# Patient Record
Sex: Female | Born: 1965 | Race: Black or African American | Hispanic: No | Smoking: Never smoker
Health system: Southern US, Community
[De-identification: ages and names within clinical notes are randomized; demographics above are authoritative.]

## PROBLEM LIST (undated history)

## (undated) DIAGNOSIS — K589 Irritable bowel syndrome without diarrhea: Secondary | ICD-10-CM

## (undated) DIAGNOSIS — IMO0002 Reserved for concepts with insufficient information to code with codable children: Secondary | ICD-10-CM

## (undated) DIAGNOSIS — M329 Systemic lupus erythematosus, unspecified: Secondary | ICD-10-CM

## (undated) DIAGNOSIS — K219 Gastro-esophageal reflux disease without esophagitis: Secondary | ICD-10-CM

## (undated) DIAGNOSIS — D649 Anemia, unspecified: Secondary | ICD-10-CM

## (undated) DIAGNOSIS — M3500 Sicca syndrome, unspecified: Secondary | ICD-10-CM

## (undated) DIAGNOSIS — I7300 Raynaud's syndrome without gangrene: Secondary | ICD-10-CM

## (undated) DIAGNOSIS — F419 Anxiety disorder, unspecified: Secondary | ICD-10-CM

## (undated) DIAGNOSIS — B019 Varicella without complication: Secondary | ICD-10-CM

## (undated) HISTORY — DX: Anxiety disorder, unspecified: F41.9

## (undated) HISTORY — DX: Gastro-esophageal reflux disease without esophagitis: K21.9

## (undated) HISTORY — DX: Sjogren syndrome, unspecified: M35.00

## (undated) HISTORY — PX: BUNIONECTOMY: SHX129

## (undated) HISTORY — DX: Anemia, unspecified: D64.9

## (undated) HISTORY — DX: Raynaud's syndrome without gangrene: I73.00

## (undated) HISTORY — DX: Varicella without complication: B01.9

## (undated) HISTORY — DX: Reserved for concepts with insufficient information to code with codable children: IMO0002

## (undated) HISTORY — DX: Systemic lupus erythematosus, unspecified: M32.9

## (undated) HISTORY — DX: Irritable bowel syndrome, unspecified: K58.9

---

## 1997-08-01 ENCOUNTER — Other Ambulatory Visit: Admission: RE | Admit: 1997-08-01 | Discharge: 1997-08-01 | Payer: Self-pay | Admitting: Obstetrics and Gynecology

## 1997-08-29 ENCOUNTER — Ambulatory Visit (HOSPITAL_COMMUNITY): Admission: RE | Admit: 1997-08-29 | Discharge: 1997-08-29 | Payer: Self-pay | Admitting: Obstetrics and Gynecology

## 1997-11-14 ENCOUNTER — Other Ambulatory Visit: Admission: RE | Admit: 1997-11-14 | Discharge: 1997-11-14 | Payer: Self-pay | Admitting: Obstetrics and Gynecology

## 1998-01-22 ENCOUNTER — Inpatient Hospital Stay (HOSPITAL_COMMUNITY): Admission: AD | Admit: 1998-01-22 | Discharge: 1998-01-24 | Payer: Self-pay | Admitting: Obstetrics & Gynecology

## 1998-01-22 ENCOUNTER — Inpatient Hospital Stay (HOSPITAL_COMMUNITY): Admission: AD | Admit: 1998-01-22 | Discharge: 1998-01-22 | Payer: Self-pay | Admitting: Obstetrics and Gynecology

## 2000-12-04 ENCOUNTER — Encounter: Admission: RE | Admit: 2000-12-04 | Discharge: 2000-12-04 | Payer: Self-pay | Admitting: Family Medicine

## 2001-05-23 ENCOUNTER — Other Ambulatory Visit: Admission: RE | Admit: 2001-05-23 | Discharge: 2001-05-23 | Payer: Self-pay | Admitting: Family Medicine

## 2003-04-16 ENCOUNTER — Encounter: Admission: RE | Admit: 2003-04-16 | Discharge: 2003-04-16 | Payer: Self-pay | Admitting: Family Medicine

## 2004-04-18 DIAGNOSIS — M3500 Sicca syndrome, unspecified: Secondary | ICD-10-CM | POA: Insufficient documentation

## 2004-04-18 DIAGNOSIS — M329 Systemic lupus erythematosus, unspecified: Secondary | ICD-10-CM | POA: Insufficient documentation

## 2004-04-18 DIAGNOSIS — I7300 Raynaud's syndrome without gangrene: Secondary | ICD-10-CM | POA: Insufficient documentation

## 2004-04-18 HISTORY — PX: CHOLECYSTECTOMY: SHX55

## 2004-08-04 ENCOUNTER — Other Ambulatory Visit: Admission: RE | Admit: 2004-08-04 | Discharge: 2004-08-04 | Payer: Self-pay | Admitting: *Deleted

## 2004-09-17 ENCOUNTER — Encounter: Admission: RE | Admit: 2004-09-17 | Discharge: 2004-09-17 | Payer: Self-pay | Admitting: Family Medicine

## 2005-02-11 ENCOUNTER — Encounter (INDEPENDENT_AMBULATORY_CARE_PROVIDER_SITE_OTHER): Payer: Self-pay | Admitting: *Deleted

## 2005-02-11 ENCOUNTER — Ambulatory Visit (HOSPITAL_COMMUNITY): Admission: RE | Admit: 2005-02-11 | Discharge: 2005-02-12 | Payer: Self-pay | Admitting: General Surgery

## 2006-08-31 ENCOUNTER — Other Ambulatory Visit: Admission: RE | Admit: 2006-08-31 | Discharge: 2006-08-31 | Payer: Self-pay | Admitting: Family Medicine

## 2007-04-19 DIAGNOSIS — G4700 Insomnia, unspecified: Secondary | ICD-10-CM | POA: Insufficient documentation

## 2007-11-29 ENCOUNTER — Emergency Department (HOSPITAL_COMMUNITY): Admission: EM | Admit: 2007-11-29 | Discharge: 2007-11-29 | Payer: Self-pay | Admitting: Internal Medicine

## 2007-12-06 ENCOUNTER — Emergency Department (HOSPITAL_COMMUNITY): Admission: EM | Admit: 2007-12-06 | Discharge: 2007-12-07 | Payer: Self-pay | Admitting: Family Medicine

## 2008-03-05 ENCOUNTER — Other Ambulatory Visit: Admission: RE | Admit: 2008-03-05 | Discharge: 2008-03-05 | Payer: Self-pay | Admitting: Family Medicine

## 2008-03-19 ENCOUNTER — Encounter: Admission: RE | Admit: 2008-03-19 | Discharge: 2008-03-19 | Payer: Self-pay | Admitting: Family Medicine

## 2008-10-10 ENCOUNTER — Emergency Department (HOSPITAL_COMMUNITY): Admission: EM | Admit: 2008-10-10 | Discharge: 2008-10-10 | Payer: Self-pay | Admitting: Emergency Medicine

## 2009-04-24 ENCOUNTER — Encounter: Admission: RE | Admit: 2009-04-24 | Discharge: 2009-04-24 | Payer: Self-pay | Admitting: Family Medicine

## 2009-06-09 ENCOUNTER — Other Ambulatory Visit: Admission: RE | Admit: 2009-06-09 | Discharge: 2009-06-09 | Payer: Self-pay | Admitting: Family Medicine

## 2010-05-09 ENCOUNTER — Encounter: Payer: Self-pay | Admitting: Family Medicine

## 2010-05-14 ENCOUNTER — Encounter
Admission: RE | Admit: 2010-05-14 | Discharge: 2010-05-14 | Payer: Self-pay | Source: Home / Self Care | Attending: Family Medicine | Admitting: Family Medicine

## 2010-06-02 ENCOUNTER — Inpatient Hospital Stay (INDEPENDENT_AMBULATORY_CARE_PROVIDER_SITE_OTHER)
Admission: RE | Admit: 2010-06-02 | Discharge: 2010-06-02 | Disposition: A | Discharge status: Home / Self Care | Payer: Self-pay | Source: Ambulatory Visit | Attending: Emergency Medicine | Admitting: Emergency Medicine

## 2010-06-02 DIAGNOSIS — J069 Acute upper respiratory infection, unspecified: Secondary | ICD-10-CM

## 2010-06-23 ENCOUNTER — Other Ambulatory Visit (HOSPITAL_COMMUNITY)
Admission: RE | Admit: 2010-06-23 | Discharge: 2010-06-23 | Disposition: A | Discharge status: Home / Self Care | Payer: 59 | Source: Ambulatory Visit | Attending: Family Medicine | Admitting: Family Medicine

## 2010-06-23 ENCOUNTER — Other Ambulatory Visit: Payer: Self-pay | Admitting: Family Medicine

## 2010-06-23 DIAGNOSIS — Z01419 Encounter for gynecological examination (general) (routine) without abnormal findings: Secondary | ICD-10-CM | POA: Insufficient documentation

## 2010-06-23 DIAGNOSIS — Z113 Encounter for screening for infections with a predominantly sexual mode of transmission: Secondary | ICD-10-CM | POA: Insufficient documentation

## 2010-06-24 ENCOUNTER — Other Ambulatory Visit: Payer: Self-pay | Admitting: Family Medicine

## 2010-06-24 DIAGNOSIS — R52 Pain, unspecified: Secondary | ICD-10-CM

## 2010-06-28 ENCOUNTER — Other Ambulatory Visit: Payer: Commercial Managed Care - PPO

## 2010-07-01 ENCOUNTER — Ambulatory Visit
Admission: RE | Admit: 2010-07-01 | Discharge: 2010-07-01 | Disposition: A | Discharge status: Home / Self Care | Payer: Commercial Managed Care - PPO | Source: Ambulatory Visit | Attending: Family Medicine | Admitting: Family Medicine

## 2010-07-01 DIAGNOSIS — R52 Pain, unspecified: Secondary | ICD-10-CM

## 2010-09-03 NOTE — Op Note (Signed)
NAME:  Cheryl Williams, Cheryl Williams         ACCOUNT NO.:  [id number]   MEDICAL RECORD NO.:  [id number]          PATIENT TYPE:  OIB   LOCATION:  2550                         FACILITY:  [facility]   PHYSICIAN:  Angelia Mould. Derrell Lolling, M.D.DATE OF BIRTH:  21-Mar-1966   DATE OF PROCEDURE:  02/11/2005  DATE OF DISCHARGE:                                 OPERATIVE REPORT   PREOPERATIVE DIAGNOSIS:  Chronic cholecystitis with cholelithiasis.   POSTOPERATIVE DIAGNOSIS:  Chronic cholecystitis with cholelithiasis.   OPERATION PERFORMED:  Laparoscopic cholecystectomy with intraoperative  cholangiogram.   SURGEON:  Angelia Mould. Derrell Lolling, M.D.   FIRST ASSISTANT:  Anselm Pancoast. Zachery Dakins, M.D.   OPERATIVE INDICATIONS:  This is a 45 year old black female who enjoys good  health.  She has a long history of indigestion and a several-month history  of intermittent episodes of epigastric and right upper quadrant pain  radiating to the back with nausea.  Ultrasound shows numerous gallstones.  She was counseled as an outpatient and offered elective cholecystectomy.  She is brought to the operating room electively.   OPERATIVE FINDINGS:  The gallbladder was packed with numerous small round  yellow stones.  The cholangiogram was normal.  Specifically, there was no  intrahepatic or extrahepatic dilatation, no filling defects, the anatomy was  normal and there was no obstruction of flow of contrast into the duodenum.  The liver, stomach, duodenum, small intestine, large intestine and  peritoneal surfaces were normal to inspection.   OPERATIVE TECHNIQUE:  Following the induction of general endotracheal  anesthesia, the patient's abdomen was prepped and draped in a sterile  fashion.  A 10-mm Hassan trocar was inserted through a vertical incision  just below the umbilicus, sutured in place with a pursestring suture of 0  Vicryl and pneumoperitoneum created.  Video camera was inserted with  visualization and findings as described  above.  A 10-mm trocar was placed in  the subxiphoid region and two 5-mm trocars placed in the right mid-abdomen.  The gallbladder was elevated.  A few adhesions were taken down.  A large  lymph node at the lower infundibulum was debrided away.  We dissected out  the cystic duct and the cystic artery.  We isolated the cystic artery as it  went under the gallbladder wall, secured it with multiple metal clips and  divided it.  We then created a large window behind the cystic duct.  We  placed a clip on the cystic duct close to the gallbladder.  A cholangiogram  catheter was inserted into the cystic duct.  A cholangiogram was obtained  using the C-arm.  The intrahepatic and extrahepatic biliary anatomy was  normal.  There is no filling defect and no obstruction with good flow of  contrast into the duodenum.  The cholangiogram catheter was removed and the  cystic duct secured with multiple metal clips and divided.  The gallbladder  was dissected from its bed with electrocautery and placed in the specimen  bag and removed.  We then make 1 small hole in the gallbladder; we spilled a  few tiny stones, but very quickly retrieved these.   After removing the gallbladder, we  irrigated out the subphrenic space and  subphrenic space with about 2000 mL of saline using the 10-mm sucker tip to  gather any residual stones.  At the completion of the case, the irrigation  fluid was completely clear, there were no stones visible anywhere and there  was no evidence of any bleeding or bile leak whatsoever.  The  pneumoperitoneum was released.  The trocars were removed under direct vision  and there was no bleeding from the trocar sites.  The fascia at the  umbilicus was closed with 0 Vicryl suture.  The skin incision was closed  with subcuticular sutures of 4-0 Monocryl and Steri-Strips.  The 2 lateral 5-  mm port sites had a little bit of bleeding so I closed these with 4-0 nylon  sutures.  Clean bandages were  placed and the patient taken to the recovery  room in stable condition.  Estimated blood loss was about 10 mL.  Complications -- none.  Sponge and instrument counts were correct.      Angelia Mould. Derrell Lolling, M.D.  Electronically Signed     HMI/MEDQ  D:  02/11/2005  T:  02/11/2005  Job:  [id number]   cc:   Fayrene Fearing L. Malon Kindle., M.D.  Fax: [phone]   [facility], Elnita Maxwell FNP

## 2011-01-14 LAB — POCT RAPID STREP A: Streptococcus, Group A Screen (Direct): NEGATIVE

## 2011-04-14 ENCOUNTER — Other Ambulatory Visit: Payer: Self-pay | Admitting: Family Medicine

## 2011-04-14 DIAGNOSIS — Z1231 Encounter for screening mammogram for malignant neoplasm of breast: Secondary | ICD-10-CM

## 2011-05-16 ENCOUNTER — Ambulatory Visit
Admission: RE | Admit: 2011-05-16 | Discharge: 2011-05-16 | Disposition: A | Discharge status: Home / Self Care | Payer: 59 | Source: Ambulatory Visit | Attending: Family Medicine | Admitting: Family Medicine

## 2011-05-16 DIAGNOSIS — Z1231 Encounter for screening mammogram for malignant neoplasm of breast: Secondary | ICD-10-CM

## 2012-03-08 ENCOUNTER — Ambulatory Visit (INDEPENDENT_AMBULATORY_CARE_PROVIDER_SITE_OTHER): Payer: Self-pay | Admitting: Pharmacist

## 2012-03-08 ENCOUNTER — Encounter: Payer: Self-pay | Admitting: Pharmacist

## 2012-03-08 VITALS — BP 111/72 | HR 96 | Ht 68.0 in | Wt 144.2 lb

## 2012-03-08 DIAGNOSIS — M3500 Sicca syndrome, unspecified: Secondary | ICD-10-CM

## 2012-03-08 DIAGNOSIS — M329 Systemic lupus erythematosus, unspecified: Secondary | ICD-10-CM

## 2012-03-08 DIAGNOSIS — I7300 Raynaud's syndrome without gangrene: Secondary | ICD-10-CM

## 2012-03-08 DIAGNOSIS — G4700 Insomnia, unspecified: Secondary | ICD-10-CM

## 2012-03-08 NOTE — Assessment & Plan Note (Signed)
Following medication review, no suggestions for change.  Complete medication list provided to patient.  Total time in face to face medication review: 15 minutes.  Patient seen with:Bola Star Age PharmD, Pharmacy Resident and Marthann Schiller, Medical student

## 2012-03-08 NOTE — Progress Notes (Signed)
  Subjective:    Patient ID: Cheryl Williams, female    DOB: 11/25/1965, 46 y.o.   MRN: [id number]  HPI  Patient arrives in good spirits for medication review.   Reports seeing Adaku Nnodi at [facility] as primary care provider,[facility] as rheumatologist. Reports being diagnosed with  SLE, Raynaud's disease and Sjrogens disease in 04/18/2004, and insomnia in 04/19/2007. Patient to be started on Benlysta for SLE.  Review of Systems     Objective:   Physical Exam        Assessment & Plan:  Following medication review, no suggestions for change.  Complete medication list provided to patient.  Total time in face to face medication review: 15 minutes.  Patient seen with:Bola Star Age PharmD, Pharmacy Resident and Marthann Schiller, Medical student

## 2012-03-08 NOTE — Patient Instructions (Addendum)
Thank you for coming in

## 2012-03-08 NOTE — Progress Notes (Signed)
Patient ID: Cheryl Williams, female   DOB: 03/23/1966, 45 y.o.   MRN: [id number] Reviewed and agree with Dr. Macky Lower documentation and management.

## 2012-04-13 ENCOUNTER — Other Ambulatory Visit: Payer: Self-pay | Admitting: Family Medicine

## 2012-04-13 DIAGNOSIS — Z1231 Encounter for screening mammogram for malignant neoplasm of breast: Secondary | ICD-10-CM

## 2012-05-16 ENCOUNTER — Ambulatory Visit: Payer: 59

## 2012-06-12 ENCOUNTER — Ambulatory Visit: Payer: 59

## 2012-07-04 ENCOUNTER — Ambulatory Visit
Admission: RE | Admit: 2012-07-04 | Discharge: 2012-07-04 | Disposition: A | Discharge status: Home / Self Care | Payer: 59 | Source: Ambulatory Visit | Attending: Family Medicine | Admitting: Family Medicine

## 2012-07-04 DIAGNOSIS — Z1231 Encounter for screening mammogram for malignant neoplasm of breast: Secondary | ICD-10-CM

## 2012-09-17 ENCOUNTER — Ambulatory Visit (INDEPENDENT_AMBULATORY_CARE_PROVIDER_SITE_OTHER): Payer: 59 | Admitting: Sports Medicine

## 2012-09-17 VITALS — BP 94/60 | Ht 68.0 in | Wt 145.0 lb

## 2012-09-17 DIAGNOSIS — M25539 Pain in unspecified wrist: Secondary | ICD-10-CM

## 2012-09-17 DIAGNOSIS — M25532 Pain in left wrist: Secondary | ICD-10-CM | POA: Insufficient documentation

## 2012-09-17 MED ORDER — IBUPROFEN 600 MG PO TABS: ORAL_TABLET | ORAL | Status: DC

## 2012-09-17 NOTE — Progress Notes (Signed)
  Subjective:    Patient ID: Cheryl Williams, female    DOB: Aug 27, 1965, 47 y.o.   MRN: [id number]  Chief Complaint: left wrist pain HPI 47 yo female (right handed) with h/o lupus and Raynaud's who presents with 1 day history of left wrist pain. Patient reports dull ache around ulnar aspect of wrist yesterday morning, which gradually became worst as the day progressed. Pain was associated with swelling over wrist area. Patient denies any trauma or injury to the wrist over the weekend. Pain is a constant ache, worst on the ulnar aspect of the wrist, with pain radiating up to the medial elbow. She reports difficulty moving her fingers and wrist due to the pain. She reports tingling and numbness in all digits including the thumb. Pain did wake her up from sleep last night. She has taken ibuprofen 600mg  every 6 hours which has helped with the pain and swelling.   She has had some off and on achiness to the left wrist for a while, with numbness and tingling after periods of sleeping on her wrist, but this episode is acutely worst. She also had an episode of right wrist and thumb pain 2-3 weeks ago, which resolved with ibuprofen and a SPICA splint.   Review of Systems Negative except per HPI.  Social: denies tobacco use. Occasional alcohol use. No drug use. Works as an Charity fundraiser at [employer].     Objective:   Physical Exam  General: alert and oriented x4, no acute distress Left wrist: diffuse soft tissue swelling over vollar aspect of wrist joint. Limited range of motion. Mild tenderness to palpation over vollar aspect of wrist.  Negative Tinnel's and Phallen's. Neurovascularly intact.   Ultrasound evaluation of left wrist:  Transverse view: hypoechoic changes in carpal tunnel. No obvious joint effusion.      Assessment & Plan:  Left wrist pain likely from carpal tunnel swelling, possibly from lupus etiology.   - treat inflammation with ibuprofen 600mg  tid with meals. If not improved in 2-3 days,  consider short steroid burst.  - wrist splint - follow up in 4 days.

## 2012-09-21 ENCOUNTER — Ambulatory Visit: Payer: 59 | Admitting: Sports Medicine

## 2013-02-01 ENCOUNTER — Other Ambulatory Visit: Payer: Self-pay | Admitting: Sports Medicine

## 2013-06-05 ENCOUNTER — Other Ambulatory Visit: Payer: Self-pay

## 2013-06-05 DIAGNOSIS — Z1231 Encounter for screening mammogram for malignant neoplasm of breast: Secondary | ICD-10-CM

## 2013-07-01 ENCOUNTER — Other Ambulatory Visit (HOSPITAL_COMMUNITY)
Admission: RE | Admit: 2013-07-01 | Discharge: 2013-07-01 | Disposition: A | Discharge status: Home / Self Care | Payer: 59 | Source: Ambulatory Visit | Attending: Family Medicine | Admitting: Family Medicine

## 2013-07-01 ENCOUNTER — Other Ambulatory Visit: Payer: Self-pay | Admitting: Family Medicine

## 2013-07-01 DIAGNOSIS — Z1151 Encounter for screening for human papillomavirus (HPV): Secondary | ICD-10-CM | POA: Insufficient documentation

## 2013-07-01 DIAGNOSIS — Z124 Encounter for screening for malignant neoplasm of cervix: Secondary | ICD-10-CM | POA: Insufficient documentation

## 2013-07-05 ENCOUNTER — Inpatient Hospital Stay: Admission: RE | Admit: 2013-07-05 | Payer: 59 | Source: Ambulatory Visit

## 2013-07-17 ENCOUNTER — Ambulatory Visit: Payer: 59

## 2013-09-19 ENCOUNTER — Ambulatory Visit
Admission: RE | Admit: 2013-09-19 | Discharge: 2013-09-19 | Disposition: A | Discharge status: Home / Self Care | Payer: 59 | Source: Ambulatory Visit

## 2013-09-19 DIAGNOSIS — Z1231 Encounter for screening mammogram for malignant neoplasm of breast: Secondary | ICD-10-CM

## 2013-11-20 ENCOUNTER — Ambulatory Visit (INDEPENDENT_AMBULATORY_CARE_PROVIDER_SITE_OTHER): Payer: 59

## 2013-11-20 ENCOUNTER — Ambulatory Visit: Payer: 59

## 2013-11-20 ENCOUNTER — Encounter: Payer: Self-pay | Admitting: Podiatrist

## 2013-11-20 ENCOUNTER — Ambulatory Visit (INDEPENDENT_AMBULATORY_CARE_PROVIDER_SITE_OTHER): Payer: 59 | Admitting: Podiatrist

## 2013-11-20 VITALS — BP 104/71 | HR 92 | Resp 18

## 2013-11-20 DIAGNOSIS — M2011 Hallux valgus (acquired), right foot: Secondary | ICD-10-CM

## 2013-11-20 DIAGNOSIS — R52 Pain, unspecified: Secondary | ICD-10-CM

## 2013-11-20 DIAGNOSIS — M722 Plantar fascial fibromatosis: Secondary | ICD-10-CM

## 2013-11-20 DIAGNOSIS — M2010 Hallux valgus (acquired), unspecified foot: Secondary | ICD-10-CM

## 2013-11-20 NOTE — Patient Instructions (Signed)
Plantar Fasciitis (Heel Spur Syndrome) with Rehab The plantar fascia is a fibrous, ligament-like, soft-tissue structure that spans the bottom of the foot. Plantar fasciitis is a condition that causes pain in the foot due to inflammation of the tissue. SYMPTOMS   Pain and tenderness on the underneath side of the foot.  Pain that worsens with standing or walking. CAUSES  Plantar fasciitis is caused by irritation and injury to the plantar fascia on the underneath side of the foot. Common mechanisms of injury include:  Direct trauma to bottom of the foot.  Damage to a small nerve that runs under the foot where the main fascia attaches to the heel bone. Stress placed on the plantar fascia due to any mild increased activity or injury RISK INCREASES WITH:   Obesity.  Poor strength and flexibility.  Improperly fitted shoes.  Tight calf muscles.  Flat feet.  Failure to warm-up properly before activity.  PREVENTION  Warm up and stretch properly before activity.  Strength, flexibility  Maintain a health body weight.  Avoid stress on the plantar fascia.  Wear properly fitted shoes, including arch supports for individuals who have flat feet. PROGNOSIS  If treated properly, then the symptoms of plantar fasciitis usually resolve without surgery. However, occasionally surgery is necessary. RELATED COMPLICATIONS   Recurrent symptoms that may result in a chronic condition.  Problems of the lower back that are caused by compensating for the injury, such as limping.  Pain or weakness of the foot during push-off following surgery.  Chronic inflammation, scarring, and partial or complete fascia tear, occurring more often from repeated injections. TREATMENT  Treatment initially involves the use of ice and medication to help reduce pain and inflammation. The use of strengthening and stretching exercises may help reduce pain with activity, especially stretches of the Achilles tendon.  Your  caregiver may recommend that you use arch supports to help reduce stress on the plantar fascia. Often, corticosteroid injections are given to reduce inflammation. If symptoms persist for greater than 6 months despite non-surgical (conservative), then surgery may be recommended.  MEDICATION   If pain medication is necessary, then nonsteroidal anti-inflammatory medications, such as aspirin and ibuprofen, or other minor pain relievers, such as acetaminophen, are often recommended. Corticosteroid injections may be given by your caregiver.  HEAT AND COLD  Cold treatment (icing) relieves pain and reduces inflammation. Cold treatment should be applied for 10 to 15 minutes every 2 to 3 hours for inflammation and pain and immediately after any activity that aggravates your symptoms. Use ice packs or massage the area with a piece of ice (ice massage).  Heat treatment may be used prior to performing the stretching and strengthening activities prescribed by your caregiver, physical therapist, or athletic trainer. Use a heat pack or soak the injury in warm water. SEEK IMMEDIATE MEDICAL CARE IF:  Treatment seems to offer no benefit, or the condition worsens.  Any medications produce adverse side effects.    EXERCISES-- perform each exercise a total of 10-15 repetitions.  Hold for 30 seconds and perform 3 times per day   RANGE OF MOTION (ROM) AND STRETCHING EXERCISES - Plantar Fasciitis (Heel Spur Syndrome) These exercises may help you when beginning to rehabilitate your injury.   While completing these exercises, remember:   Restoring tissue flexibility helps normal motion to return to the joints. This allows healthier, less painful movement and activity.  An effective stretch should be held for at least 30 seconds.  A stretch should never be painful. You   should only feel a gentle lengthening or release in the stretched tissue. RANGE OF MOTION - Toe Extension, Flexion  Sit with your right / left  leg crossed over your opposite knee.  Grasp your toes and gently pull them back toward the top of your foot. You should feel a stretch on the bottom of your toes and/or foot.  Hold this stretch for __________ seconds.  Now, gently pull your toes toward the bottom of your foot. You should feel a stretch on the top of your toes and or foot.  Hold this stretch for __________ seconds. Repeat __________ times. Complete this stretch __________ times per day.  RANGE OF MOTION - Ankle Dorsiflexion, Active Assisted  Remove shoes and sit on a chair that is preferably not on a carpeted surface.  Place right / left foot under knee. Extend your opposite leg for support.  Keeping your heel down, slide your right / left foot back toward the chair until you feel a stretch at your ankle or calf. If you do not feel a stretch, slide your bottom forward to the edge of the chair, while still keeping your heel down.  Hold this stretch for __________ seconds. Repeat __________ times. Complete this stretch __________ times per day.  STRETCH  Gastroc, Standing  Place hands on wall.  Extend right / left leg, keeping the front knee somewhat bent.  Slightly point your toes inward on your back foot.  Keeping your right / left heel on the floor and your knee straight, shift your weight toward the wall, not allowing your back to arch.  You should feel a gentle stretch in the right / left calf. Hold this position for __________ seconds. Repeat __________ times. Complete this stretch __________ times per day. STRETCH  Soleus, Standing  Place hands on wall.  Extend right / left leg, keeping the other knee somewhat bent.  Slightly point your toes inward on your back foot.  Keep your right / left heel on the floor, bend your back knee, and slightly shift your weight over the back leg so that you feel a gentle stretch deep in your back calf.  Hold this position for __________ seconds. Repeat __________ times.  Complete this stretch __________ times per day. STRETCH  Gastrocsoleus, Standing  Note: This exercise can place a lot of stress on your foot and ankle. Please complete this exercise only if specifically instructed by your caregiver.   Place the ball of your right / left foot on a step, keeping your other foot firmly on the same step.  Hold on to the wall or a rail for balance.  Slowly lift your other foot, allowing your body weight to press your heel down over the edge of the step.  You should feel a stretch in your right / left calf.  Hold this position for __________ seconds.  Repeat this exercise with a slight bend in your right / left knee. Repeat __________ times. Complete this stretch __________ times per day.  STRENGTHENING EXERCISES - Plantar Fasciitis (Heel Spur Syndrome)  These exercises may help you when beginning to rehabilitate your injury. They may resolve your symptoms with or without further involvement from your physician, physical therapist or athletic trainer. While completing these exercises, remember:   Muscles can gain both the endurance and the strength needed for everyday activities through controlled exercises.  Complete these exercises as instructed by your physician, physical therapist or athletic trainer. Progress the resistance and repetitions only as guided.  Bunionectomy  A bunionectomy is surgery to remove a bunion. A bunion is an enlargement of the joint at the base of the big toe. It is made up of bone and soft tissue on the inside part of the joint. Over time, a painful lump appears on the inside of the joint. The big toe begins to point inward toward the second toe. New bone growth can occur and a bone spur may form. The pain eventually causes difficulty walking. A bunion usually results from inflammation caused by the irritation of poorly fitting shoes. It often begins later in life. A bunionectomy is performed when nonsurgical treatment no longer works.  When surgery is needed, the extent of the procedure will depend on the degree of deformity of the foot. Your surgeon will discuss with you the different procedures and what will work best for you depending on your age and health. LET YOUR CAREGIVER KNOW ABOUT:   Previous problems with anesthetics or medicines used to numb the skin.  Allergies to dyes, iodine, foods, and/or latex.  Medicines taken including herbs, eye drops, prescription medicines (especially medicines used to "thin the blood"), aspirin and other over-the-counter medicines, and steroids (by mouth or as a cream).  History of bleeding or blood problems.  Possibility of pregnancy, if this applies.  History of blood clots in your legs and/or lungs .  Previous surgery.  Other important health problems. RISKS AND COMPLICATIONS   Infection.  Pain.  Nerve damage.  Possibility that the bunion will recur. BEFORE THE PROCEDURE  You should be present 60 minutes prior to your procedure or as directed.  PROCEDURE  Surgery is often done so that you can go home the same day (outpatient). It may be done in a hospital or in an outpatient surgical center. An anesthetic will be used to help you sleep during the procedure. Sometimes, a spinal anesthetic is used to make you numb below the waist. A cut (incision) is made over the swollen area at the first joint of the big toe. The enlarged lump will be removed. If there is a need to reposition the bones of the big toe, this may require more than 1 incision. The bone itself may need to be cut. Screws and wires may be used in the repair. These can be removed at a later date. In severe cases, the entire joint may need to be removed and a joint replacement inserted. When done, the incision is closed with stitches (sutures). Skin adhesive strips may be added for reinforcement. They help hold the incision closed.  AFTER THE PROCEDURE  Compression bandages (dressings) are then wrapped around the  wound. This helps to keep the foot in alignment and reduce swelling. Your foot will be monitored for bleeding and swelling. You will need to stay for a few hours in the recovery area before being discharged. This allows time for the anesthesia to wear off. You will be discharged home when you are awake, stable, and doing well. HOME CARE INSTRUCTIONS   You can expect to return to normal activities within 6 to 8 weeks after surgery. The foot is at increased risk for swelling for several months. When you can expect to bear weight on the operated foot will depend on the extent of your surgery. The milder the deformity, the less tissue is removed and the sooner the return to normal activity level. During the recovery period, a special shoe, boot, or cast may be worn to accommodate the surgical bandage and to help provide stability  to the foot.  Once you are home, an ice pack applied to the operative site may help with discomfort and keep swelling down. Stop using the ice if it causes discomfort.  Keep your feet raised (elevated) when possible to lessen swelling.  If you have an elastic bandage on your foot and you have numbness, tingling, or your foot becomes cold and blue, adjust the bandage to make it comfortable.  Change dressings as directed.  Keep the wound dry and clean. The wound may be washed gently with soap and water. Gently blot dry without rubbing. Do not take baths or use swimming pools or hot tubs for 10 days, or as instructed by your caregiver.  Only take over-the-counter or prescription medicines for pain, discomfort, or fever as directed by your caregiver.  You may continue a normal diet as directed.  For activity, use crutches with no weight bearing or your orthopedic shoe as directed. Continue to use crutches or a cane as directed until you can stand without causing pain. SEEK MEDICAL CARE IF:   You have redness, swelling, bruising, or increasing pain in the wound.  There is pus  coming from the wound.  You have drainage from a wound lasting longer than 1 day.  You have an oral temperature above 102 F (38.9 C).  You notice a bad smell coming from the wound or dressing.  The wound breaks open after sutures have been removed.  You develop dizzy episodes or fainting while standing.  You have persistent nausea or vomiting.  Your toes become cold.  Pain is not relieved with medicines. SEEK IMMEDIATE MEDICAL CARE IF:   You develop a rash.  You have difficulty breathing.  You develop any reaction or side effects to medicines given.  Your toes are numb or blue, or you have severe pain. MAKE SURE YOU:   Understand these instructions.  Will watch your condition.  Will get help right away if you are not doing well or get worse. Document Released: 03/18/2005 Document Revised: 06/27/2011 Document Reviewed: 04/23/2007 [facility] Patient Information 2015 [place], [place]. This information is not intended to replace advice given to you by your health care provider. Make sure you discuss any questions you have with your health care provider.

## 2013-11-20 NOTE — Progress Notes (Signed)
   Subjective:    Patient ID: Cheryl PersiaJeannette A Williams, female    DOB: 1965/11/20, 48 y.o.   MRN: [id number]  HPI right foot has a bunion on it and has been throbbing and sore and burning and hurts with walking    Review of Systems  Eyes: Positive for redness.  Gastrointestinal: Positive for diarrhea.       IBS  Endocrine: Positive for cold intolerance.  Musculoskeletal:       Joint and muscle pain  All other systems reviewed and are negative.      Objective:   Physical Exam  Patient is awake, alert, and oriented x 3.  In no acute distress.  Vascular status is intact with palpable pedal pulses at 2/4 DP and PT bilateral and capillary refill time within normal limits. Neurological sensation is also intact bilaterally via Semmes Weinstein monofilament at 5/5 sites. Light touch, vibratory sensation, Achilles tendon reflex is intact. Dermatological exam reveals skin color, turger and texture as normal. No open lesions present.  Musculature intact with dorsiflexion, plantarflexion, inversion, eversion.  Low arch foot type with long arch is noted. Pain on palpation plantar medial aspect of the right heel is palpated. Mild to moderate bunion deformity is also present right with good range of motion noted. X-rays confirm mild bunion deformity right.     Assessment & Plan:  Plantar  fasciitis, bunion right foot  Plan: Discussed anti-inflammatory medication for the plantar fasciitis which she is are taken and states helped significantly. Dispensed stretching instructions. Also discussed orthotics and she was scanned at today's visit. We discussed briefly bunion surgery however the pain has not bothered her for long enough to consider the surgery at this point if she reconsider she will call.

## 2014-01-01 ENCOUNTER — Other Ambulatory Visit (INDEPENDENT_AMBULATORY_CARE_PROVIDER_SITE_OTHER): Payer: 59 | Admitting: Family Medicine

## 2014-01-01 ENCOUNTER — Other Ambulatory Visit: Payer: Self-pay | Admitting: Family Medicine

## 2014-01-01 DIAGNOSIS — R350 Frequency of micturition: Secondary | ICD-10-CM

## 2014-01-01 LAB — POCT URINALYSIS DIPSTICK
Bilirubin, UA: NEGATIVE
GLUCOSE UA: NEGATIVE
KETONES UA: NEGATIVE
Nitrite, UA: NEGATIVE
Protein, UA: 30
SPEC GRAV UA: 1.025
Urobilinogen, UA: 0.2
pH, UA: 7

## 2014-01-01 LAB — POCT UA - MICROSCOPIC ONLY

## 2014-01-01 MED ORDER — SULFAMETHOXAZOLE-TMP DS 800-160 MG PO TABS: 1.0000 | ORAL_TABLET | Freq: Two times a day (BID) | ORAL | Status: DC

## 2014-01-01 NOTE — Addendum Note (Signed)
Addended by: [place], Tarin Johndrow on: 01/01/2014 05:19 PM   Modules accepted: Orders

## 2014-01-04 LAB — URINE CULTURE: Colony Count: >=100000

## 2014-06-02 ENCOUNTER — Telehealth: Payer: 59 | Admitting: Nurse Practitioner

## 2014-06-02 DIAGNOSIS — N3000 Acute cystitis without hematuria: Secondary | ICD-10-CM

## 2014-06-02 MED ORDER — SULFAMETHOXAZOLE-TRIMETHOPRIM 800-160 MG PO TABS: 1.0000 | ORAL_TABLET | Freq: Two times a day (BID) | ORAL | Status: DC

## 2014-06-02 NOTE — Progress Notes (Signed)

## 2014-07-17 ENCOUNTER — Other Ambulatory Visit: Payer: Self-pay | Admitting: *Deleted

## 2014-07-17 ENCOUNTER — Other Ambulatory Visit: Payer: Self-pay | Admitting: Sports Medicine

## 2014-09-30 ENCOUNTER — Telehealth: Payer: 59 | Admitting: Family

## 2014-09-30 DIAGNOSIS — A609 Anogenital herpesviral infection, unspecified: Secondary | ICD-10-CM

## 2014-09-30 MED ORDER — VALACYCLOVIR HCL 1 G PO TABS: 500.0000 mg | ORAL_TABLET | Freq: Two times a day (BID) | ORAL | Status: DC

## 2014-09-30 NOTE — Progress Notes (Signed)
We are sorry that you are not feeling well. Here is how we plan to help! Based on what you shared with me it looks like you are:  . Having a sexually transmitted infection  Based on your reported history of treatment for HSV and responding well to Valtex, Your treatment plan is Valtrex 500mg  twice daily for 3 days for recurrent outbreaks.   Be sure to take all of the medication as directed. Stop taking any medication if you develop a rash, tongue swelling or shortness of breath. Mothers who are breast feeding should consider pumping and discarding their breast milk while on these antibiotics. However, there is no consensus that infant exposure at these doses would be harmful.  Remember that medication creams can weaken latex condoms. Marland Kitchen   HOME CARE:  Good hygiene may prevent some types of vaginosis from recurring and may relieve some symptoms:  . Avoid baths, hot tubs and whirlpool spas. Rinse soap from your outer genital area after a shower, and dry the area well to prevent irritation. Don't use scented or harsh soaps, such as those with deodorant or antibacterial action. Marland Kitchen Avoid irritants. These include scented tampons and pads. . Wipe from front to back after using the toilet. Doing so avoids spreading fecal bacteria to your vagina.  Other things that may help prevent vaginosis include:  Marland Kitchen Don't douche. Your vagina doesn't require cleansing other than normal bathing. Repetitive douching disrupts the normal organisms that reside in the vagina and can actually increase your risk of vaginal infection. Douching won't clear up a vaginal infection. . Use a latex condom. Both female and female latex condoms may help you avoid infections spread by sexual contact. . Wear cotton underwear. Also wear pantyhose with a cotton crotch. If you feel comfortable without it, skip wearing underwear to bed. Yeast thrives in [employer] Your symptoms should improve in the next day or two.  GET HELP RIGHT  AWAY IF:  . You have pain in your lower abdomen ( pelvic area or over your ovaries) . You develop nausea or vomiting . You develop a fever . Your discharge changes or worsens . You have persistent pain with intercourse . You develop shortness of breath, a rapid pulse, or you faint.  These symptoms could be signs of problems or infections that need to be evaluated by a medical provider now.  MAKE SURE YOU    Understand these instructions.  Will watch your condition.  Will get help right away if you are not doing well or get worse.   Your e-visit answers were reviewed by a board certified advanced clinical practitioner to complete your personal care plan. Depending upon the condition, your plan could have included both over the counter or prescription medications. Please review your pharmacy choice to make sure that you have choses a pharmacy that is open for you to pick up any needed prescription, Your safety is important to [place]. If you have drug allergies check your prescription carefully.  You can use MyChart to ask questions about today's visit, request a non-urgent call back, or ask for a work or school excuse. You will get a MyChart message within the next two days asking about your experience. I hope that your e-visit has been valuable and will speed your recovery.

## 2014-10-03 ENCOUNTER — Encounter: Payer: Self-pay | Admitting: Internal Medicine

## 2014-10-03 ENCOUNTER — Ambulatory Visit (INDEPENDENT_AMBULATORY_CARE_PROVIDER_SITE_OTHER): Payer: 59 | Admitting: Internal Medicine

## 2014-10-03 VITALS — BP 104/76 | HR 97 | Temp 98.3°F | Ht 67.0 in | Wt 149.0 lb

## 2014-10-03 DIAGNOSIS — B009 Herpesviral infection, unspecified: Secondary | ICD-10-CM | POA: Insufficient documentation

## 2014-10-03 DIAGNOSIS — K219 Gastro-esophageal reflux disease without esophagitis: Secondary | ICD-10-CM | POA: Insufficient documentation

## 2014-10-03 DIAGNOSIS — F411 Generalized anxiety disorder: Secondary | ICD-10-CM | POA: Insufficient documentation

## 2014-10-03 DIAGNOSIS — G4700 Insomnia, unspecified: Secondary | ICD-10-CM

## 2014-10-03 DIAGNOSIS — M329 Systemic lupus erythematosus, unspecified: Secondary | ICD-10-CM

## 2014-10-03 DIAGNOSIS — I7300 Raynaud's syndrome without gangrene: Secondary | ICD-10-CM

## 2014-10-03 DIAGNOSIS — M3500 Sicca syndrome, unspecified: Secondary | ICD-10-CM

## 2014-10-03 MED ORDER — SUVOREXANT 10 MG PO TABS: 0.5000 | ORAL_TABLET | Freq: Every evening | ORAL | Status: DC | PRN

## 2014-10-03 NOTE — Assessment & Plan Note (Signed)
Continue Xanax prn 

## 2014-10-03 NOTE — Assessment & Plan Note (Signed)
Continue to follow with rheumatology Continue Plaquenil

## 2014-10-03 NOTE — Progress Notes (Signed)
HPI  Pt presents to the clinic today to establish care and for management of the conditions listed below. She is transferring care from Dr. Ihor Dow.  Lupus/Sjpogrens: She follows with Dr. Dierdre Forth, rheumatology. She is on Plaquenil once daily. She was not able to take it twice daily because her white blood cell count got too low.  Anxiety: Worse when she travels. She does not like enclosed spaces. She has trouble flying. She only takes the Xanax as needed. She also takes Propanolol as needed prior to a public speech.  GERD: Triggered by acidic foods. She does have Protonix but rarely uses it. She will take Tums as needed for reflux.  Her reflux improved after she had her  gallbladder out.  Insomnia: She has trouble falling asleep. She is unable to turn her mind off. This is most likely related to anxiety and stress. She takes Belsomra only as needed with good relief..  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 She thinks it is secondary to stress from graduate school. Her last outbreak prior to that was 2 years ago. She has Valtrex to use as needed.  Raynauds: Worse in summer, when the temperature is cold inside. She is not medicated for this.  Past Medical History  Diagnosis Date  . Anemia   . Lupus   . GERD (gastroesophageal reflux disease)   . Neuromuscular disorder   . Chicken pox     Current Outpatient Prescriptions  Medication Sig Dispense Refill  . ALPRAZolam (XANAX) 0.5 MG tablet Take 0.5 mg by mouth daily as needed for anxiety. As needed for travel    . etonogestrel-ethinyl estradiol (NUVARING) 0.12-0.015 MG/24HR vaginal ring Place 1 each vaginally every 28 (twenty-eight) days. Insert vaginally and leave in place for 3 consecutive weeks, then remove for 1 week.    . hydroxychloroquine (PLAQUENIL) 200 MG tablet Take by mouth daily.    Marland Kitchen ibuprofen (ADVIL,MOTRIN) 600 MG tablet TAKE 1 TABLET BY MOUTH 3 TIMES DAILY FOR 5 DAYS, THEN AS NEEDED 60 tablet 5  . propranolol  (INDERAL) 10 MG tablet Take 10 mg by mouth daily as needed.    . Suvorexant (BELSOMRA) 10 MG TABS Take 0.5 tablets by mouth at bedtime as needed.     . valACYclovir (VALTREX) 1000 MG tablet Take 0.5 tablets (500 mg total) by mouth 2 (two) times daily. Times 3 days for recurrent outbreaks and 1 tab daily for suppressive therapy. 30 tablet 0   No current facility-administered medications for this visit.    Allergies  Allergen Reactions  . Penicillins      Rash and hives at age 49  . Imuran [Azathioprine] Other (See Comments)    Mental hallucinations    Family History  Problem Relation Age of Onset  . Hyperlipidemia Mother   . Hypertension Mother   . Deafness Mother   . Alcohol abuse Father   . Deafness Father   . Drug abuse Sister   . Hypertension Sister   . Drug abuse Brother   . Rheum arthritis Maternal Aunt   . Lupus Maternal Aunt   . Rheum arthritis Maternal Grandmother   . Hypertension Sister     History   Social History  . Marital Status: Divorced    Spouse Name: N/A  . Number of Children: N/A  . Years of Education: N/A   Occupational History  . Not on file.   Social History Main Topics  . Smoking status: Never Smoker   .  Smokeless tobacco: Never Used  . Alcohol Use: 0.0 oz/week    0 Standard drinks or equivalent per week     Comment: occasional--moderate  . Drug Use: Not on file  . Sexual Activity: Not on file   Other Topics Concern  . Not on file   Social History Narrative    ROS:  Constitutional: Denies fever, malaise, fatigue, headache or abrupt weight changes.  HEENT: Denies eye pain, eye redness, ear pain, ringing in the ears, wax buildup, runny nose, nasal congestion, bloody nose, or sore throat. Respiratory: Denies difficulty breathing, shortness of breath, cough or sputum production.   Cardiovascular: Denies chest pain, chest tightness, palpitations or swelling in the hands or feet.  Gastrointestinal: Denies abdominal pain, bloating,  constipation, diarrhea or blood in the stool.  GU: Denies frequency, urgency, pain with urination, blood in urine, odor or discharge. Musculoskeletal: Denies decrease in range of motion, difficulty with gait, muscle pain or joint pain and swelling.  Skin: Denies redness, rashes, lesions or ulcercations.  Neurological: Denies dizziness, difficulty with memory, difficulty with speech or problems with balance and coordination.  Psych: Denies anxiety, depression, SI/HI.  No other specific complaints in a complete review of systems (except as listed in HPI above).  PE:  BP 104/76 mmHg  Pulse 97  Temp(Src) 98.3 F (36.8 C) (Oral)  Ht  (1.702 m)  Wt 149 lb (67.586 kg)  BMI 23.33 kg/m2  SpO2 98%  LMP 08/25/2014 Wt Readings from Last 3 Encounters:  10/03/14 149 lb (67.586 kg)  09/17/12 145 lb (65.772 kg)  03/08/12 144 lb 3.2 oz (65.409 kg)    General: Appears her stated age, well developed, well nourished in NAD. Skin: Warm, dry and intact. No rashes, lesion or ulcerations noted. Cardiovascular: Normal rate and rhythm. S1,S2 noted.  No murmur, rubs or gallops noted. Cap refill < 3 sec bilaterally. Radial pulses 2+. Pulmonary/Chest: Normal effort and positive vesicular breath sounds. No respiratory distress. No wheezes, rales or ronchi noted.  Neurological: Alert and oriented.  Psychiatric: Mood and affect normal. Behavior is normal. Judgment and thought content normal.    Assessment and Plan:

## 2014-10-03 NOTE — Assessment & Plan Note (Signed)
Will continue to monitor Will start beta blocker if worse

## 2014-10-03 NOTE — Assessment & Plan Note (Signed)
Continue to follow rheumatology Continue Plaquenil

## 2014-10-03 NOTE — Assessment & Plan Note (Signed)
Occasional Continue Tums as needed

## 2014-10-03 NOTE — Progress Notes (Signed)
Pre visit review using our clinic review tool, if applicable. No additional management support is needed unless otherwise documented below in the visit note. 

## 2014-10-03 NOTE — Patient Instructions (Signed)
Generalized Anxiety Disorder Generalized anxiety disorder (GAD) is a mental disorder. It interferes with life functions, including relationships, work, and school. GAD is different from normal anxiety, which everyone experiences at some point in their lives in response to specific life events and activities. Normal anxiety actually helps us prepare for and get through these life events and activities. Normal anxiety goes away after the event or activity is over.  GAD causes anxiety that is not necessarily related to specific events or activities. It also causes excess anxiety in proportion to specific events or activities. The anxiety associated with GAD is also difficult to control. GAD can vary from mild to severe. People with severe GAD can have intense waves of anxiety with physical symptoms (panic attacks).  SYMPTOMS The anxiety and worry associated with GAD are difficult to control. This anxiety and worry are related to many life events and activities and also occur more days than not for 6 months or longer. People with GAD also have three or more of the following symptoms (one or more in children):  Restlessness.   Fatigue.  Difficulty concentrating.   Irritability.  Muscle tension.  Difficulty sleeping or unsatisfying sleep. DIAGNOSIS GAD is diagnosed through an assessment by your health care provider. Your health care provider will ask you questions aboutyour mood,physical symptoms, and events in your life. Your health care provider may ask you about your medical history and use of alcohol or drugs, including prescription medicines. Your health care provider may also do a physical exam and blood tests. Certain medical conditions and the use of certain substances can cause symptoms similar to those associated with GAD. Your health care provider may refer you to a mental health specialist for further evaluation. TREATMENT The following therapies are usually used to treat GAD:    Medication. Antidepressant medication usually is prescribed for long-term daily control. Antianxiety medicines may be added in severe cases, especially when panic attacks occur.   Talk therapy (psychotherapy). Certain types of talk therapy can be helpful in treating GAD by providing support, education, and guidance. A form of talk therapy called cognitive behavioral therapy can teach you healthy ways to think about and react to daily life events and activities.  Stress managementtechniques. These include yoga, meditation, and exercise and can be very helpful when they are practiced regularly. A mental health specialist can help determine which treatment is best for you. Some people see improvement with one therapy. However, other people require a combination of therapies. Document Released: 07/30/2012 Document Revised: 08/19/2013 Document Reviewed: 07/30/2012 Exit[employer] Patient Information 2015 [employer]. This information is not intended to replace advice given to you by your health care provider. Make sure you discuss any questions you have with your health care provider.  

## 2014-10-03 NOTE — Assessment & Plan Note (Signed)
Stable on Belsomra prn RX refilled today

## 2014-10-03 NOTE — Assessment & Plan Note (Signed)
Continue Valtrex prn 

## 2014-10-30 ENCOUNTER — Other Ambulatory Visit: Payer: Self-pay

## 2014-10-30 DIAGNOSIS — Z1231 Encounter for screening mammogram for malignant neoplasm of breast: Secondary | ICD-10-CM

## 2014-11-03 ENCOUNTER — Encounter: Payer: 59 | Admitting: Internal Medicine

## 2014-11-27 ENCOUNTER — Encounter: Payer: 59 | Admitting: Internal Medicine

## 2014-11-28 ENCOUNTER — Other Ambulatory Visit: Payer: Self-pay

## 2014-11-28 MED ORDER — ETONOGESTREL-ETHINYL ESTRADIOL 0.12-0.015 MG/24HR VA RING: 1.0000 | VAGINAL_RING | VAGINAL | Status: DC

## 2014-11-28 NOTE — Telephone Encounter (Signed)
Pt left v/m requesting status of nuvaring refill request that was done earlier in the week bu [facility] outpt pharmacy; pt request refill done today 11/28/14 due to start next nuvaring on 11/30/14. Pt established care on 10/03/14 and has CPX scheduled on 12/04/14 with Nicki Reaper NP.Please advise.

## 2014-12-03 ENCOUNTER — Ambulatory Visit
Admission: RE | Admit: 2014-12-03 | Discharge: 2014-12-03 | Disposition: A | Discharge status: Home / Self Care | Payer: 59 | Source: Ambulatory Visit

## 2014-12-03 DIAGNOSIS — Z1231 Encounter for screening mammogram for malignant neoplasm of breast: Secondary | ICD-10-CM

## 2014-12-04 ENCOUNTER — Ambulatory Visit (INDEPENDENT_AMBULATORY_CARE_PROVIDER_SITE_OTHER): Payer: 59 | Admitting: Internal Medicine

## 2014-12-04 ENCOUNTER — Encounter: Payer: Self-pay | Admitting: Internal Medicine

## 2014-12-04 VITALS — BP 104/72 | HR 109 | Temp 98.4°F | Ht 67.0 in | Wt 151.0 lb

## 2014-12-04 DIAGNOSIS — R7989 Other specified abnormal findings of blood chemistry: Secondary | ICD-10-CM | POA: Diagnosis not present

## 2014-12-04 DIAGNOSIS — Z0000 Encounter for general adult medical examination without abnormal findings: Secondary | ICD-10-CM | POA: Diagnosis not present

## 2014-12-04 LAB — CBC
HEMATOCRIT: 31.8 % — AB (ref 36.0–46.0)
Hemoglobin: 10.6 g/dL — ABNORMAL LOW (ref 12.0–15.0)
MCHC: 33.5 g/dL (ref 30.0–36.0)
MCV: 85.5 fl (ref 78.0–100.0)
Platelets: 253 10*3/uL (ref 150.0–400.0)
RBC: 3.72 Mil/uL — ABNORMAL LOW (ref 3.87–5.11)
RDW: 13.4 % (ref 11.5–15.5)
WBC: 3.5 10*3/uL — AB (ref 4.0–10.5)

## 2014-12-04 LAB — COMPREHENSIVE METABOLIC PANEL
ALT: 10 U/L (ref 0–35)
AST: 16 U/L (ref 0–37)
Albumin: 4 g/dL (ref 3.5–5.2)
Alkaline Phosphatase: 57 U/L (ref 39–117)
BILIRUBIN TOTAL: 0.3 mg/dL (ref 0.2–1.2)
BUN: 10 mg/dL (ref 6–23)
CO2: 30 mEq/L (ref 19–32)
CREATININE: 0.83 mg/dL (ref 0.40–1.20)
Calcium: 9.2 mg/dL (ref 8.4–10.5)
Chloride: 102 mEq/L (ref 96–112)
GFR: 94.73 mL/min (ref >60.00)
GLUCOSE: 79 mg/dL (ref 70–99)
Potassium: 3.4 mEq/L — ABNORMAL LOW (ref 3.5–5.1)
SODIUM: 137 meq/L (ref 135–145)
Total Protein: 7.9 g/dL (ref 6.0–8.3)

## 2014-12-04 LAB — LIPID PANEL
CHOLESTEROL: 173 mg/dL (ref 0–200)
HDL: 56.5 mg/dL (ref >39.00)
NonHDL: 116.01
Total CHOL/HDL Ratio: 3
Triglycerides: 231 mg/dL — ABNORMAL HIGH (ref 0.0–149.0)
VLDL: 46.2 mg/dL — AB (ref 0.0–40.0)

## 2014-12-04 LAB — LDL CHOLESTEROL, DIRECT: LDL DIRECT: 80 mg/dL

## 2014-12-04 NOTE — Progress Notes (Signed)
Pre visit review using our clinic review tool, if applicable. No additional management support is needed unless otherwise documented below in the visit note. 

## 2014-12-04 NOTE — Progress Notes (Addendum)
Subjective:    Patient ID: Cheryl Williams, female    DOB: 04-14-1966, 49 y.o.   MRN: [id number]  HPI  Pt presents to the clinic today for her annual exam.  Flu: 01/2014 Tetanus: < 10 years ago LMP: 11/21/2014 Pap Smear: 06/2013- normal Mammogram: 11/2014 Vision Screening: biannually 08/2014 Dentist: biannually  Diet: She eats fruits and veggies but not as often as she should. She likes all kinds of meat. She does consume some water and some tea. Exercise: She is not currently exercising.  Review of Systems  Past Medical History  Diagnosis Date  . Anemia   . Lupus   . GERD (gastroesophageal reflux disease)   . Chicken pox     Current Outpatient Prescriptions  Medication Sig Dispense Refill  . ALPRAZolam (XANAX) 0.5 MG tablet Take 0.5 mg by mouth daily as needed for anxiety. As needed for travel    . etonogestrel-ethinyl estradiol (NUVARING) 0.12-0.015 MG/24HR vaginal ring Place 1 each vaginally every 28 (twenty-eight) days. Insert vaginally and leave in place for 3 consecutive weeks, then remove for 1 week. 3 each 1  . hydroxychloroquine (PLAQUENIL) 200 MG tablet Take by mouth daily.    Marland Kitchen ibuprofen (ADVIL,MOTRIN) 600 MG tablet TAKE 1 TABLET BY MOUTH 3 TIMES DAILY FOR 5 DAYS, THEN AS NEEDED 60 tablet 5  . propranolol (INDERAL) 10 MG tablet Take 10 mg by mouth daily as needed.    . Suvorexant (BELSOMRA) 10 MG TABS Take 0.5 tablets by mouth at bedtime as needed. 30 tablet 0  . valACYclovir (VALTREX) 1000 MG tablet Take 0.5 tablets (500 mg total) by mouth 2 (two) times daily. Times 3 days for recurrent outbreaks and 1 tab daily for suppressive therapy. 30 tablet 0   No current facility-administered medications for this visit.    Allergies  Allergen Reactions  . Penicillins      Rash and hives at age 68  . Imuran [Azathioprine] Other (See Comments)    Mental hallucinations    Family History  Problem Relation Age of Onset  . Hyperlipidemia Mother   . Hypertension  Mother   . Deafness Mother   . Alcohol abuse Father   . Deafness Father   . Drug abuse Sister   . Hypertension Sister   . Drug abuse Brother   . Rheum arthritis Maternal Aunt   . Lupus Maternal Aunt   . Rheum arthritis Maternal Grandmother   . Hypertension Sister     Social History   Social History  . Marital Status: Divorced    Spouse Name: N/A  . Number of Children: N/A  . Years of Education: N/A   Occupational History  . Not on file.   Social History Main Topics  . Smoking status: Never Smoker   . Smokeless tobacco: Never Used  . Alcohol Use: 0.0 oz/week    0 Standard drinks or equivalent per week     Comment: occasional--moderate  . Drug Use: Not on file  . Sexual Activity: Yes   Other Topics Concern  . Not on file   Social History Narrative     Constitutional: Denies fever, malaise, fatigue, headache or abrupt weight changes.  HEENT: Denies eye pain, eye redness, ear pain, ringing in the ears, wax buildup, runny nose, nasal congestion, bloody nose, or sore throat. Respiratory: Denies difficulty breathing, shortness of breath, cough or sputum production.   Cardiovascular: Denies chest pain, chest tightness, palpitations or swelling in the hands or feet.  Gastrointestinal: Denies abdominal pain,  bloating, constipation, diarrhea or blood in the stool.  GU: Denies urgency, frequency, pain with urination, burning sensation, blood in urine, odor or discharge. Musculoskeletal: Denies decrease in range of motion, difficulty with gait, muscle pain or joint pain and swelling.  Skin: Denies redness, rashes, lesions or ulcercations.  Neurological: Denies dizziness, difficulty with memory, difficulty with speech or problems with balance and coordination.  Psych: Denies anxiety, depression, SI/HI.  No other specific complaints in a complete review of systems (except as listed in HPI above).     Objective:   Physical Exam  BP 104/72 mmHg  Pulse 109  Temp(Src) 98.4 F  (36.9 C) (Oral)  Ht 5\' 7"  (1.702 m)  Wt 151 lb (68.493 kg)  BMI 23.64 kg/m2  SpO2 98%  LMP 11/21/2014 Wt Readings from Last 3 Encounters:  12/04/14 151 lb (68.493 kg)  10/03/14 149 lb (67.586 kg)  09/17/12 145 lb (65.772 kg)    General: Appears her stated age, well developed, well nourished in NAD. Skin: Warm, dry and intact. No rashes, lesions or ulcerations noted. HEENT: Head: normal shape and size; Eyes: sclera white, no icterus, conjunctiva pink, PERRLA and EOMs intact; Ears: Tm's gray and intact, normal light reflex; Throat/Mouth: Teeth present, mucosa pink and moist, no exudate, lesions or ulcerations noted.  Neck: Neck supple, trachea midline. No masses, lumps or thyromegaly present.  Cardiovascular: Normal rate and rhythm. S1,S2 noted.  No murmur, rubs or gallops noted. No JVD or BLE edema. No carotid bruits noted. Pulmonary/Chest: Normal effort and positive vesicular breath sounds. No respiratory distress. No wheezes, rales or ronchi noted.  Abdomen: Soft and nontender. Normal bowel sounds, no bruits noted. No distention or masses noted. Liver, spleen and kidneys non palpable. Musculoskeletal: Normal range of motion. Strength 5/5 BUE/BLE. No difficulty with gait.  Neurological: Alert and oriented. Cranial nerves II-XII grossly intact. Coordination normal.  Psychiatric: Mood and affect normal. Behavior is normal. Judgment and thought content normal.         Assessment & Plan:   Preventative Health Maintenance:  Encouraged her to consume a balanced diet, with more fruits and veggies All HM UTD Will check CBC, CMET, Lipid an Vit D today  RTC in 1 year or sooner if needed

## 2014-12-04 NOTE — Patient Instructions (Signed)

## 2014-12-05 ENCOUNTER — Other Ambulatory Visit: Payer: Self-pay | Admitting: Internal Medicine

## 2014-12-05 DIAGNOSIS — R928 Other abnormal and inconclusive findings on diagnostic imaging of breast: Secondary | ICD-10-CM

## 2014-12-05 LAB — VITAMIN D 25 HYDROXY (VIT D DEFICIENCY, FRACTURES): VITD: 19.35 ng/mL — AB (ref 30.00–100.00)

## 2014-12-05 NOTE — Addendum Note (Signed)
Addended by: Lorre Munroe on: 12/05/2014 04:51 PM   Modules accepted: Kipp Brood

## 2014-12-08 ENCOUNTER — Encounter: Payer: Self-pay | Admitting: Internal Medicine

## 2014-12-09 MED ORDER — VITAMIN D (ERGOCALCIFEROL) 1.25 MG (50000 UNIT) PO CAPS: 50000.0000 [IU] | ORAL_CAPSULE | ORAL | Status: DC

## 2014-12-10 ENCOUNTER — Other Ambulatory Visit: Payer: 59

## 2014-12-11 ENCOUNTER — Ambulatory Visit
Admission: RE | Admit: 2014-12-11 | Discharge: 2014-12-11 | Disposition: A | Discharge status: Home / Self Care | Payer: 59 | Source: Ambulatory Visit | Attending: Internal Medicine | Admitting: Internal Medicine

## 2014-12-11 DIAGNOSIS — R928 Other abnormal and inconclusive findings on diagnostic imaging of breast: Secondary | ICD-10-CM

## 2014-12-31 ENCOUNTER — Ambulatory Visit (INDEPENDENT_AMBULATORY_CARE_PROVIDER_SITE_OTHER): Payer: 59 | Admitting: Internal Medicine

## 2014-12-31 ENCOUNTER — Encounter: Payer: Self-pay | Admitting: Internal Medicine

## 2014-12-31 VITALS — BP 116/80 | HR 107 | Temp 98.2°F | Wt 150.5 lb

## 2014-12-31 DIAGNOSIS — R300 Dysuria: Secondary | ICD-10-CM | POA: Diagnosis not present

## 2014-12-31 DIAGNOSIS — N390 Urinary tract infection, site not specified: Secondary | ICD-10-CM

## 2014-12-31 LAB — POCT URINALYSIS DIPSTICK
Bilirubin, UA: NEGATIVE
Glucose, UA: NEGATIVE
Ketones, UA: NEGATIVE
Nitrite, UA: NEGATIVE
PH UA: 6
Spec Grav, UA: 1.02
UROBILINOGEN UA: NEGATIVE

## 2014-12-31 MED ORDER — NITROFURANTOIN MONOHYD MACRO 100 MG PO CAPS: 100.0000 mg | ORAL_CAPSULE | Freq: Two times a day (BID) | ORAL | Status: DC

## 2014-12-31 NOTE — Progress Notes (Signed)
HPI  Pt presents to the clinic today with c/o urinary frequency and dysuria. This started yesterday. She denies fever, chills, nausea or low back pain. She has had UTI's in the past year and reports this feels the same. She denies vaginal discharge or abnormal bleeding. She has not taking anything OTC.   Review of Systems  Past Medical History  Diagnosis Date  . Anemia   . Lupus   . GERD (gastroesophageal reflux disease)   . Chicken pox     Family History  Problem Relation Age of Onset  . Hyperlipidemia Mother   . Hypertension Mother   . Deafness Mother   . Alcohol abuse Father   . Deafness Father   . Drug abuse Sister   . Hypertension Sister   . Drug abuse Brother   . Rheum arthritis Maternal Aunt   . Lupus Maternal Aunt   . Rheum arthritis Maternal Grandmother   . Hypertension Sister     Social History   Social History  . Marital Status: Divorced    Spouse Name: N/A  . Number of Children: N/A  . Years of Education: N/A   Occupational History  . Not on file.   Social History Main Topics  . Smoking status: Never Smoker   . Smokeless tobacco: Never Used  . Alcohol Use: 0.0 oz/week    0 Standard drinks or equivalent per week     Comment: occasional--moderate  . Drug Use: Not on file  . Sexual Activity: Yes   Other Topics Concern  . Not on file   Social History Narrative    Allergies  Allergen Reactions  . Penicillins      Rash and hives at age 89  . Imuran [Azathioprine] Other (See Comments)    Mental hallucinations    Constitutional: Denies fever, malaise, fatigue, headache or abrupt weight changes.   GU: Pt reports frequency and pain with urination. Denies burning sensation, blood in urine, odor or discharge. Skin: Denies redness, rashes, lesions or ulcercations.   No other specific complaints in a complete review of systems (except as listed in HPI above).    Objective:   Physical Exam  BP 116/80 mmHg  Pulse 107  Temp(Src) 98.2 F (36.8 C)  (Oral)  Wt 150 lb 8 oz (68.266 kg)  SpO2 98%  LMP 11/21/2014 Wt Readings from Last 3 Encounters:  12/31/14 150 lb 8 oz (68.266 kg)  12/04/14 151 lb (68.493 kg)  10/03/14 149 lb (67.586 kg)    General: Appears her stated age, well developed, well nourished in NAD. Cardiovascular: Normal rate and rhythm. S1,S2 noted.   Pulmonary/Chest: Normal effort and positive vesicular breath sounds. No respiratory distress. No wheezes, rales or ronchi noted.  Abdomen: Soft. Normal bowel sounds, no bruits noted. No distention or masses noted.No CVA tenderness.      Assessment & Plan:  Frequency, Dysuria secondary to UTI:  Urinalysis: 3+ leuks, 3+ blood Will send urine culture eRx sent if for Macrobid 100 mg BID x 5 days OK to take AZO OTC Drink plenty of fluids  RTC as needed or if symptoms persist.

## 2014-12-31 NOTE — Progress Notes (Signed)
Pre visit review using our clinic review tool, if applicable. No additional management support is needed unless otherwise documented below in the visit note. 

## 2014-12-31 NOTE — Addendum Note (Signed)
Addended by: Roena Malady on: 12/31/2014 03:29 PM   Modules accepted: Orders

## 2014-12-31 NOTE — Patient Instructions (Signed)

## 2015-01-03 LAB — URINE CULTURE

## 2015-02-16 ENCOUNTER — Encounter: Payer: Self-pay | Admitting: Internal Medicine

## 2015-02-16 ENCOUNTER — Telehealth: Payer: 59 | Admitting: Family

## 2015-02-16 DIAGNOSIS — N3000 Acute cystitis without hematuria: Secondary | ICD-10-CM

## 2015-02-16 MED ORDER — SULFAMETHOXAZOLE-TRIMETHOPRIM 800-160 MG PO TABS: 1.0000 | ORAL_TABLET | Freq: Two times a day (BID) | ORAL | Status: DC

## 2015-02-16 NOTE — Progress Notes (Signed)

## 2015-03-04 ENCOUNTER — Other Ambulatory Visit: Payer: Self-pay

## 2015-03-04 MED ORDER — PROPRANOLOL HCL 10 MG PO TABS: 10.0000 mg | ORAL_TABLET | Freq: Every day | ORAL | Status: DC | PRN

## 2015-03-04 NOTE — Telephone Encounter (Signed)
i do not see where you have filled this medication--please advise if okay to fill 

## 2015-03-04 NOTE — Telephone Encounter (Signed)
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 Will refill, sent electronically.

## 2015-04-22 MED FILL — IBUPROFEN 600 MG TABLET: 600 | 20 days supply | Qty: 60 | Fill #2

## 2015-05-07 ENCOUNTER — Other Ambulatory Visit: Payer: Self-pay

## 2015-05-07 MED ORDER — SUVOREXANT 10 MG PO TABS: 0.5000 | ORAL_TABLET | Freq: Every evening | ORAL | Status: DC | PRN

## 2015-05-07 MED ORDER — ETONOGESTREL-ETHINYL ESTRADIOL 0.12-0.015 MG/24HR VA RING: 1.0000 | VAGINAL_RING | VAGINAL | Status: DC

## 2015-05-07 MED FILL — BELSOMRA 10 MG TABLET: 10 | 60 days supply | Qty: 30 | Fill #0

## 2015-05-07 NOTE — Telephone Encounter (Signed)
Can Belsomra be phoned in. Nuva Ring sent electronically.

## 2015-05-07 NOTE — Telephone Encounter (Signed)
Rx called in to pharmacy. 

## 2015-05-07 NOTE — Telephone Encounter (Signed)
Pt left v/m; pt lost her nuva ring and request refill (last refilled 3 each x 1 on 11/28/14) and Belsomra (last printed # 30 on 10/03/14).[facility] outpt pharmacy. Last annual 12/04/14.

## 2015-05-12 ENCOUNTER — Other Ambulatory Visit: Payer: Self-pay | Admitting: Internal Medicine

## 2015-05-12 DIAGNOSIS — N6310 Unspecified lump in the right breast, unspecified quadrant: Secondary | ICD-10-CM

## 2015-05-19 MED FILL — NUVARING VAGINAL RING: 0.12-0.015 | 84 days supply | Qty: 3 | Fill #1

## 2015-05-21 DIAGNOSIS — L281 Prurigo nodularis: Secondary | ICD-10-CM | POA: Insufficient documentation

## 2015-06-12 ENCOUNTER — Ambulatory Visit
Admission: RE | Admit: 2015-06-12 | Discharge: 2015-06-12 | Disposition: A | Discharge status: Home / Self Care | Payer: 59 | Source: Ambulatory Visit | Attending: Internal Medicine | Admitting: Internal Medicine

## 2015-06-12 DIAGNOSIS — N6310 Unspecified lump in the right breast, unspecified quadrant: Secondary | ICD-10-CM

## 2015-06-12 DIAGNOSIS — N63 Unspecified lump in breast: Secondary | ICD-10-CM | POA: Diagnosis not present

## 2015-07-14 MED FILL — HYDROXYCHLOROQUINE 200 MG T: 200 | 90 days supply | Qty: 90 | Fill #1

## 2015-07-14 MED FILL — IBUPROFEN 600 MG TABLET: 600 | 20 days supply | Qty: 60 | Fill #3

## 2015-07-23 DIAGNOSIS — L439 Lichen planus, unspecified: Secondary | ICD-10-CM | POA: Insufficient documentation

## 2015-07-23 DIAGNOSIS — L281 Prurigo nodularis: Secondary | ICD-10-CM | POA: Diagnosis not present

## 2015-07-25 DIAGNOSIS — L281 Prurigo nodularis: Secondary | ICD-10-CM | POA: Diagnosis not present

## 2015-07-28 ENCOUNTER — Ambulatory Visit (INDEPENDENT_AMBULATORY_CARE_PROVIDER_SITE_OTHER): Payer: 59 | Admitting: Family Medicine

## 2015-07-28 DIAGNOSIS — L239 Allergic contact dermatitis, unspecified cause: Secondary | ICD-10-CM

## 2015-07-28 DIAGNOSIS — L200 Besnier's prurigo: Secondary | ICD-10-CM

## 2015-07-28 MED ORDER — BETAMETHASONE VALERATE 0.1 % EX CREA: TOPICAL_CREAM | Freq: Two times a day (BID) | CUTANEOUS | Status: DC

## 2015-07-28 MED ORDER — PREDNISONE 20 MG PO TABS: 20.0000 mg | ORAL_TABLET | Freq: Every day | ORAL | Status: AC

## 2015-07-28 MED FILL — BETAMETHASONE VA 0.1% CREAM: 0.1 | 15 days supply | Qty: 45 | Fill #0

## 2015-07-28 MED FILL — predniSONE 20 MG TABS: 20 | 5 days supply | Qty: 5 | Fill #0

## 2015-07-28 NOTE — Patient Instructions (Signed)
Likely dermatitis. Instruction given on use of topical and oral steroid. F/U soon if no improvement.

## 2015-07-28 NOTE — Progress Notes (Signed)
Patient ID: Cheryl Williams, female   DOB: 10-16-1965, 50 y.o.   MRN: [id number] Patient seen briefly for skin rash on her arms, hand and chest which started about 2-3 days ago associated with itching and redness. She is uncertain if this is due to seasonal change. Denies any other concern. No change in meds.  Exam: Gen: Not in distress. Skin: Scattered, wide spread, erythematous maculopapular rash on her forearms, arm and the dorsum of her hands B/L. Very mild excoriation marks. Similar rash on her chest and lower limbs. None on her back.  Diagnosis: Likely seasonal eczema or dermatitis. Contact dermatitis unlikely.                    I will prescribe topical steroid and oral steroid given the extent of spread.                   F/U as needed.

## 2015-07-29 ENCOUNTER — Ambulatory Visit: Payer: 59 | Admitting: Primary Care

## 2015-08-07 DIAGNOSIS — M329 Systemic lupus erythematosus, unspecified: Secondary | ICD-10-CM | POA: Diagnosis not present

## 2015-08-07 DIAGNOSIS — Z79899 Other long term (current) drug therapy: Secondary | ICD-10-CM | POA: Diagnosis not present

## 2015-08-07 DIAGNOSIS — G4700 Insomnia, unspecified: Secondary | ICD-10-CM | POA: Diagnosis not present

## 2015-08-07 DIAGNOSIS — R5383 Other fatigue: Secondary | ICD-10-CM | POA: Diagnosis not present

## 2015-09-02 ENCOUNTER — Other Ambulatory Visit: Payer: Self-pay | Admitting: Internal Medicine

## 2015-09-02 MED FILL — predniSONE 10 MG TABS: 10 | 6 days supply | Qty: 21 | Fill #0

## 2015-09-02 MED FILL — NUVARING VAGINAL RING: 0.12-0.015 | 84 days supply | Qty: 3 | Fill #2

## 2015-09-03 NOTE — Telephone Encounter (Signed)
Ok to phone in Belsomra 

## 2015-09-03 NOTE — Telephone Encounter (Signed)
Last filled 05/08/15--please advise 

## 2015-09-04 DIAGNOSIS — M3210 Systemic lupus erythematosus, organ or system involvement unspecified: Secondary | ICD-10-CM | POA: Diagnosis not present

## 2015-09-04 MED FILL — BELSOMRA 10 MG TABLET: 10 | 30 days supply | Qty: 30 | Fill #0

## 2015-09-04 NOTE — Telephone Encounter (Signed)
Rx called in to pharmacy. 

## 2015-09-28 ENCOUNTER — Encounter: Payer: Self-pay | Admitting: Internal Medicine

## 2015-09-28 ENCOUNTER — Ambulatory Visit (INDEPENDENT_AMBULATORY_CARE_PROVIDER_SITE_OTHER): Payer: 59 | Admitting: Internal Medicine

## 2015-09-28 VITALS — BP 106/68 | HR 89 | Temp 98.2°F | Wt 145.0 lb

## 2015-09-28 DIAGNOSIS — I878 Other specified disorders of veins: Secondary | ICD-10-CM

## 2015-09-28 NOTE — Patient Instructions (Signed)
Varicose Veins Varicose veins are veins that have become enlarged and twisted. They are usually seen in the legs but can occur in other parts of the body as well. CAUSES This condition is the result of valves in the veins not working properly. Valves in the veins help to return blood from the leg to the heart. If these valves are damaged, blood flows backward and backs up into the veins in the leg near the skin. This causes the veins to become larger. RISK FACTORS People who are on their feet a lot, who are pregnant, or who are overweight are more likely to develop varicose veins. SIGNS AND SYMPTOMS  Bulging, twisted-appearing, bluish veins, most commonly found on the legs.  Leg pain or a feeling of heaviness. These symptoms may be worse at the end of the day.  Leg swelling.  Changes in skin color. DIAGNOSIS A health care provider can usually diagnose varicose veins by examining your legs. Your health care provider may also recommend an ultrasound of your leg veins. TREATMENT Most varicose veins can be treated at home.However, other treatments are available for people who have persistent symptoms or want to improve the cosmetic appearance of the varicose veins. These treatment options include:  Sclerotherapy. A solution is injected into the vein to close it off.  Laser treatment. A laser is used to heat the vein to close it off.  Radiofrequency vein ablation. An electrical current produced by radio waves is used to close off the vein.  Phlebectomy. The vein is surgically removed through small incisions made over the varicose vein.  Vein ligation and stripping. The vein is surgically removed through incisions made over the varicose vein after the vein has been tied (ligated). HOME CARE INSTRUCTIONS  Do not stand or sit in one position for long periods of time. Do not sit with your legs crossed. Rest with your legs raised during the day.  Wear compression stockings as directed by your  health care provider. These stockings help to prevent blood clots and reduce swelling in your legs.  Do not wear other tight, encircling garments around your legs, pelvis, or waist.  Walk as much as possible to increase blood flow.  Raise the foot of your bed at night with 2-inch blocks.  If you get a cut in the skin over the vein and the vein bleeds, lie down with your leg raised and press on it with a clean cloth until the bleeding stops. Then place a bandage (dressing) on the cut. See your health care provider if it continues to bleed. SEEK MEDICAL CARE IF:  The skin around your ankle starts to break down.  You have pain, redness, tenderness, or hard swelling in your leg over a vein.  You are uncomfortable because of leg pain.   This information is not intended to replace advice given to you by your health care provider. Make sure you discuss any questions you have with your health care provider.   Document Released: 01/12/2005 Document Revised: 04/25/2014 Document Reviewed: 08/20/2013 Elsevier Interactive Patient Education 2016 [employer]  

## 2015-09-28 NOTE — Progress Notes (Signed)
Subjective:    Patient ID: Cheryl Williams, female    DOB: 1965/09/20, 50 y.o.   MRN: [id number]  HPI  Pt presents to the clinic today with c/o a prominent vein under her left armpit. She noticed this a few weeks ago. The vein is enlarged but not warm, red or tender. She has had a similar issue with a vein in her right arm in 2012. She had a ultrasound which did not show any evidence of DVT. She thinks this may be a flare of her lupus but just wanted to make sure. She has not tried anything OTC for this.  Review of Systems      Past Medical History  Diagnosis Date  . Anemia   . Lupus (HCC)   . GERD (gastroesophageal reflux disease)   . Chicken pox     Current Outpatient Prescriptions  Medication Sig Dispense Refill  . ALPRAZolam (XANAX) 0.5 MG tablet Take 0.5 mg by mouth daily as needed for anxiety. As needed for travel    . BELSOMRA 10 MG TABS TAKE 1/2 TABLET BY MOUTH AT BEDTIME AS NEEDED 30 tablet 0  . betamethasone valerate (VALISONE) 0.1 % cream Apply topically 2 (two) times daily. 30 g 0  . etonogestrel-ethinyl estradiol (NUVARING) 0.12-0.015 MG/24HR vaginal ring Place vaginally.    . hydroxychloroquine (PLAQUENIL) 200 MG tablet Take by mouth daily.    Marland Kitchen. ibuprofen (ADVIL,MOTRIN) 600 MG tablet TAKE 1 TABLET BY MOUTH 3 TIMES DAILY FOR 5 DAYS, THEN AS NEEDED 60 tablet 5  . propranolol (INDERAL) 10 MG tablet Take 1 tablet (10 mg total) by mouth daily as needed. 30 tablet 1  . valACYclovir (VALTREX) 1000 MG tablet Take 0.5 tablets (500 mg total) by mouth 2 (two) times daily. Times 3 days for recurrent outbreaks and 1 tab daily for suppressive therapy. 30 tablet 0   No current facility-administered medications for this visit.    Allergies  Allergen Reactions  . Penicillins      Rash and hives at age 89  . Imuran [Azathioprine] Other (See Comments)    Mental hallucinations    Family History  Problem Relation Age of Onset  . Hyperlipidemia Mother   . Hypertension  Mother   . Deafness Mother   . Alcohol abuse Father   . Deafness Father   . Drug abuse Sister   . Hypertension Sister   . Drug abuse Brother   . Rheum arthritis Maternal Aunt   . Lupus Maternal Aunt   . Rheum arthritis Maternal Grandmother   . Hypertension Sister     Social History   Social History  . Marital Status: Divorced    Spouse Name: N/A  . Number of Children: N/A  . Years of Education: N/A   Occupational History  . Not on file.   Social History Main Topics  . Smoking status: Never Smoker   . Smokeless tobacco: Never Used  . Alcohol Use: 0.0 oz/week    0 Standard drinks or equivalent per week     Comment: occasional--moderate  . Drug Use: Not on file  . Sexual Activity: Yes   Other Topics Concern  . Not on file   Social History Narrative     Constitutional: Denies fever, malaise, fatigue, headache or abrupt weight changes.  Respiratory: Denies difficulty breathing, shortness of breath, cough or sputum production.   Cardiovascular: Denies chest pain, chest tightness, palpitations or swelling in the hands or feet.  Skin: Pt reports a prominent vein of  her LUE. Denies redness, rashes, lesions or ulcercations.    No other specific complaints in a complete review of systems (except as listed in HPI above).  Objective:   Physical Exam  BP 106/68 mmHg  Pulse 89  Temp(Src) 98.2 F (36.8 C) (Oral)  Wt 145 lb (65.772 kg)  SpO2 99% Wt Readings from Last 3 Encounters:  09/28/15 145 lb (65.772 kg)  12/31/14 150 lb 8 oz (68.266 kg)  12/04/14 151 lb (68.493 kg)    General: Appears her stated age, well developed, well nourished in NAD. Skin: Warm, dry and intact. Prominent axillary vein, left side. No redness, pain warmth noted. Cardiovascular: Normal rate and rhythm. S1,S2 noted.  No murmur, rubs or gallops noted.  Pulmonary/Chest: Normal effort and positive vesicular breath sounds. No respiratory distress. No wheezes, rales or ronchi noted.    BMET      Component Value Date/Time   NA 137 12/04/2014 1450   K 3.4* 12/04/2014 1450   CL 102 12/04/2014 1450   CO2 30 12/04/2014 1450   GLUCOSE 79 12/04/2014 1450   BUN 10 12/04/2014 1450   CREATININE 0.83 12/04/2014 1450   CALCIUM 9.2 12/04/2014 1450    Lipid Panel     Component Value Date/Time   CHOL 173 12/04/2014 1450   TRIG 231.0* 12/04/2014 1450   HDL 56.50 12/04/2014 1450   CHOLHDL 3 12/04/2014 1450   VLDL 46.2* 12/04/2014 1450    CBC    Component Value Date/Time   WBC 3.5* 12/04/2014 1450   RBC 3.72* 12/04/2014 1450   HGB 10.6* 12/04/2014 1450   HCT 31.8* 12/04/2014 1450   PLT 253.0 12/04/2014 1450   MCV 85.5 12/04/2014 1450   MCHC 33.5 12/04/2014 1450   RDW 13.4 12/04/2014 1450    Hgb A1C No results found for: HGBA1C       Assessment & Plan:   Prominent Axillary Vein:  Benign No intervention needed, will monitor  RTC in 2 months for your annual exam

## 2015-09-28 NOTE — Progress Notes (Deleted)
Subjective:     Patient ID: Cheryl Williams, female   DOB: [birth year], 50 y.o.   MRN: [id number]  HPI   Review of Systems     Objective:   Physical Exam     Assessment:     ***    Plan:     ***

## 2015-09-28 NOTE — Progress Notes (Signed)
Pre visit review using our clinic review tool, if applicable. No additional management support is needed unless otherwise documented below in the visit note. 

## 2015-11-02 ENCOUNTER — Other Ambulatory Visit: Payer: Self-pay | Admitting: Internal Medicine

## 2015-11-02 DIAGNOSIS — Z1231 Encounter for screening mammogram for malignant neoplasm of breast: Secondary | ICD-10-CM

## 2015-11-20 MED FILL — NUVARING VAGINAL RING: 0.12-0.015 | 84 days supply | Qty: 3 | Fill #3

## 2015-12-04 ENCOUNTER — Ambulatory Visit
Admission: RE | Admit: 2015-12-04 | Discharge: 2015-12-04 | Disposition: A | Discharge status: Home / Self Care | Payer: 59 | Source: Ambulatory Visit | Attending: Internal Medicine | Admitting: Internal Medicine

## 2015-12-04 DIAGNOSIS — Z1231 Encounter for screening mammogram for malignant neoplasm of breast: Secondary | ICD-10-CM | POA: Diagnosis not present

## 2015-12-08 ENCOUNTER — Other Ambulatory Visit: Payer: Self-pay | Admitting: Internal Medicine

## 2015-12-08 DIAGNOSIS — R928 Other abnormal and inconclusive findings on diagnostic imaging of breast: Secondary | ICD-10-CM

## 2015-12-11 ENCOUNTER — Ambulatory Visit (INDEPENDENT_AMBULATORY_CARE_PROVIDER_SITE_OTHER): Payer: 59 | Admitting: Internal Medicine

## 2015-12-11 ENCOUNTER — Encounter: Payer: Self-pay | Admitting: Internal Medicine

## 2015-12-11 VITALS — BP 110/70 | HR 102 | Temp 98.6°F | Ht 67.0 in | Wt 145.5 lb

## 2015-12-11 DIAGNOSIS — M329 Systemic lupus erythematosus, unspecified: Secondary | ICD-10-CM

## 2015-12-11 DIAGNOSIS — B009 Herpesviral infection, unspecified: Secondary | ICD-10-CM

## 2015-12-11 DIAGNOSIS — K219 Gastro-esophageal reflux disease without esophagitis: Secondary | ICD-10-CM | POA: Diagnosis not present

## 2015-12-11 DIAGNOSIS — Z0000 Encounter for general adult medical examination without abnormal findings: Secondary | ICD-10-CM | POA: Diagnosis not present

## 2015-12-11 DIAGNOSIS — I7300 Raynaud's syndrome without gangrene: Secondary | ICD-10-CM | POA: Diagnosis not present

## 2015-12-11 DIAGNOSIS — M3500 Sicca syndrome, unspecified: Secondary | ICD-10-CM

## 2015-12-11 DIAGNOSIS — G4700 Insomnia, unspecified: Secondary | ICD-10-CM

## 2015-12-11 DIAGNOSIS — F411 Generalized anxiety disorder: Secondary | ICD-10-CM

## 2015-12-11 LAB — COMPREHENSIVE METABOLIC PANEL
ALK PHOS: 58 U/L (ref 33–115)
ALT: 9 U/L (ref 6–29)
AST: 16 U/L (ref 10–35)
Albumin: 3.8 g/dL (ref 3.6–5.1)
BILIRUBIN TOTAL: 0.3 mg/dL (ref 0.2–1.2)
BUN: 10 mg/dL (ref 7–25)
CO2: 26 mmol/L (ref 20–31)
CREATININE: 0.86 mg/dL (ref 0.50–1.10)
Calcium: 9.1 mg/dL (ref 8.6–10.2)
Chloride: 103 mmol/L (ref 98–110)
Glucose, Bld: 84 mg/dL (ref 65–99)
Potassium: 3.6 mmol/L (ref 3.5–5.3)
SODIUM: 140 mmol/L (ref 135–146)
TOTAL PROTEIN: 6.9 g/dL (ref 6.1–8.1)

## 2015-12-11 LAB — CBC
HEMATOCRIT: 30.9 % — AB (ref 35.0–45.0)
Hemoglobin: 10.2 g/dL — ABNORMAL LOW (ref 11.7–15.5)
MCH: 28.4 pg (ref 27.0–33.0)
MCHC: 33 g/dL (ref 32.0–36.0)
MCV: 86.1 fL (ref 80.0–100.0)
MPV: 10.9 fL (ref 7.5–12.5)
Platelets: 258 10*3/uL (ref 140–400)
RBC: 3.59 MIL/uL — ABNORMAL LOW (ref 3.80–5.10)
RDW: 13.9 % (ref 11.0–15.0)
WBC: 3.5 10*3/uL — AB (ref 3.8–10.8)

## 2015-12-11 LAB — LIPID PANEL
Cholesterol: 175 mg/dL (ref 125–200)
HDL: 73 mg/dL (ref >=46)
LDL CALC: 69 mg/dL (ref <130)
TRIGLYCERIDES: 167 mg/dL — AB (ref <150)
Total CHOL/HDL Ratio: 2.4 Ratio (ref <=5.0)
VLDL: 33 mg/dL — ABNORMAL HIGH (ref <30)

## 2015-12-11 MED ORDER — ALPRAZOLAM 0.5 MG PO TABS
0.5000 mg | ORAL_TABLET | Freq: Every day | ORAL | 0 refills | Status: DC | PRN
Start: 2015-12-11 — End: 2016-10-14

## 2015-12-11 MED ORDER — SUVOREXANT 10 MG PO TABS: 0.5000 | ORAL_TABLET | Freq: Every evening | ORAL | 0 refills | Status: DC | PRN

## 2015-12-11 MED ORDER — ETONOGESTREL-ETHINYL ESTRADIOL 0.12-0.015 MG/24HR VA RING: 1.0000 | VAGINAL_RING | VAGINAL | 3 refills | Status: DC

## 2015-12-11 NOTE — Assessment & Plan Note (Signed)
-  Continue to follow with rheumatology

## 2015-12-11 NOTE — Assessment & Plan Note (Signed)
No recent flares on Plaquenil She will continue to follow with rheumatology

## 2015-12-11 NOTE — Assessment & Plan Note (Signed)
Controlled on Protonix prn Encouraged her to avoid triggers

## 2015-12-11 NOTE — Assessment & Plan Note (Signed)
Currently not an issue Will monitor 

## 2015-12-11 NOTE — Assessment & Plan Note (Signed)
Continue Belsomra Refilled today

## 2015-12-11 NOTE — Assessment & Plan Note (Signed)
Sporadic depending on the situation Xanax refilled today

## 2015-12-11 NOTE — Patient Instructions (Signed)
Health Maintenance, Female Adopting a healthy lifestyle and getting preventive care can go a long way to promote health and wellness. Talk with your health care provider about what schedule of regular examinations is right for you. This is a good chance for you to check in with your provider about disease prevention and staying healthy. In between checkups, there are plenty of things you can do on your own. Experts have done a lot of research about which lifestyle changes and preventive measures are most likely to keep you healthy. Ask your health care provider for more information. WEIGHT AND DIET  Eat a healthy diet  Be sure to include plenty of vegetables, fruits, low-fat dairy products, and lean protein.  Do not eat a lot of foods high in solid fats, added sugars, or salt.  Get regular exercise. This is one of the most important things you can do for your health.  Most adults should exercise for at least 150 minutes each week. The exercise should increase your heart rate and make you sweat (moderate-intensity exercise).  Most adults should also do strengthening exercises at least twice a week. This is in addition to the moderate-intensity exercise.  Maintain a healthy weight  Body mass index (BMI) is a measurement that can be used to identify possible weight problems. It estimates body fat based on height and weight. Your health care provider can help determine your BMI and help you achieve or maintain a healthy weight.  For females 20 years of age and older:   A BMI below 18.5 is considered underweight.  A BMI of 18.5 to 24.9 is normal.  A BMI of 25 to 29.9 is considered overweight.  A BMI of 30 and above is considered obese.  Watch levels of cholesterol and blood lipids  You should start having your blood tested for lipids and cholesterol at 48 years of age, then have this test every 5 years.  You may need to have your cholesterol levels checked more often if:  Your lipid  or cholesterol levels are high.  You are older than 48 years of age.  You are at high risk for heart disease.  CANCER SCREENING   Lung Cancer  Lung cancer screening is recommended for adults 55-80 years old who are at high risk for lung cancer because of a history of smoking.  A yearly low-dose CT scan of the lungs is recommended for people who:  Currently smoke.  Have quit within the past 15 years.  Have at least a 30-pack-year history of smoking. A pack year is smoking an average of one pack of cigarettes a day for 1 year.  Yearly screening should continue until it has been 15 years since you quit.  Yearly screening should stop if you develop a health problem that would prevent you from having lung cancer treatment.  Breast Cancer  Practice breast self-awareness. This means understanding how your breasts normally appear and feel.  It also means doing regular breast self-exams. Let your health care provider know about any changes, no matter how small.  If you are in your 20s or 30s, you should have a clinical breast exam (CBE) by a health care provider every 1-3 years as part of a regular health exam.  If you are 40 or older, have a CBE every year. Also consider having a breast X-ray (mammogram) every year.  If you have a family history of breast cancer, talk to your health care provider about genetic screening.  If you   are at high risk for breast cancer, talk to your health care provider about having an MRI and a mammogram every year.  Breast cancer gene (BRCA) assessment is recommended for women who have family members with BRCA-related cancers. BRCA-related cancers include:  Breast.  Ovarian.  Tubal.  Peritoneal cancers.  Results of the assessment will determine the need for genetic counseling and BRCA1 and BRCA2 testing. Cervical Cancer Your health care provider may recommend that you be screened regularly for cancer of the pelvic organs (ovaries, uterus, and  vagina). This screening involves a pelvic examination, including checking for microscopic changes to the surface of your cervix (Pap test). You may be encouraged to have this screening done every 3 years, beginning at age 21.  For women ages 30-65, health care providers may recommend pelvic exams and Pap testing every 3 years, or they may recommend the Pap and pelvic exam, combined with testing for human papilloma virus (HPV), every 5 years. Some types of HPV increase your risk of cervical cancer. Testing for HPV may also be done on women of any age with unclear Pap test results.  Other health care providers may not recommend any screening for nonpregnant women who are considered low risk for pelvic cancer and who do not have symptoms. Ask your health care provider if a screening pelvic exam is right for you.  If you have had past treatment for cervical cancer or a condition that could lead to cancer, you need Pap tests and screening for cancer for at least 20 years after your treatment. If Pap tests have been discontinued, your risk factors (such as having a new sexual partner) need to be reassessed to determine if screening should resume. Some women have medical problems that increase the chance of getting cervical cancer. In these cases, your health care provider may recommend more frequent screening and Pap tests. Colorectal Cancer  This type of cancer can be detected and often prevented.  Routine colorectal cancer screening usually begins at 48 years of age and continues through 48 years of age.  Your health care provider may recommend screening at an earlier age if you have risk factors for colon cancer.  Your health care provider may also recommend using home test kits to check for hidden blood in the stool.  A small camera at the end of a tube can be used to examine your colon directly (sigmoidoscopy or colonoscopy). This is done to check for the earliest forms of colorectal  cancer.  Routine screening usually begins at age 50.  Direct examination of the colon should be repeated every 5-10 years through 48 years of age. However, you may need to be screened more often if early forms of precancerous polyps or small growths are found. Skin Cancer  Check your skin from head to toe regularly.  Tell your health care provider about any new moles or changes in moles, especially if there is a change in a mole's shape or color.  Also tell your health care provider if you have a mole that is larger than the size of a pencil eraser.  Always use sunscreen. Apply sunscreen liberally and repeatedly throughout the day.  Protect yourself by wearing long sleeves, pants, a wide-brimmed hat, and sunglasses whenever you are outside. HEART DISEASE, DIABETES, AND HIGH BLOOD PRESSURE   High blood pressure causes heart disease and increases the risk of stroke. High blood pressure is more likely to develop in:  People who have blood pressure in the high end   of the normal range (130-139/85-89 mm Hg).  People who are overweight or obese.  People who are African American.  If you are 38-23 years of age, have your blood pressure checked every 3-5 years. If you are 61 years of age or older, have your blood pressure checked every year. You should have your blood pressure measured twice--once when you are at a hospital or clinic, and once when you are not at a hospital or clinic. Record the average of the two measurements. To check your blood pressure when you are not at a hospital or clinic, you can use:  An automated blood pressure machine at a pharmacy.  A home blood pressure monitor.  If you are between 45 years and 39 years old, ask your health care provider if you should take aspirin to prevent strokes.  Have regular diabetes screenings. This involves taking a blood sample to check your fasting blood sugar level.  If you are at a normal weight and have a low risk for diabetes,  have this test once every three years after 48 years of age.  If you are overweight and have a high risk for diabetes, consider being tested at a younger age or more often. PREVENTING INFECTION  Hepatitis B  If you have a higher risk for hepatitis B, you should be screened for this virus. You are considered at high risk for hepatitis B if:  You were born in a country where hepatitis B is common. Ask your health care provider which countries are considered high risk.  Your parents were born in a high-risk country, and you have not been immunized against hepatitis B (hepatitis B vaccine).  You have HIV or AIDS.  You use needles to inject street drugs.  You live with someone who has hepatitis B.  You have had sex with someone who has hepatitis B.  You get hemodialysis treatment.  You take certain medicines for conditions, including cancer, organ transplantation, and autoimmune conditions. Hepatitis C  Blood testing is recommended for:  Everyone born from [birth year] through 1963.  Anyone with known risk factors for hepatitis C. Sexually transmitted infections (STIs)  You should be screened for sexually transmitted infections (STIs) including gonorrhea and chlamydia if:  You are sexually active and are younger than 48 years of age.  You are older than 48 years of age and your health care provider tells you that you are at risk for this type of infection.  Your sexual activity has changed since you were last screened and you are at an increased risk for chlamydia or gonorrhea. Ask your health care provider if you are at risk.  If you do not have HIV, but are at risk, it may be recommended that you take a prescription medicine daily to prevent HIV infection. This is called pre-exposure prophylaxis (PrEP). You are considered at risk if:  You are sexually active and do not regularly use condoms or know the HIV status of your partner(s).  You take drugs by injection.  You are sexually  active with a partner who has HIV. Talk with your health care provider about whether you are at high risk of being infected with HIV. If you choose to begin PrEP, you should first be tested for HIV. You should then be tested every 3 months for as long as you are taking PrEP.  PREGNANCY   If you are premenopausal and you may become pregnant, ask your health care provider about preconception counseling.  If you may  become pregnant, take 400 to 800 micrograms (mcg) of folic acid every day.  If you want to prevent pregnancy, talk to your health care provider about birth control (contraception). OSTEOPOROSIS AND MENOPAUSE   Osteoporosis is a disease in which the bones lose minerals and strength with aging. This can result in serious bone fractures. Your risk for osteoporosis can be identified using a bone density scan.  If you are 61 years of age or older, or if you are at risk for osteoporosis and fractures, ask your health care provider if you should be screened.  Ask your health care provider whether you should take a calcium or vitamin D supplement to lower your risk for osteoporosis.  Menopause may have certain physical symptoms and risks.  Hormone replacement therapy may reduce some of these symptoms and risks. Talk to your health care provider about whether hormone replacement therapy is right for you.  HOME CARE INSTRUCTIONS   Schedule regular health, dental, and eye exams.  Stay current with your immunizations.   Do not use any tobacco products including cigarettes, chewing tobacco, or electronic cigarettes.  If you are pregnant, do not drink alcohol.  If you are breastfeeding, limit how much and how often you drink alcohol.  Limit alcohol intake to no more than 1 drink per day for nonpregnant women. One drink equals 12 ounces of beer, 5 ounces of wine, or 1 ounces of hard liquor.  Do not use street drugs.  Do not share needles.  Ask your health care provider for help if  you need support or information about quitting drugs.  Tell your health care provider if you often feel depressed.  Tell your health care provider if you have ever been abused or do not feel safe at home.   This information is not intended to replace advice given to you by your health care provider. Make sure you discuss any questions you have with your health care provider.   Document Released: 10/18/2010 Document Revised: 04/25/2014 Document Reviewed: 03/06/2013 Elsevier Interactive Patient Education [employer].

## 2015-12-11 NOTE — Progress Notes (Signed)
Subjective:    Patient ID: Cheryl Williams, female    DOB: 12-22-1965, 50 y.o.   MRN: [id number]  HPI  Pt presents to the clinic today for her annual exam. She is also due for follow up of chronic conditions.  Lupus/Sjpogrens: She follows with Dr. Dierdre Forth, rheumatology. She is on Plaquenil once daily. She was not able to take it twice daily because her white blood cell count got too low.  Anxiety: Worse when she travels. She does not like enclosed spaces. She has trouble flying. She only takes the Xanax as needed. She also takes Propanolol as needed prior to a public speech.  GERD: Triggered by acidic foods. She does have Protonix but rarely uses it. She will take Tums as needed for reflux.  Her reflux improved after she had her  gallbladder out.  Insomnia: She has trouble falling asleep. She is unable to turn her mind off. This is most likely related to anxiety and stress. She takes Belsomra only as needed with good relief..  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 She has Valtrex to use as needed.  Raynauds: Worse in summer, when the temperature is cold inside. She is not medicated for this.  Flu: 01/2015 Tetanus: < 10 years ago Pap Smear: 06/2013- normal Mammogram: 11/2015 Vision Screening: biannually Dentist: biannually  Diet: She eats fruits and veggies but not as often as she should. She likes all kinds of meat. She does consume some water and some tea. Exercise: She is not currently exercising.  Review of Systems  Past Medical History:  Diagnosis Date  . Anemia   . Chicken pox   . GERD (gastroesophageal reflux disease)   . Lupus [facility])     Current Outpatient Prescriptions  Medication Sig Dispense Refill  . ALPRAZolam (XANAX) 0.5 MG tablet Take 0.5 mg by mouth daily as needed for anxiety. As needed for travel    . BELSOMRA 10 MG TABS TAKE 1/2 TABLET BY MOUTH AT BEDTIME AS NEEDED 30 tablet 0  . betamethasone valerate (VALISONE) 0.1 % cream  Apply topically 2 (two) times daily. 30 g 0  . etonogestrel-ethinyl estradiol (NUVARING) 0.12-0.015 MG/24HR vaginal ring Place vaginally.    . hydroxychloroquine (PLAQUENIL) 200 MG tablet Take by mouth daily.    Marland Kitchen ibuprofen (ADVIL,MOTRIN) 600 MG tablet TAKE 1 TABLET BY MOUTH 3 TIMES DAILY FOR 5 DAYS, THEN AS NEEDED 60 tablet 5  . propranolol (INDERAL) 10 MG tablet Take 1 tablet (10 mg total) by mouth daily as needed. 30 tablet 1  . valACYclovir (VALTREX) 1000 MG tablet Take 0.5 tablets (500 mg total) by mouth 2 (two) times daily. Times 3 days for recurrent outbreaks and 1 tab daily for suppressive therapy. 30 tablet 0   No current facility-administered medications for this visit.     Allergies  Allergen Reactions  . Penicillins      Rash and hives at age 74  . Imuran [Azathioprine] Other (See Comments)    Mental hallucinations    Family History  Problem Relation Age of Onset  . Hyperlipidemia Mother   . Hypertension Mother   . Deafness Mother   . Alcohol abuse Father   . Deafness Father   . Drug abuse Sister   . Hypertension Sister   . Drug abuse Brother   . Rheum arthritis Maternal Aunt   . Lupus Maternal Aunt   . Rheum arthritis Maternal Grandmother   . Hypertension Sister  Social History   Social History  . Marital status: Divorced    Spouse name: N/A  . Number of children: N/A  . Years of education: N/A   Occupational History  . Not on file.   Social History Main Topics  . Smoking status: Never Smoker  . Smokeless tobacco: Never Used  . Alcohol use 0.0 oz/week     Comment: occasional--moderate  . Drug use: Unknown  . Sexual activity: Yes   Other Topics Concern  . Not on file   Social History Narrative  . No narrative on file     Constitutional: Denies fever, malaise, fatigue, headache or abrupt weight changes.  HEENT: Denies eye pain, eye redness, ear pain, ringing in the ears, wax buildup, runny nose, nasal congestion, bloody nose, or sore  throat. Respiratory: Denies difficulty breathing, shortness of breath, cough or sputum production.   Cardiovascular: Denies chest pain, chest tightness, palpitations or swelling in the hands or feet.  Gastrointestinal: Denies abdominal pain, bloating, constipation, diarrhea or blood in the stool.  GU: Denies urgency, frequency, pain with urination, burning sensation, blood in urine, odor or discharge. Musculoskeletal: Denies decrease in range of motion, difficulty with gait, muscle pain or joint pain and swelling.  Skin: Denies redness, rashes, lesions or ulcercations.  Neurological: Denies dizziness, difficulty with memory, difficulty with speech or problems with balance and coordination.  Psych: Denies anxiety, depression, SI/HI.  No other specific complaints in a complete review of systems (except as listed in HPI above).     Objective:   Physical Exam  BP 110/70   Pulse (!) 102   Temp 98.6 F (37 C) (Oral)   Ht 5\' 7"  (1.702 m)   Wt 145 lb 8 oz (66 kg)   LMP 11/25/2015   SpO2 97%   BMI 22.79 kg/m   Wt Readings from Last 3 Encounters:  09/28/15 145 lb (65.8 kg)  12/31/14 150 lb 8 oz (68.3 kg)  12/04/14 151 lb (68.5 kg)    General: Appears her stated age, well developed, well nourished in NAD. Skin: Warm, dry and intact. No rashes, lesions or ulcerations noted. HEENT: Head: normal shape and size; Eyes: sclera white, no icterus, conjunctiva pink, PERRLA and EOMs intact; Ears: Tm's gray and intact, normal light reflex; Throat/Mouth: Teeth present, mucosa pink and moist, no exudate, lesions or ulcerations noted.  Neck: Neck supple, trachea midline. No masses, lumps or thyromegaly present.  Cardiovascular: Normal rate and rhythm. S1,S2 noted.  No murmur, rubs or gallops noted. No JVD or BLE edema. No carotid bruits noted. Pulmonary/Chest: Normal effort and positive vesicular breath sounds. No respiratory distress. No wheezes, rales or ronchi noted.  Abdomen: Soft and nontender.  Normal bowel sounds. No distention or masses noted. Liver, spleen and kidneys non palpable. Musculoskeletal: Normal range of motion. Strength 5/5 BUE/BLE. No difficulty with gait.  Neurological: Alert and oriented. Cranial nerves II-XII grossly intact. Coordination normal.  Psychiatric: Mood and affect normal. Behavior is normal. Judgment and thought content normal.         Assessment & Plan:   Preventative Health Maintenance:  Advised her to get a flu shot in the fall Tetanus UTD Encouraged her to consume a balanced diet, with more fruits and veggies Pap smear and mammogram UTD Encouraged her to see and eye doctor and dentist annually Will check CBC, CMET, Lipid an Vit D today  RTC in 1 year or sooner if needed Nicki ReaperBAITY, REGINA, NP

## 2015-12-11 NOTE — Assessment & Plan Note (Signed)
Continue Valtrex prn for flares

## 2015-12-22 ENCOUNTER — Other Ambulatory Visit: Payer: Self-pay | Admitting: Internal Medicine

## 2015-12-22 ENCOUNTER — Ambulatory Visit
Admission: RE | Admit: 2015-12-22 | Discharge: 2015-12-22 | Disposition: A | Discharge status: Home / Self Care | Payer: 59 | Source: Ambulatory Visit | Attending: Internal Medicine | Admitting: Internal Medicine

## 2015-12-22 DIAGNOSIS — R928 Other abnormal and inconclusive findings on diagnostic imaging of breast: Secondary | ICD-10-CM

## 2015-12-22 DIAGNOSIS — R590 Localized enlarged lymph nodes: Secondary | ICD-10-CM | POA: Diagnosis not present

## 2016-01-08 ENCOUNTER — Other Ambulatory Visit: Payer: Self-pay | Admitting: *Deleted

## 2016-01-08 MED ORDER — IBUPROFEN 600 MG PO TABS: 600.0000 mg | ORAL_TABLET | Freq: Three times a day (TID) | ORAL | 5 refills | Status: DC

## 2016-01-08 MED FILL — HYDROXYCHLOROQUINE 200 MG T: 200 | 90 days supply | Qty: 90 | Fill #2

## 2016-01-08 MED FILL — IBUPROFEN 600 MG TABLET: 600 | 20 days supply | Qty: 60 | Fill #0

## 2016-01-08 MED FILL — BELSOMRA 10 MG TABLET: 10 | 60 days supply | Qty: 30 | Fill #0

## 2016-01-08 MED FILL — ALPRAZolam 0.5 MG TABS: 0.5 | 20 days supply | Qty: 20 | Fill #0

## 2016-01-11 ENCOUNTER — Ambulatory Visit (INDEPENDENT_AMBULATORY_CARE_PROVIDER_SITE_OTHER): Payer: 59 | Admitting: Family Medicine

## 2016-01-11 ENCOUNTER — Encounter: Payer: Self-pay | Admitting: Family Medicine

## 2016-01-11 VITALS — BP 110/80 | HR 92 | Temp 98.5°F | Ht 67.0 in | Wt 146.8 lb

## 2016-01-11 DIAGNOSIS — R109 Unspecified abdominal pain: Secondary | ICD-10-CM

## 2016-01-11 DIAGNOSIS — R1011 Right upper quadrant pain: Secondary | ICD-10-CM | POA: Diagnosis not present

## 2016-01-11 DIAGNOSIS — W19XXXA Unspecified fall, initial encounter: Secondary | ICD-10-CM | POA: Diagnosis not present

## 2016-01-11 LAB — POC URINALSYSI DIPSTICK (AUTOMATED)
Bilirubin, UA: NEGATIVE
Glucose, UA: NEGATIVE
Ketones, UA: NEGATIVE
Leukocytes, UA: NEGATIVE
Nitrite, UA: NEGATIVE
PH UA: 6.5
PROTEIN UA: NEGATIVE
UROBILINOGEN UA: 1

## 2016-01-11 LAB — BASIC METABOLIC PANEL
BUN: 13 mg/dL (ref 6–23)
CALCIUM: 9 mg/dL (ref 8.4–10.5)
CHLORIDE: 102 meq/L (ref 96–112)
CO2: 30 meq/L (ref 19–32)
CREATININE: 0.79 mg/dL (ref 0.40–1.20)
GFR: 99.82 mL/min (ref >60.00)
Glucose, Bld: 66 mg/dL — ABNORMAL LOW (ref 70–99)
Potassium: 4.3 mEq/L (ref 3.5–5.1)
Sodium: 138 mEq/L (ref 135–145)

## 2016-01-11 NOTE — Progress Notes (Signed)
Pre visit review using our clinic review tool, if applicable. No additional management support is needed unless otherwise documented below in the visit note. 

## 2016-01-11 NOTE — Progress Notes (Signed)
Dr. Karleen HampshireSpencer T. Tahjay Binion, MD, CAQ Sports [facility] [address] [place], [id number] Phone: [phone] Fax: [phone]  01/11/2016  Patient: Cheryl Williams, MRN: [id number], DOB: [birth year], 50 y.o.  Primary Physician:  Nicki ReaperBAITY, REGINA, NP   Chief Complaint  Patient presents with  . Flank Pain    Right-Knocked down playing kick ball  . Fatigue   Subjective:   Cheryl Williams is a 50 y.o. very pleasant female patient who presents with the following:  Playing kickball at a work retreat, and landed more on her right. About 10 days, then about had a different kind of pain. By Thursday or Friday, gave it some time and now it is hurting and a pulling type of pain. Sudden, then will turn over and a sharp pain. No gross hematuria, and the patient is currently menstruating.   She is an [employer] fund[employer].   Pain is primarily just above the right side just above her pelvis in the erector spinae  Musculature.  Sees Dr. Kathi LudwigSyed, Rheum.  Past Medical History, Surgical History, Social History, Family History, Problem List, Medications, and Allergies have been reviewed and updated if relevant.  Patient Active Problem List   Diagnosis Date Noted  . Generalized anxiety disorder 10/03/2014  . GERD (gastroesophageal reflux disease) 10/03/2014  . 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 Insomnia 04/19/2007  . Lupus (systemic lupus erythematosus) (HCC) 04/18/2004  . Raynaud's disease 04/18/2004  . Sjogren's disease (HCC) 04/18/2004    Past Medical History:  Diagnosis Date  . Anemia   . Chicken pox   . GERD (gastroesophageal reflux disease)   . Lupus [facility](HCC)     Past Surgical History:  Procedure Laterality Date  . CHOLECYSTECTOMY  2006    Social History   Social History  . Marital status: Divorced    Spouse name: N/A  . Number of children: N/A  . Years of education: N/A   Occupational History  . Not on file.   Social History Main Topics  . Smoking status:  Never Smoker  . Smokeless tobacco: Never Used  . Alcohol use 0.0 oz/week     Comment: occasional--moderate  . Drug use: No  . Sexual activity: Yes   Other Topics Concern  . Not on file   Social History Narrative  . No narrative on file    Family History  Problem Relation Age of Onset  . Hyperlipidemia Mother   . Hypertension Mother   . Deafness Mother   . Alcohol abuse Father   . Deafness Father   . Drug abuse Sister   . Hypertension Sister   . Drug abuse Brother   . Rheum arthritis Maternal Aunt   . Lupus Maternal Aunt   . Rheum arthritis Maternal Grandmother   . Hypertension Sister     Allergies  Allergen Reactions  . Penicillins      Rash and hives at age 89  . Imuran [Azathioprine] Other (See Comments)    Mental hallucinations    Medication list reviewed and updated in full in [place] [facility].  GEN: No fevers, chills. Nontoxic. Primarily MSK c/o today. MSK: Detailed in the HPI GI: tolerating PO intake without difficulty Neuro: No numbness, parasthesias, or tingling associated. Otherwise the pertinent positives of the ROS are noted above.   Objective:   BP 110/80   Pulse 92   Temp 98.5 F (36.9 C) (Oral)   Ht 5\' 7"  (1.702 m)   Wt 146 lb 12 oz (  66.6 kg)   LMP 01/06/2016   BMI 22.98 kg/m    GEN: WDWN, NAD, Non-toxic, Alert & Oriented x 3 HEENT: Atraumatic, Normocephalic.  Ears and Nose: No external deformity. EXTR: No clubbing/cyanosis/edema ABD: S, NT, ND, + BS, No rebound, No HSM  NEURO: Normal gait.  PSYCH: Normally interactive. Conversant. Not depressed or anxious appearing.  Calm demeanor.    Resisted lifting of the rectus abdominis, internal and external obliques and transversus abdominis are all pain-free.  Patient does have pain with deep palpation of the erector spinae complex on the right side just above the posterior pelvis.  Nontender along all the spinous processes.  Neurovascularly intact.  Radiology: Results for orders placed  or performed in visit on 01/11/16  Basic metabolic panel  Result Value Ref Range   Sodium 138 135 - 145 mEq/L   Potassium 4.3 3.5 - 5.1 mEq/L   Chloride 102 96 - 112 mEq/L   CO2 30 19 - 32 mEq/L   Glucose, Bld 66 (L) 70 - 99 mg/dL   BUN 13 6 - 23 mg/dL   Creatinine, Ser 1.61 0.40 - 1.20 mg/dL   Calcium 9.0 8.4 - 09.6 mg/dL   GFR 04.54 >09.81 mL/min  POCT Urinalysis Dipstick (Automated)  Result Value Ref Range   Color, UA yellow    Clarity, UA clear    Glucose, UA negative    Bilirubin, UA negative    Ketones, UA negative    Spec Grav, UA >=1.030    Blood, UA moderate    pH, UA 6.5    Protein, UA negative    Urobilinogen, UA 1.0    Nitrite, UA negative    Leukocytes, UA Negative Negative     Assessment and Plan:   Acute right flank pain - Plan: Basic metabolic panel, POCT Urinalysis Dipstick (Automated)  Fall, initial encounter  BMP is normal.  Small amount of blood on UA, but the patient is menstruating.  Anticipate muscular impact injury from fall.  Recommended heat, continued time, and anticipate  At least 10 more days of additional time before resolution.  With worsened or heightened pain, please let us know.  Follow-up: No Follow-up on file.  Orders Placed This Encounter  Procedures  . Basic metabolic panel  . POCT Urinalysis Dipstick (Automated)    Signed,  Jaedin Regina T. Sameul Tagle, MD   Patient's Medications  New Prescriptions   No medications on file  Previous Medications   ALPRAZOLAM (XANAX) 0.5 MG TABLET    Take 1 tablet (0.5 mg total) by mouth daily as needed for anxiety. As needed for travel   BETAMETHASONE VALERATE (VALISONE) 0.1 % CREAM    Apply topically 2 (two) times daily.   ETONOGESTREL-ETHINYL ESTRADIOL (NUVARING) 0.12-0.015 MG/24HR VAGINAL RING    Place 1 each vaginally every 28 (twenty-eight) days.   HYDROXYCHLOROQUINE (PLAQUENIL) 200 MG TABLET    Take by mouth daily.   IBUPROFEN (ADVIL,MOTRIN) 600 MG TABLET    Take 1 tablet (600 mg total) by  mouth 3 (three) times daily.   PROPRANOLOL (INDERAL) 10 MG TABLET    Take 1 tablet (10 mg total) by mouth daily as needed.   SUVOREXANT (BELSOMRA) 10 MG TABS    Take 0.5 tablets by mouth at bedtime as needed.   VALACYCLOVIR (VALTREX) 1000 MG TABLET    Take 0.5 tablets (500 mg total) by mouth 2 (two) times daily. Times 3 days for recurrent outbreaks and 1 tab daily for suppressive therapy.  Modified Medications   No  medications on file  Discontinued Medications   No medications on file

## 2016-01-12 ENCOUNTER — Encounter: Payer: Self-pay | Admitting: Family Medicine

## 2016-01-12 ENCOUNTER — Other Ambulatory Visit: Payer: Self-pay | Admitting: Family Medicine

## 2016-01-12 MED ORDER — TRAMADOL HCL 50 MG PO TABS: 50.0000 mg | ORAL_TABLET | Freq: Four times a day (QID) | ORAL | 0 refills | Status: AC | PRN

## 2016-01-12 NOTE — Progress Notes (Signed)
Med call in

## 2016-01-13 MED FILL — traMADol HCL 50 MG TABS: 50 | 10 days supply | Qty: 40 | Fill #0

## 2016-02-03 ENCOUNTER — Encounter: Payer: Self-pay | Admitting: *Deleted

## 2016-02-19 DIAGNOSIS — Z79899 Other long term (current) drug therapy: Secondary | ICD-10-CM | POA: Diagnosis not present

## 2016-02-19 DIAGNOSIS — G4700 Insomnia, unspecified: Secondary | ICD-10-CM | POA: Diagnosis not present

## 2016-02-19 DIAGNOSIS — M329 Systemic lupus erythematosus, unspecified: Secondary | ICD-10-CM | POA: Diagnosis not present

## 2016-02-19 DIAGNOSIS — R5383 Other fatigue: Secondary | ICD-10-CM | POA: Diagnosis not present

## 2016-03-11 ENCOUNTER — Other Ambulatory Visit: Payer: Self-pay | Admitting: Internal Medicine

## 2016-03-11 MED FILL — NUVARING VAGINAL RING: 0.12-0.015 | 90 days supply | Qty: 3 | Fill #0

## 2016-03-11 MED FILL — IBUPROFEN 600 MG TABLET: 600 | 20 days supply | Qty: 60 | Fill #1

## 2016-03-16 MED FILL — BELSOMRA 10 MG TABLET: 10 | 30 days supply | Qty: 30 | Fill #0

## 2016-03-16 MED FILL — PROPRANOLOL 10 MG TABLET: 10 | 30 days supply | Qty: 30 | Fill #0

## 2016-03-16 NOTE — Telephone Encounter (Signed)
Belsomra last filled 12/11/15--please advise

## 2016-03-16 NOTE — Telephone Encounter (Signed)
[place]   * please change class to "phone in"

## 2016-03-24 DIAGNOSIS — L219 Seborrheic dermatitis, unspecified: Secondary | ICD-10-CM | POA: Insufficient documentation

## 2016-03-24 DIAGNOSIS — L281 Prurigo nodularis: Secondary | ICD-10-CM | POA: Diagnosis not present

## 2016-03-24 MED FILL — HYDROCORTISONE 2.5% CREAM: 2.5 | 15 days supply | Qty: 30 | Fill #0

## 2016-03-24 MED FILL — KETOCONAZOLE 2% SHAMPOO: 2 | 30 days supply | Qty: 120 | Fill #0

## 2016-03-24 MED FILL — BETAMETHASONE DP AUG 0.05%: 0.05 | 30 days supply | Qty: 60 | Fill #0

## 2016-04-08 DIAGNOSIS — Z0100 Encounter for examination of eyes and vision without abnormal findings: Secondary | ICD-10-CM | POA: Diagnosis not present

## 2016-05-19 ENCOUNTER — Other Ambulatory Visit: Payer: Self-pay | Admitting: Internal Medicine

## 2016-05-20 NOTE — Telephone Encounter (Signed)
Ok to phone in Belsomra 

## 2016-05-20 NOTE — Telephone Encounter (Signed)
Rx called in to pharmacy. 

## 2016-05-20 NOTE — Telephone Encounter (Signed)
Last filled 03/16/16--please advise if okay to call in

## 2016-06-14 MED FILL — NUVARING VAGINAL RING: 0.12-0.015 | 90 days supply | Qty: 3 | Fill #1

## 2016-06-14 MED FILL — BELSOMRA 10 MG TABLET: 10 | 30 days supply | Qty: 30 | Fill #0

## 2016-06-17 MED FILL — HYDROXYCHLOROQUINE 200 MG T: 200 | 30 days supply | Qty: 30 | Fill #0

## 2016-07-28 MED FILL — HYDROXYCHLOROQUINE 200 MG T: 200 | 30 days supply | Qty: 30 | Fill #1

## 2016-07-29 ENCOUNTER — Other Ambulatory Visit: Payer: Self-pay

## 2016-07-29 MED ORDER — SUVOREXANT 10 MG PO TABS: 0.5000 | ORAL_TABLET | Freq: Every evening | ORAL | 0 refills | Status: DC | PRN

## 2016-07-29 MED FILL — BELSOMRA 10 MG TABLET: 10 | 30 days supply | Qty: 30 | Fill #0

## 2016-07-29 NOTE — Telephone Encounter (Signed)
Rx called in to pharmacy. 

## 2016-07-29 NOTE — Telephone Encounter (Signed)
Approved: 30 x 0 

## 2016-07-29 NOTE — Telephone Encounter (Signed)
Baity pt, last OV CPE 11/2015---Rx last filled 05/2016--please advise

## 2016-08-02 MED FILL — IBUPROFEN 600 MG TABLET: 600 | 20 days supply | Qty: 60 | Fill #2

## 2016-08-08 ENCOUNTER — Other Ambulatory Visit: Payer: Self-pay | Admitting: Family

## 2016-08-08 DIAGNOSIS — A609 Anogenital herpesviral infection, unspecified: Secondary | ICD-10-CM

## 2016-08-18 DIAGNOSIS — L281 Prurigo nodularis: Secondary | ICD-10-CM | POA: Diagnosis not present

## 2016-08-18 DIAGNOSIS — L219 Seborrheic dermatitis, unspecified: Secondary | ICD-10-CM | POA: Diagnosis not present

## 2016-09-01 DIAGNOSIS — G4700 Insomnia, unspecified: Secondary | ICD-10-CM | POA: Diagnosis not present

## 2016-09-01 DIAGNOSIS — N390 Urinary tract infection, site not specified: Secondary | ICD-10-CM | POA: Diagnosis not present

## 2016-09-01 DIAGNOSIS — R5383 Other fatigue: Secondary | ICD-10-CM | POA: Diagnosis not present

## 2016-09-01 DIAGNOSIS — Z79899 Other long term (current) drug therapy: Secondary | ICD-10-CM | POA: Diagnosis not present

## 2016-09-01 DIAGNOSIS — M329 Systemic lupus erythematosus, unspecified: Secondary | ICD-10-CM | POA: Diagnosis not present

## 2016-09-06 ENCOUNTER — Ambulatory Visit: Payer: 59 | Admitting: Internal Medicine

## 2016-10-07 ENCOUNTER — Other Ambulatory Visit: Payer: Self-pay | Admitting: Internal Medicine

## 2016-10-07 DIAGNOSIS — Z1231 Encounter for screening mammogram for malignant neoplasm of breast: Secondary | ICD-10-CM

## 2016-10-14 ENCOUNTER — Other Ambulatory Visit: Payer: Self-pay | Admitting: Internal Medicine

## 2016-10-14 MED FILL — NUVARING VAGINAL RING: 0.12-0.015 | 90 days supply | Qty: 3 | Fill #2

## 2016-10-14 NOTE — Telephone Encounter (Signed)
Last filled 07/29/16... Please advise

## 2016-10-14 NOTE — Telephone Encounter (Signed)
Ok to phone in Belsomra 

## 2016-10-14 NOTE — Telephone Encounter (Signed)
Last filled 12/11/15... Please advise

## 2016-10-14 NOTE — Telephone Encounter (Signed)
Ok to phone in Xanax 

## 2016-10-17 MED FILL — BELSOMRA 10 MG TABLET: 10 | 30 days supply | Qty: 30 | Fill #0

## 2016-10-17 NOTE — Telephone Encounter (Signed)
Rx called in to pharmacy. 

## 2016-10-18 ENCOUNTER — Other Ambulatory Visit: Payer: Self-pay | Admitting: Internal Medicine

## 2016-10-18 MED FILL — ALPRAZolam 0.5 MG TABS: 0.5 | 20 days supply | Qty: 20 | Fill #0

## 2016-10-18 NOTE — Telephone Encounter (Signed)
Rx called in to pharmacy. 

## 2016-10-26 ENCOUNTER — Encounter: Payer: Self-pay | Admitting: Internal Medicine

## 2016-10-26 DIAGNOSIS — Z79899 Other long term (current) drug therapy: Secondary | ICD-10-CM | POA: Diagnosis not present

## 2016-10-26 DIAGNOSIS — A609 Anogenital herpesviral infection, unspecified: Secondary | ICD-10-CM

## 2016-10-26 DIAGNOSIS — M3210 Systemic lupus erythematosus, organ or system involvement unspecified: Secondary | ICD-10-CM | POA: Diagnosis not present

## 2016-10-26 MED ORDER — VALACYCLOVIR HCL 1 G PO TABS: 500.0000 mg | ORAL_TABLET | Freq: Two times a day (BID) | ORAL | 0 refills | Status: DC

## 2016-10-26 MED FILL — valACYclovir HCL 1 GM TABS: 1 | 30 days supply | Qty: 30 | Fill #0

## 2016-12-05 ENCOUNTER — Other Ambulatory Visit: Payer: Self-pay | Admitting: Internal Medicine

## 2016-12-05 ENCOUNTER — Ambulatory Visit: Payer: 59

## 2016-12-05 MED FILL — HYDROXYCHLOROQUINE 200 MG T: 200 | 30 days supply | Qty: 30 | Fill #2

## 2016-12-05 MED FILL — BELSOMRA 10 MG TABLET: 10 | 30 days supply | Qty: 30 | Fill #0

## 2016-12-05 NOTE — Telephone Encounter (Signed)
Rx called in to pharmacy. 

## 2016-12-05 NOTE — Telephone Encounter (Signed)
Ok to phone in [place]

## 2016-12-05 NOTE — Telephone Encounter (Signed)
Last filled 10/14/16... Please advise 

## 2016-12-13 ENCOUNTER — Other Ambulatory Visit: Payer: 59

## 2016-12-15 ENCOUNTER — Ambulatory Visit: Payer: 59

## 2016-12-16 ENCOUNTER — Encounter: Payer: 59 | Admitting: Internal Medicine

## 2017-01-25 ENCOUNTER — Encounter: Payer: 59 | Admitting: Internal Medicine

## 2017-02-03 ENCOUNTER — Other Ambulatory Visit (HOSPITAL_COMMUNITY)
Admission: RE | Admit: 2017-02-03 | Discharge: 2017-02-03 | Disposition: A | Discharge status: Home / Self Care | Payer: 59 | Source: Ambulatory Visit | Attending: Internal Medicine | Admitting: Internal Medicine

## 2017-02-03 ENCOUNTER — Ambulatory Visit (INDEPENDENT_AMBULATORY_CARE_PROVIDER_SITE_OTHER): Payer: 59 | Admitting: Internal Medicine

## 2017-02-03 ENCOUNTER — Encounter: Payer: Self-pay | Admitting: Internal Medicine

## 2017-02-03 VITALS — BP 98/70 | HR 102 | Temp 98.6°F | Ht 67.0 in | Wt 154.0 lb

## 2017-02-03 DIAGNOSIS — F411 Generalized anxiety disorder: Secondary | ICD-10-CM | POA: Diagnosis not present

## 2017-02-03 DIAGNOSIS — Z0000 Encounter for general adult medical examination without abnormal findings: Secondary | ICD-10-CM | POA: Insufficient documentation

## 2017-02-03 DIAGNOSIS — M3500 Sicca syndrome, unspecified: Secondary | ICD-10-CM

## 2017-02-03 DIAGNOSIS — I7300 Raynaud's syndrome without gangrene: Secondary | ICD-10-CM

## 2017-02-03 DIAGNOSIS — F5101 Primary insomnia: Secondary | ICD-10-CM

## 2017-02-03 DIAGNOSIS — Z124 Encounter for screening for malignant neoplasm of cervix: Secondary | ICD-10-CM | POA: Diagnosis not present

## 2017-02-03 DIAGNOSIS — K219 Gastro-esophageal reflux disease without esophagitis: Secondary | ICD-10-CM

## 2017-02-03 DIAGNOSIS — B009 Herpesviral infection, unspecified: Secondary | ICD-10-CM

## 2017-02-03 DIAGNOSIS — M329 Systemic lupus erythematosus, unspecified: Secondary | ICD-10-CM

## 2017-02-03 LAB — COMPREHENSIVE METABOLIC PANEL
AG RATIO: 1.3 (calc) (ref 1.0–2.5)
ALT: 16 U/L (ref 6–29)
AST: 19 U/L (ref 10–35)
Albumin: 4.4 g/dL (ref 3.6–5.1)
Alkaline phosphatase (APISO): 70 U/L (ref 33–115)
BILIRUBIN TOTAL: 0.3 mg/dL (ref 0.2–1.2)
BUN: 15 mg/dL (ref 7–25)
CO2: 30 mmol/L (ref 20–32)
Calcium: 9.4 mg/dL (ref 8.6–10.2)
Chloride: 102 mmol/L (ref 98–110)
Creat: 1.09 mg/dL (ref 0.50–1.10)
GLOBULIN: 3.5 g/dL (ref 1.9–3.7)
GLUCOSE: 88 mg/dL (ref 65–99)
Potassium: 3.7 mmol/L (ref 3.5–5.3)
Sodium: 140 mmol/L (ref 135–146)
Total Protein: 7.9 g/dL (ref 6.1–8.1)

## 2017-02-03 LAB — CBC
HEMATOCRIT: 32.7 % — AB (ref 35.0–45.0)
HEMOGLOBIN: 11 g/dL — AB (ref 11.7–15.5)
MCH: 29.1 pg (ref 27.0–33.0)
MCHC: 33.6 g/dL (ref 32.0–36.0)
MCV: 86.5 fL (ref 80.0–100.0)
MPV: 10.8 fL (ref 7.5–12.5)
Platelets: 263 10*3/uL (ref 140–400)
RBC: 3.78 10*6/uL — AB (ref 3.80–5.10)
RDW: 13.1 % (ref 11.0–15.0)
WBC: 4.1 10*3/uL (ref 3.8–10.8)

## 2017-02-03 LAB — LIPID PANEL
Cholesterol: 192 mg/dL (ref <200)
HDL: 56 mg/dL (ref >50)
LDL Cholesterol (Calc): 107 mg/dL (calc) — ABNORMAL HIGH
NON-HDL CHOLESTEROL (CALC): 136 mg/dL — AB (ref <130)
Total CHOL/HDL Ratio: 3.4 (calc) (ref <5.0)
Triglycerides: 163 mg/dL — ABNORMAL HIGH (ref <150)

## 2017-02-03 NOTE — Assessment & Plan Note (Signed)
Continue Propanolol and Xanax prn

## 2017-02-03 NOTE — Patient Instructions (Signed)

## 2017-02-03 NOTE — Progress Notes (Signed)
Subjective:    Patient ID: Cheryl Williams, female    DOB: 1966/04/08, 51 y.o.   MRN: [id number]  HPI  Pt presents to the clinic today for her annual exam. She is also due to follow up chronic conditions.  Lupus/Sjogrens: She is taking Plaquenil as prescribed. She follows with Dr. Dierdre Forth, rheumatology.  Anxiety: Mainly when she travels, especially flying. She takes Propanolol prior to public speaking and Xanax prior to flying.  GERD: Triggered by acidic foods. She takes Protonix or Tums as needed with good relief.   Insomnia: She has trouble falling asleep, secondary to anxiety. She takes Belsomra as needed with good relief.  HSV 2:  She has not had a recent outbreak. She takes Valtrex as needed.  Raynaud's: Worse in the summer with the [facility]. She is currently not taking any medications for this.  Flu: 01/2017 Tetanus: 2018 at employee health Pap Smear: 06/2013 Mammogram: 11/2015, will schedule Vision Screening: biannually Dentist: biannually  Diet: She eats lean meats. She consumes fruits and veggies daily. She does not eat fried foods. She drinks mostly water. Exercise: None  Review of Systems      Past Medical History:  Diagnosis Date  . Anemia   . Chicken pox   . GERD (gastroesophageal reflux disease)   . Lupus     Current Outpatient Prescriptions  Medication Sig Dispense Refill  . ALPRAZolam (XANAX) 0.5 MG tablet TAKE 1 TABLET BY MOUTH ONCE DAILY AS NEEDED FOR ANXIETY (AS NEEDEDFOR TRAVEL) 20 tablet 0  . BELSOMRA 10 MG TABS TAKE 1/2 TABLET TO 1 TABLET BY MOUTH DAILY AT BEDTIME AS NEEDED 30 tablet 0  . betamethasone valerate (VALISONE) 0.1 % cream Apply topically 2 (two) times daily. 30 g 0  . etonogestrel-ethinyl estradiol (NUVARING) 0.12-0.015 MG/24HR vaginal ring Place 1 each vaginally every 28 (twenty-eight) days. 3 each 3  . hydroxychloroquine (PLAQUENIL) 200 MG tablet Take by mouth daily.    Marland Kitchen ibuprofen (ADVIL,MOTRIN) 600 MG tablet Take 1 tablet (600 mg  total) by mouth 3 (three) times daily. 60 tablet 5  . propranolol (INDERAL) 10 MG tablet TAKE 1 TABLET BY MOUTH DAILY AS NEEDED. 30 tablet 1  . valACYclovir (VALTREX) 1000 MG tablet Take 0.5 tablets (500 mg total) by mouth 2 (two) times daily. Times 3 days for recurrent outbreaks and 1 tab daily for suppressive therapy. 30 tablet 0   No current facility-administered medications for this visit.     Allergies  Allergen Reactions  . Penicillins      Rash and hives at age 37  . Imuran [Azathioprine] Other (See Comments)    Mental hallucinations    Family History  Problem Relation Age of Onset  . Hyperlipidemia Mother   . Hypertension Mother   . Deafness Mother   . Alcohol abuse Father   . Deafness Father   . Drug abuse Sister   . Hypertension Sister   . Drug abuse Brother   . Rheum arthritis Maternal Aunt   . Lupus Maternal Aunt   . Rheum arthritis Maternal Grandmother   . Hypertension Sister     Social History   Social History  . Marital status: Divorced    Spouse name: N/A  . Number of children: N/A  . Years of education: N/A   Occupational History  . Not on file.   Social History Main Topics  . Smoking status: Never Smoker  . Smokeless tobacco: Never Used  . Alcohol use 0.0 oz/week  Comment: occasional--moderate  . Drug use: No  . Sexual activity: Yes   Other Topics Concern  . Not on file   Social History Narrative  . No narrative on file     Constitutional: Denies fever, malaise, fatigue, headache or abrupt weight changes.  HEENT: Denies eye pain, eye redness, ear pain, ringing in the ears, wax buildup, runny nose, nasal congestion, bloody nose, or sore throat. Respiratory: Denies difficulty breathing, shortness of breath, cough or sputum production.   Cardiovascular: Denies chest pain, chest tightness, palpitations or swelling in the hands or feet.  Gastrointestinal: Denies abdominal pain, bloating, constipation, diarrhea or blood in the stool.  GU:  Pt reports irregular periods. Denies urgency, frequency, pain with urination, burning sensation, blood in urine, odor or discharge. Musculoskeletal: Denies decrease in range of motion, difficulty with gait, muscle pain or joint pain and swelling.  Skin: Denies redness, rashes, lesions or ulcercations.  Neurological: Denies dizziness, difficulty with memory, difficulty with speech or problems with balance and coordination.  Psych: Pt reports intermittent anxiety. Denies depression, SI/HI.  No other specific complaints in a complete review of systems (except as listed in HPI above).  Objective:   Physical Exam  BP 98/70 (BP Location: Left Arm, Patient Position: Sitting)   Pulse (!) 102   Temp 98.6 F (37 C) (Oral)   Ht 5\' 7"  (1.702 m)   Wt 154 lb (69.9 kg)   LMP 01/27/2017 (Exact Date)   SpO2 97%   BMI 24.12 kg/m  Wt Readings from Last 3 Encounters:  02/03/17 154 lb (69.9 kg)  01/11/16 146 lb 12 oz (66.6 kg)  12/11/15 145 lb 8 oz (66 kg)    General: Appears her stated age, in NAD. Skin: Warm, dry and intact.  HEENT: Head: normal shape and size; Eyes: sclera white, no icterus, conjunctiva pink, PERRLA and EOMs intact; Ears: Tm's gray and intact, normal light reflex;  Throat/Mouth: Teeth present, mucosa pink and moist, no exudate, lesions or ulcerations noted.  Neck:  Neck supple, trachea midline. No masses, lumps or thyromegaly present.  Cardiovascular: Normal rate and rhythm. S1,S2 noted.  No murmur, rubs or gallops noted. No JVD or BLE edema. Pulmonary/Chest: Normal effort and positive vesicular breath sounds. No respiratory distress. No wheezes, rales or ronchi noted.  Abdomen: Soft and nontender. Normal bowel sounds. No distention or masses noted. Liver, spleen and kidneys non palpable. Pelvic: Normal female anatomy. Cervix without changes. Minimal discharge noted. Adnexa non palpable. Musculoskeletal: Normal range of motion. No signs of joint swelling. No difficulty with gait.    Neurological: Alert and oriented. Cranial nerves II-XII grossly intact. Coordination normal.  Psychiatric: Mood and affect normal. Behavior is normal. Judgment and thought content normal.     BMET    Component Value Date/Time   NA 138 01/11/2016 1041   K 4.3 01/11/2016 1041   CL 102 01/11/2016 1041   CO2 30 01/11/2016 1041   GLUCOSE 66 (L) 01/11/2016 1041   BUN 13 01/11/2016 1041   CREATININE 0.79 01/11/2016 1041   CREATININE 0.86 12/11/2015 1446   CALCIUM 9.0 01/11/2016 1041    Lipid Panel     Component Value Date/Time   CHOL 175 12/11/2015 1446   TRIG 167 (H) 12/11/2015 1446   HDL 73 12/11/2015 1446   CHOLHDL 2.4 12/11/2015 1446   VLDL 33 (H) 12/11/2015 1446   LDLCALC 69 12/11/2015 1446    CBC    Component Value Date/Time   WBC 3.5 (L) 12/11/2015 1446  RBC 3.59 (L) 12/11/2015 1446   HGB 10.2 (L) 12/11/2015 1446   HCT 30.9 (L) 12/11/2015 1446   PLT 258 12/11/2015 1446   MCV 86.1 12/11/2015 1446   MCH 28.4 12/11/2015 1446   MCHC 33.0 12/11/2015 1446   RDW 13.9 12/11/2015 1446    Hgb A1C No results found for: HGBA1C         Assessment & Plan:   Preventative Health Maintenance:  Flu shot UTD Tetanus UTD Pap smear today, she declines STD screening She will call to schedule her mammogram Encouraged her to consume a balanced diet and exercise regimen Advised her to see an eye doctor and dentist annually Will check CBC, CMET, Lipid profile today  RTC in 1 year, sooner if needed Nicki ReaperBAITY, REGINA, NP

## 2017-02-03 NOTE — Assessment & Plan Note (Signed)
Continue Valtrex prn 

## 2017-02-03 NOTE — Assessment & Plan Note (Signed)
Continue Belsomra prn

## 2017-02-03 NOTE — Assessment & Plan Note (Signed)
On Plaquenil She will continue to follow with rheumatology 

## 2017-02-03 NOTE — Assessment & Plan Note (Signed)
Continue Protonix and Tums as needed

## 2017-02-03 NOTE — Assessment & Plan Note (Signed)
She is not interested in treatment at this time Will monitor 

## 2017-02-03 NOTE — Assessment & Plan Note (Signed)
On Plaquenil She will continue to follow with rheumatology

## 2017-02-07 LAB — CYTOLOGY - PAP
Bacterial vaginitis: POSITIVE — AB
Candida vaginitis: NEGATIVE
Chlamydia: NEGATIVE
DIAGNOSIS: NEGATIVE
HPV (WINDOPATH): NOT DETECTED
Neisseria Gonorrhea: NEGATIVE
Trichomonas: NEGATIVE

## 2017-02-08 ENCOUNTER — Encounter: Payer: Self-pay | Admitting: Internal Medicine

## 2017-02-08 ENCOUNTER — Other Ambulatory Visit: Payer: Self-pay | Admitting: Internal Medicine

## 2017-02-08 MED ORDER — METRONIDAZOLE 0.75 % VA GEL: 1.0000 | Freq: Two times a day (BID) | VAGINAL | 0 refills | Status: DC

## 2017-02-10 LAB — CERVICOVAGINAL ANCILLARY ONLY: HERPES (WINDOWPATH): NEGATIVE

## 2017-02-16 ENCOUNTER — Encounter: Payer: Self-pay | Admitting: Internal Medicine

## 2017-02-16 DIAGNOSIS — B9689 Other specified bacterial agents as the cause of diseases classified elsewhere: Secondary | ICD-10-CM

## 2017-02-16 DIAGNOSIS — N760 Acute vaginitis: Principal | ICD-10-CM

## 2017-02-21 ENCOUNTER — Other Ambulatory Visit: Payer: Self-pay | Admitting: Sports Medicine

## 2017-02-21 ENCOUNTER — Other Ambulatory Visit: Payer: Self-pay | Admitting: Internal Medicine

## 2017-02-21 MED ORDER — METRONIDAZOLE 500 MG PO TABS: 500.0000 mg | ORAL_TABLET | Freq: Two times a day (BID) | ORAL | 0 refills | Status: DC

## 2017-02-21 MED FILL — HYDROXYCHLOROQUINE 200 MG T: 200 | 30 days supply | Qty: 30 | Fill #3

## 2017-02-21 MED FILL — IBUPROFEN 600 MG TABLET: 600 | 20 days supply | Qty: 60 | Fill #0

## 2017-02-21 MED FILL — metroNIDAZOLE 500 MG TABS: 500 | 5 days supply | Qty: 10 | Fill #0

## 2017-02-22 NOTE — Telephone Encounter (Signed)
Belsomra last filled 12/05/2016... Please advise

## 2017-02-23 ENCOUNTER — Other Ambulatory Visit: Payer: Self-pay | Admitting: Internal Medicine

## 2017-02-23 MED FILL — NUVARING VAGINAL RING: 0.12-0.015 | 90 days supply | Qty: 3 | Fill #0

## 2017-02-23 NOTE — Telephone Encounter (Signed)
Rx called in to pharmacy. 

## 2017-02-23 NOTE — Telephone Encounter (Signed)
nuvaring sent electronically. Ok to [place].

## 2017-02-24 MED FILL — BELSOMRA 10 MG TABLET: 10 | 30 days supply | Qty: 30 | Fill #0

## 2017-03-03 DIAGNOSIS — Z79899 Other long term (current) drug therapy: Secondary | ICD-10-CM | POA: Diagnosis not present

## 2017-03-03 DIAGNOSIS — R5383 Other fatigue: Secondary | ICD-10-CM | POA: Diagnosis not present

## 2017-03-03 DIAGNOSIS — G4700 Insomnia, unspecified: Secondary | ICD-10-CM | POA: Diagnosis not present

## 2017-03-03 DIAGNOSIS — M329 Systemic lupus erythematosus, unspecified: Secondary | ICD-10-CM | POA: Diagnosis not present

## 2017-03-28 ENCOUNTER — Ambulatory Visit: Payer: 59

## 2017-04-27 ENCOUNTER — Other Ambulatory Visit: Payer: Self-pay | Admitting: Internal Medicine

## 2017-04-27 MED FILL — HYDROXYCHLOROQUINE 200 MG: 200 | 30 days supply | Qty: 30 | Fill #4

## 2017-04-27 MED FILL — IBUPROFEN 600 MG TABLET: 600 | 20 days supply | Qty: 60 | Fill #1

## 2017-04-27 NOTE — Telephone Encounter (Signed)
Last filled 02/23/17... Please advise 

## 2017-04-28 MED FILL — BELSOMRA 10 MG TABLET: 10 | 30 days supply | Qty: 30 | Fill #0

## 2017-05-04 ENCOUNTER — Ambulatory Visit
Admission: RE | Admit: 2017-05-04 | Discharge: 2017-05-04 | Disposition: A | Discharge status: Home / Self Care | Payer: No Typology Code available for payment source | Source: Ambulatory Visit | Attending: Internal Medicine | Admitting: Internal Medicine

## 2017-05-04 DIAGNOSIS — Z1231 Encounter for screening mammogram for malignant neoplasm of breast: Secondary | ICD-10-CM

## 2017-07-03 ENCOUNTER — Other Ambulatory Visit: Payer: Self-pay | Admitting: Internal Medicine

## 2017-07-04 ENCOUNTER — Telehealth: Payer: No Typology Code available for payment source | Admitting: Family

## 2017-07-04 DIAGNOSIS — A499 Bacterial infection, unspecified: Secondary | ICD-10-CM | POA: Diagnosis not present

## 2017-07-04 DIAGNOSIS — N390 Urinary tract infection, site not specified: Secondary | ICD-10-CM

## 2017-07-04 MED ORDER — NITROFURANTOIN MONOHYD MACRO 100 MG PO CAPS: 100.0000 mg | ORAL_CAPSULE | Freq: Two times a day (BID) | ORAL | 0 refills | Status: DC

## 2017-07-04 MED FILL — NITROFURANTOIN MONO-MCR 100: 100 | 5 days supply | Qty: 10 | Fill #0

## 2017-07-04 NOTE — Progress Notes (Signed)

## 2017-07-05 MED ORDER — ALPRAZOLAM 0.5 MG PO TABS: 0.5000 mg | ORAL_TABLET | Freq: Every day | ORAL | 0 refills | Status: DC | PRN

## 2017-07-05 MED ORDER — SUVOREXANT 10 MG PO TABS: 0.5000 | ORAL_TABLET | Freq: Every evening | ORAL | 0 refills | Status: DC | PRN

## 2017-07-05 MED FILL — ALPRAZolam 0.5 MG TABS: 0.5 | 20 days supply | Qty: 20 | Fill #0

## 2017-07-05 MED FILL — BELSOMRA 10 MG TABLET: 10 | 30 days supply | Qty: 30 | Fill #0

## 2017-07-05 NOTE — Telephone Encounter (Signed)
Belsomra last filled 04/28/2017 and Xanax last filled 09/2016 to take for PRN for traveling... Please advise

## 2017-08-21 MED FILL — IBUPROFEN 600 MG TABLET: 600 | 20 days supply | Qty: 60 | Fill #2

## 2017-08-21 MED FILL — HYDROXYCHLOROQUINE SULFATE: 200 | 90 days supply | Qty: 90 | Fill #0

## 2017-09-04 ENCOUNTER — Other Ambulatory Visit: Payer: Self-pay | Admitting: Internal Medicine

## 2017-09-04 NOTE — Telephone Encounter (Signed)
Last filled 07/05/2017... Please advise 

## 2017-09-05 MED FILL — BELSOMRA 10 MG TABLET: 10 | 30 days supply | Qty: 30 | Fill #0

## 2017-09-07 ENCOUNTER — Telehealth: Payer: Self-pay

## 2017-09-07 DIAGNOSIS — M329 Systemic lupus erythematosus, unspecified: Secondary | ICD-10-CM

## 2017-09-07 DIAGNOSIS — M3500 Sicca syndrome, unspecified: Secondary | ICD-10-CM

## 2017-09-07 NOTE — Telephone Encounter (Signed)
Copied from CRM [id number]. Topic: Inquiry >> Sep 07, 2017  9:45 AM Windy Kalata, NT wrote: Reason for CRM: patient is calling and states that she has a appt with her rheumatologist today and states she needed to let Sampson Si know for insurance purposes.

## 2017-09-07 NOTE — Addendum Note (Signed)
Addended by: Lorre Munroe on: 09/07/2017 09:54 AM   Modules accepted: Orders

## 2017-09-07 NOTE — Telephone Encounter (Signed)
Referral placed.

## 2017-11-06 ENCOUNTER — Ambulatory Visit: Payer: No Typology Code available for payment source | Admitting: Podiatry

## 2017-11-06 ENCOUNTER — Ambulatory Visit (INDEPENDENT_AMBULATORY_CARE_PROVIDER_SITE_OTHER): Payer: No Typology Code available for payment source

## 2017-11-06 ENCOUNTER — Other Ambulatory Visit: Payer: Self-pay | Admitting: Podiatry

## 2017-11-06 ENCOUNTER — Encounter: Payer: Self-pay | Admitting: Podiatry

## 2017-11-06 DIAGNOSIS — M21619 Bunion of unspecified foot: Secondary | ICD-10-CM

## 2017-11-06 DIAGNOSIS — M2011 Hallux valgus (acquired), right foot: Secondary | ICD-10-CM

## 2017-11-06 DIAGNOSIS — M2012 Hallux valgus (acquired), left foot: Secondary | ICD-10-CM

## 2017-11-06 NOTE — Patient Instructions (Signed)

## 2017-11-06 NOTE — Progress Notes (Signed)
Subjective:   Patient ID: Cheryl PersiaJeannette A Noblet, female   DOB: 52 y.o.   MRN: [id number]   HPI Patient presents stating her bunion is really starting to bother me on the right foot and I have been trying wider shoes soaks and anti-inflammatories as needed without relief and also is concerned about my nail on the left big toe.  Patient does not smoke and likes to be active   Review of Systems  All other systems reviewed and are negative.       Objective:  Physical Exam  Constitutional: She appears well-developed and well-nourished.  Cardiovascular: Intact distal pulses.  Pulmonary/Chest: Effort normal.  Musculoskeletal: Normal range of motion.  Neurological: She is alert.  Skin: Skin is warm.  Nursing note and vitals reviewed.   Neurovascular status intact muscle strength is adequate range of motion within normal limits with patient found to have hyperostosis with redness and pain around the first metatarsal head right with patient noted to have mild nail irritation left.  The bunion is red and it sore when palpated and is making it increasingly difficult to wear shoe and patient was found to have good digital perfusion and is well oriented x3     Assessment:  Significant structural HAV deformity right foot with pain with mild nail disease left hallux     Plan:  H&P and x-rays reviewed.  Working to focus on the right foot and I did explain to her structural bunion deformity and the family history that she has associated with this.  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  I reviewed a distal osteotomy right educating her on this and she will reappoint for consult and is tentatively scheduled for surgery in the next 3 to 4 weeks and will bring in any questions she may have  X-rays indicate that the patient does have elevation of the intermetatarsal angle right over left with  structural enlargement of the joint bony irritation

## 2017-11-22 ENCOUNTER — Encounter: Payer: Self-pay | Admitting: Podiatry

## 2017-11-22 ENCOUNTER — Ambulatory Visit: Payer: No Typology Code available for payment source | Admitting: Podiatry

## 2017-11-22 DIAGNOSIS — M21619 Bunion of unspecified foot: Secondary | ICD-10-CM

## 2017-11-22 DIAGNOSIS — M21612 Bunion of left foot: Secondary | ICD-10-CM

## 2017-11-22 NOTE — Progress Notes (Signed)
Subjective:   Patient ID: Cheryl Williams, female   DOB: 52 y.o.   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  Patient wants surgery and at this point I allowed her to read consent form going over alternative treatments complications.  She has not had any issues with her lupus or her ray nods and it should not contribute to any healing problems but I did explain the risk of this and all other complications as outlined.  She completely understands risk and signed consent form and understands total recovery can take approximately 6 months.  Patient is scheduled for outpatient surgery and is encouraged to call with any questions in today air fracture walker was dispensed to the patient with all instructions on usage

## 2017-11-22 NOTE — Patient Instructions (Signed)
Pre-Operative Instructions  Congratulations, you have decided to take an important step towards improving your quality of life.  You can be assured that the doctors and staff at [facility] will be with you every step of the way.  Here are some important things you should know:  1. Plan to be at the surgery center/hospital at least 1 (one) hour prior to your scheduled time, unless otherwise directed by the surgical center/hospital staff.  You must have a responsible adult accompany you, remain during the surgery and drive you home.  Make sure you have directions to the surgical center/hospital to ensure you arrive on time. 2. If you are having surgery at [facility] or [facility], you will need a copy of your medical history and physical form from your family physician within one month prior to the date of surgery. We will give you a form for your primary physician to complete.  3. We make every effort to accommodate the date you request for surgery.  However, there are times where surgery dates or times have to be moved.  We will contact you as soon as possible if a change in schedule is required.   4. No aspirin/ibuprofen for one week before surgery.  If you are on aspirin, any non-steroidal anti-inflammatory medications (Mobic, Aleve, Ibuprofen) should not be taken seven (7) days prior to your surgery.  You make take Tylenol for pain prior to surgery.  5. Medications - If you are taking daily heart and blood pressure medications, seizure, reflux, allergy, asthma, anxiety, pain or diabetes medications, make sure you notify the surgery center/hospital before the day of surgery so they can tell you which medications you should take or avoid the day of surgery. 6. No food or drink after midnight the night before surgery unless directed otherwise by surgical center/hospital staff. 7. No alcoholic beverages 24-hours prior to surgery.  No smoking 24-hours prior or 24-hours after  surgery. 8. Wear loose pants or shorts. They should be loose enough to fit over bandages, boots, and casts. 9. Don't wear slip-on shoes. Sneakers are preferred. 10. Bring your boot with you to the surgery center/hospital.  Also bring crutches or a walker if your physician has prescribed it for you.  If you do not have this equipment, it will be provided for you after surgery. 11. If you have not been contacted by the surgery center/hospital by the day before your surgery, call to confirm the date and time of your surgery. 12. Leave-time from work may vary depending on the type of surgery you have.  Appropriate arrangements should be made prior to surgery with your employer. 13. Prescriptions will be provided immediately following surgery by your doctor.  Fill these as soon as possible after surgery and take the medication as directed. Pain medications will not be refilled on weekends and must be approved by the doctor. 14. Remove nail polish on the operative foot and avoid getting pedicures prior to surgery. 15. Wash the night before surgery.  The night before surgery wash the foot and leg well with water and the antibacterial soap provided. Be sure to pay special attention to beneath the toenails and in between the toes.  Wash for at least three (3) minutes. Rinse thoroughly with water and dry well with a towel.  Perform this wash unless told not to do so by your physician.  Enclosed: 1 Ice pack (please put in freezer the night before surgery)   1 Hibiclens skin cleaner     Pre-op instructions  If you have any questions regarding the instructions, please do not hesitate to call our office.  [place]: [address], [place], [place] [address] -- [phone]  [place]: [address] [place], [place] [address] -- [phone]  [place]: [address]  [place], [place] [address] -- [phone]  [place]: [address], [address], [place], [place] [address] -- [phone]  Website:  [email or web address] 

## 2017-11-23 ENCOUNTER — Other Ambulatory Visit: Payer: Self-pay | Admitting: Internal Medicine

## 2017-11-24 NOTE — Telephone Encounter (Signed)
Last filled 09/05/2017 #30... Please advise

## 2017-11-25 MED ORDER — SUVOREXANT 10 MG PO TABS: 1.0000 | ORAL_TABLET | Freq: Every day | ORAL | 0 refills | Status: DC

## 2017-11-27 MED FILL — BELSOMRA 10 MG TABLET: 10 | 30 days supply | Qty: 30 | Fill #0

## 2017-11-28 ENCOUNTER — Telehealth: Payer: Self-pay | Admitting: *Deleted

## 2017-11-28 NOTE — Telephone Encounter (Signed)
"  I am scheduled to have a bunionectomy with Dr. Charlsie Merlesegal on August 27, I believe it is.  I want to see about pushing that out until after my cruise.  I want to do it after October 14.  Call me back and let me know what my options are."

## 2017-11-30 NOTE — Telephone Encounter (Signed)
I am returning your call.  "I want to reschedule my surgery from August 27 to sometime after October 14."  I am sorry I haven't gotten back with you yet.  "No problem, I understand."  Dr. Charlsie Merlesegal can do it on October 15.  "What does he have in December.  I want to do it before my deductible starts over."  He can do it any Tuesday in December but I am not sure about December 24.  "The following Tuesday is New Years Eve isn't it?"  Yes, it is.  "Let me call you back tomorrow I need to check my calendar.  Leave it as is for now."

## 2017-12-05 NOTE — Telephone Encounter (Signed)
Patients postop visit has been rescheduled to Wednesday, 23 October at 1:15 pm.

## 2017-12-05 NOTE — Telephone Encounter (Signed)
"  I have decided that I do want to reschedule my surgery.  I would like to do it on October 15.  The surgery center called me yesterday and I told them but they told me that I needed to let you know.  Is that date okay?"  Yes, it is available.  I'll get it rescheduled to 01/30/2018.  "I see that I have a casting appointment scheduled.  Will you reschedule that as well?"  Yes, that was your post-op appointment.  Cheryl Williams will change it.  "Okay, thank you."

## 2017-12-07 ENCOUNTER — Telehealth: Payer: No Typology Code available for payment source | Admitting: Physician Assistant

## 2017-12-07 DIAGNOSIS — R300 Dysuria: Secondary | ICD-10-CM | POA: Diagnosis not present

## 2017-12-07 NOTE — Progress Notes (Signed)
Message sent to patient requesting further information regarding her comments.

## 2017-12-08 MED ORDER — NITROFURANTOIN MONOHYD MACRO 100 MG PO CAPS: 100.0000 mg | ORAL_CAPSULE | Freq: Two times a day (BID) | ORAL | 0 refills | Status: DC

## 2017-12-08 NOTE — Addendum Note (Signed)
Addended by: Waldon MerlMARTIN, Dayan Desa C on: 12/08/2017 07:35 AM   Modules accepted: Orders

## 2017-12-08 NOTE — Progress Notes (Signed)

## 2017-12-20 ENCOUNTER — Other Ambulatory Visit: Payer: No Typology Code available for payment source

## 2017-12-21 MED FILL — IBUPROFEN 600 MG TABLET: 600 | 20 days supply | Qty: 60 | Fill #3

## 2018-01-24 ENCOUNTER — Telehealth: Payer: Self-pay | Admitting: *Deleted

## 2018-01-24 NOTE — Telephone Encounter (Signed)
I called [employer] to check benefits and to get authorization for the patient's surgery that is scheduled for 01/30/2018.  She gave me an authorization number, [id number].  I called and informed Aram Beecham at the surgical center.

## 2018-01-30 ENCOUNTER — Encounter: Payer: Self-pay | Admitting: Podiatry

## 2018-01-30 DIAGNOSIS — M2011 Hallux valgus (acquired), right foot: Secondary | ICD-10-CM

## 2018-01-30 MED FILL — OXYCODONE-ACETAMINOPHEN 10-: 10-325 | 4 days supply | Qty: 25 | Fill #0

## 2018-01-30 MED FILL — ONDANSETRON HCL 4 MG TABLET: 4 | 7 days supply | Qty: 20 | Fill #0

## 2018-02-07 ENCOUNTER — Ambulatory Visit (INDEPENDENT_AMBULATORY_CARE_PROVIDER_SITE_OTHER): Payer: No Typology Code available for payment source | Admitting: Podiatry

## 2018-02-07 ENCOUNTER — Ambulatory Visit (INDEPENDENT_AMBULATORY_CARE_PROVIDER_SITE_OTHER): Payer: No Typology Code available for payment source

## 2018-02-07 ENCOUNTER — Encounter: Payer: Self-pay | Admitting: Podiatry

## 2018-02-07 VITALS — Temp 97.8°F

## 2018-02-07 DIAGNOSIS — M21619 Bunion of unspecified foot: Secondary | ICD-10-CM

## 2018-02-08 ENCOUNTER — Telehealth: Payer: Self-pay | Admitting: Podiatry

## 2018-02-08 NOTE — Progress Notes (Signed)
Subjective:   Patient ID: Cheryl Williams, female   DOB: 52 y.o.   MRN: [id number]   HPI Patient states she is doing very well with surgery with minimal discomfort and is able to walk without chronic pain and is utilizing her boot   ROS      Objective:  Physical Exam  Neurovascular status intact negative Homans sign noted with patient's right foot healing well with wound edges well coapted hallux in rectus position good range of motion for this.  Postop     Assessment:  Doing well post osteotomy first metatarsal right with good alignment correction and wound edges well coapted     Plan:  H&P x-ray reviewed and reapplied sterile dressing continue compression elevation immobilization and dispensed surgical shoe Ace wrap to use and was encouraged to call with any questions and will be seen back in 3 weeks for hopefully she can return to soft shoe gear  X-ray indicates that the osteotomy is healing well fixation in place joint congruence alignment good

## 2018-02-08 NOTE — Telephone Encounter (Signed)
I saw Dr. Charlsie Merles for my first postop yesterday and he instructed me to do an exercise on my big toe 5 times a day. He did not say how long I need to perform that exercise for each time of the day I do the exercise. Please call me back at [phone]. Thank you.

## 2018-02-18 ENCOUNTER — Other Ambulatory Visit: Payer: Self-pay | Admitting: Internal Medicine

## 2018-02-19 MED ORDER — SUVOREXANT 10 MG PO TABS: 1.0000 | ORAL_TABLET | Freq: Every day | ORAL | 0 refills | Status: DC

## 2018-02-19 MED FILL — BELSOMRA 10 MG TABLET: 10 | 30 days supply | Qty: 30 | Fill #0

## 2018-02-19 NOTE — Telephone Encounter (Signed)
Last filled 11/2017... Pt has appt for CPE 03/12/18... Please advise

## 2018-02-28 ENCOUNTER — Other Ambulatory Visit: Payer: Self-pay | Admitting: Podiatry

## 2018-02-28 ENCOUNTER — Ambulatory Visit (INDEPENDENT_AMBULATORY_CARE_PROVIDER_SITE_OTHER): Payer: No Typology Code available for payment source | Admitting: Podiatry

## 2018-02-28 ENCOUNTER — Ambulatory Visit (INDEPENDENT_AMBULATORY_CARE_PROVIDER_SITE_OTHER): Payer: No Typology Code available for payment source

## 2018-02-28 ENCOUNTER — Encounter: Payer: Self-pay | Admitting: Podiatry

## 2018-02-28 DIAGNOSIS — M21611 Bunion of right foot: Secondary | ICD-10-CM | POA: Diagnosis not present

## 2018-02-28 DIAGNOSIS — M2011 Hallux valgus (acquired), right foot: Secondary | ICD-10-CM

## 2018-02-28 DIAGNOSIS — M79671 Pain in right foot: Secondary | ICD-10-CM

## 2018-02-28 DIAGNOSIS — M21619 Bunion of unspecified foot: Secondary | ICD-10-CM

## 2018-02-28 NOTE — Progress Notes (Signed)
Subjective:   Patient ID: Cheryl Williams, female   DOB: 52 y.o.   MRN: [id number]   HPI Patient states doing very well with her right foot with minimal discomfort or swelling and very satisfied with where she is added with patient wearing shoe gear at the current time   ROS      Objective:  Physical Exam  Neurovascular status intact with patient's right first MPJ healing well with wound edges well coapted hallux in rectus position with good range of motion     Assessment:  Doing well post foot surgery right     Plan:  Reviewed condition and recommended continuation range of motion exercises compression stocking which was dispensed and gradual increase in activities  X-rays indicate excellent healing of the osteotomy with fixation in place and no signs of pathology

## 2018-03-12 ENCOUNTER — Encounter: Payer: Self-pay | Admitting: Internal Medicine

## 2018-03-12 ENCOUNTER — Ambulatory Visit (INDEPENDENT_AMBULATORY_CARE_PROVIDER_SITE_OTHER): Payer: No Typology Code available for payment source | Admitting: Internal Medicine

## 2018-03-12 VITALS — BP 104/68 | HR 80 | Temp 98.0°F | Ht 67.0 in | Wt 139.0 lb

## 2018-03-12 DIAGNOSIS — I7300 Raynaud's syndrome without gangrene: Secondary | ICD-10-CM

## 2018-03-12 DIAGNOSIS — M3500 Sicca syndrome, unspecified: Secondary | ICD-10-CM

## 2018-03-12 DIAGNOSIS — K219 Gastro-esophageal reflux disease without esophagitis: Secondary | ICD-10-CM

## 2018-03-12 DIAGNOSIS — F5101 Primary insomnia: Secondary | ICD-10-CM

## 2018-03-12 DIAGNOSIS — Z0000 Encounter for general adult medical examination without abnormal findings: Secondary | ICD-10-CM

## 2018-03-12 DIAGNOSIS — M329 Systemic lupus erythematosus, unspecified: Secondary | ICD-10-CM

## 2018-03-12 DIAGNOSIS — B009 Herpesviral infection, unspecified: Secondary | ICD-10-CM

## 2018-03-12 DIAGNOSIS — Z1211 Encounter for screening for malignant neoplasm of colon: Secondary | ICD-10-CM

## 2018-03-12 DIAGNOSIS — F411 Generalized anxiety disorder: Secondary | ICD-10-CM

## 2018-03-12 LAB — COMPREHENSIVE METABOLIC PANEL
ALBUMIN: 4.5 g/dL (ref 3.5–5.2)
ALT: 11 U/L (ref 0–35)
AST: 16 U/L (ref 0–37)
Alkaline Phosphatase: 60 U/L (ref 39–117)
BILIRUBIN TOTAL: 0.5 mg/dL (ref 0.2–1.2)
BUN: 11 mg/dL (ref 6–23)
CHLORIDE: 103 meq/L (ref 96–112)
CO2: 29 meq/L (ref 19–32)
CREATININE: 0.69 mg/dL (ref 0.40–1.20)
Calcium: 9.5 mg/dL (ref 8.4–10.5)
GFR: 115.65 mL/min (ref >60.00)
Glucose, Bld: 89 mg/dL (ref 70–99)
Potassium: 3.6 mEq/L (ref 3.5–5.1)
SODIUM: 139 meq/L (ref 135–145)
Total Protein: 8.1 g/dL (ref 6.0–8.3)

## 2018-03-12 LAB — LIPID PANEL
CHOLESTEROL: 187 mg/dL (ref 0–200)
HDL: 59.1 mg/dL (ref >39.00)
LDL CALC: 112 mg/dL — AB (ref 0–99)
NonHDL: 127.99
TRIGLYCERIDES: 82 mg/dL (ref 0.0–149.0)
Total CHOL/HDL Ratio: 3
VLDL: 16.4 mg/dL (ref 0.0–40.0)

## 2018-03-12 LAB — CBC
HCT: 31.7 % — ABNORMAL LOW (ref 36.0–46.0)
Hemoglobin: 10.6 g/dL — ABNORMAL LOW (ref 12.0–15.0)
MCHC: 33.4 g/dL (ref 30.0–36.0)
MCV: 87.6 fl (ref 78.0–100.0)
PLATELETS: 255 10*3/uL (ref 150.0–400.0)
RBC: 3.62 Mil/uL — AB (ref 3.87–5.11)
RDW: 13.3 % (ref 11.5–15.5)
WBC: 2.8 10*3/uL — AB (ref 4.0–10.5)

## 2018-03-12 LAB — VITAMIN D 25 HYDROXY (VIT D DEFICIENCY, FRACTURES): VITD: 15.77 ng/mL — AB (ref 30.00–100.00)

## 2018-03-12 NOTE — Patient Instructions (Signed)

## 2018-03-12 NOTE — Assessment & Plan Note (Signed)
Controlled with prn Propanolol and Xanax Support offer today Will monitor

## 2018-03-12 NOTE — Assessment & Plan Note (Signed)
Controlled on Plaquenil CMET today She will continue to follow with rheumatology

## 2018-03-12 NOTE — Assessment & Plan Note (Signed)
Continue Valtrex prn 

## 2018-03-12 NOTE — Assessment & Plan Note (Signed)
Continue Plaquenil for now CBC and CMET today She will continue to follow with Dr. Kathi LudwigSyed

## 2018-03-12 NOTE — Progress Notes (Signed)
Subjective:    Patient ID: Cheryl Williams, female    DOB: June 25, 1965, 52 y.o.   MRN: [id number]  HPI  Pt presents to the clinic today for her annual exam. She is also due to follow up chronic conditions.  Lupus/Sjogrens: She takes Plaquenil as prescribed. She follows with rheumatology, Dr. Kathi Ludwig.  Anxiety: Mainly when she travels, especially flying. She takes Propanolol prior to public speaking and Xanax prior to flying.  GERD: Triggered by acidic foods She takes Pantoprazole/Tums as needed with good relief. There is no upper GI on file.  Insomnia: She has trouble falling asleep, secondary to insomnia. She takes Belsomra as needed with good relief.  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 She takes Valtrex as needed.  Raynaud's: Worse in the summer with the [facility]. She does not take any medications for Raynauds at this time.  Flu: 01/2018 Tetanus: < 10 years ago Pap Smear: 01/2017 Mammogram: 04/2017 Colon Screening: never Vision Screening: biannually Dentist: biannually  Diet: She does eat meat. She consumes fruits and veggies daily. She does eat some fried foods. She drinks mostly water. Exercise: Walking  Review of Systems  Past Medical History:  Diagnosis Date  . Anemia   . Chicken pox   . GERD (gastroesophageal reflux disease)   . Lupus (HCC)     Current Outpatient Medications  Medication Sig Dispense Refill  . ALPRAZolam (XANAX) 0.5 MG tablet Take 1 tablet (0.5 mg total) by mouth daily as needed for anxiety. 20 tablet 0  . betamethasone valerate (VALISONE) 0.1 % cream Apply topically 2 (two) times daily. 30 g 0  . betamethasone, augmented, (DIPROLENE) 0.05 % lotion Apply to scalp 3 times per week as needed for itching. Not to face.    . Biotin 5000 MCG CAPS Take by mouth daily.    . hydroxychloroquine (PLAQUENIL) 200 MG tablet Take by mouth daily.    Marland Kitchen ibuprofen (ADVIL,MOTRIN) 600 MG tablet TAKE 1 TABLET BY MOUTH 3 TIMES DAILY. 60 tablet 5  . nitrofurantoin,  macrocrystal-monohydrate, (MACROBID) 100 MG capsule Take 1 capsule (100 mg total) by mouth 2 (two) times daily. 10 capsule 0  . NUVARING 0.12-0.015 MG/24HR vaginal ring PLACE 1 RING VAGINALLY EVERY 28 DAYS. 3 each 3  . ondansetron (ZOFRAN) 4 MG tablet Take 4 mg by mouth every 8 (eight) hours as needed. for nausea  0  . oxyCODONE-acetaminophen (PERCOCET) 10-325 MG tablet TAKE 1 TABLET BY MOUTH EVERY 4-6 HOURS AS NEEDED FOR PAIN  0  . propranolol (INDERAL) 10 MG tablet TAKE 1 TABLET BY MOUTH DAILY AS NEEDED. 30 tablet 1  . Suvorexant (BELSOMRA) 10 MG TABS Take 1 tablet by mouth at bedtime. MUST SCHEDULE ANNUAL EXAM October 30 tablet 0  . valACYclovir (VALTREX) 1000 MG tablet Take 0.5 tablets (500 mg total) by mouth 2 (two) times daily. Times 3 days for recurrent outbreaks and 1 tab daily for suppressive therapy. 30 tablet 0   No current facility-administered medications for this visit.     Allergies  Allergen Reactions  . Penicillins      Rash and hives at age 87  . Imuran [Azathioprine] Other (See Comments)    Mental hallucinations    Family History  Problem Relation Age of Onset  . Hyperlipidemia Mother   . Hypertension Mother   . Deafness Mother   . Alcohol abuse Father   . Deafness Father   . Drug abuse Sister   . Hypertension Sister   . Drug abuse Brother   .  Rheum arthritis Maternal Aunt   . Lupus Maternal Aunt   . Rheum arthritis Maternal Grandmother   . Hypertension Sister     Social History   Socioeconomic History  . Marital status: Divorced    Spouse name: Not on file  . Number of children: Not on file  . Years of education: Not on file  . Highest education level: Not on file  Occupational History  . Not on file  Social Needs  . Financial resource strain: Not on file  . Food insecurity:    Worry: Not on file    Inability: Not on file  . Transportation needs:    Medical: Not on file    Non-medical: Not on file  Tobacco Use  . Smoking status: Never Smoker    . Smokeless tobacco: Never Used  Substance and Sexual Activity  . Alcohol use: Yes    Alcohol/week: 0.0 standard drinks    Comment: occasional--moderate  . Drug use: No  . Sexual activity: Yes  Lifestyle  . Physical activity:    Days per week: Not on file    Minutes per session: Not on file  . Stress: Not on file  Relationships  . Social connections:    Talks on phone: Not on file    Gets together: Not on file    Attends religious service: Not on file    Active member of club or organization: Not on file    Attends meetings of clubs or organizations: Not on file    Relationship status: Not on file  . Intimate partner violence:    Fear of current or ex partner: Not on file    Emotionally abused: Not on file    Physically abused: Not on file    Forced sexual activity: Not on file  Other Topics Concern  . Not on file  Social History Narrative  . Not on file     Constitutional: Denies fever, malaise, fatigue, headache or abrupt weight changes.  HEENT: Denies eye pain, eye redness, ear pain, ringing in the ears, wax buildup, runny nose, nasal congestion, bloody nose, or sore throat. Respiratory: Denies difficulty breathing, shortness of breath, cough or sputum production.   Cardiovascular: Denies chest pain, chest tightness, palpitations or swelling in the hands or feet.  Gastrointestinal: Denies abdominal pain, bloating, constipation, diarrhea or blood in the stool.  GU: Denies urgency, frequency, pain with urination, burning sensation, blood in urine, odor or discharge. Musculoskeletal: Denies decrease in range of motion, difficulty with gait, muscle pain or joint pain and swelling.  Skin: Denies redness, rashes, lesions or ulcercations.  Neurological: Denies dizziness, difficulty with memory, difficulty with speech or problems with balance and coordination.  Psych: Denies anxiety, depression, SI/HI.  No other specific complaints in a complete review of systems (except as  listed in HPI above).     Objective:   Physical Exam   BP 104/68   Pulse 80   Temp 98 F (36.7 C) (Oral)   Ht 5\' 7"  (1.702 m)   Wt 139 lb (63 kg)   SpO2 99%   BMI 21.77 kg/m  Wt Readings from Last 3 Encounters:  03/12/18 139 lb (63 kg)  02/03/17 154 lb (69.9 kg)  01/11/16 146 lb 12 oz (66.6 kg)    General: Appears her stated age, well developed, well nourished in NAD. Skin: Warm, dry and intact.  HEENT: Head: normal shape and size; Eyes: sclera white, no icterus, conjunctiva pink, PERRLA and EOMs intact; Ears: Tm's  gray and intact, normal light reflex;Throat/Mouth: Teeth present, mucosa pink and moist, no exudate, lesions or ulcerations noted.  Neck:  Neck supple, trachea midline. No masses, lumps or thyromegaly present.  Cardiovascular: Normal rate and rhythm. S1,S2 noted.  No murmur, rubs or gallops noted. No JVD or BLE edema. No carotid bruits noted. Pulmonary/Chest: Normal effort and positive vesicular breath sounds. No respiratory distress. No wheezes, rales or ronchi noted.  Abdomen: Soft and nontender. Normal bowel sounds. No distention or masses noted. Liver, spleen and kidneys non palpable. Musculoskeletal: Strength 5/5 BUE/BLE. No difficulty with gait.  Neurological: Alert and oriented. Cranial nerves II-XII grossly intact. Coordination normal.  Psychiatric: Mood and affect normal. Behavior is normal. Judgment and thought content normal.    BMET    Component Value Date/Time   NA 140 02/03/2017 1650   K 3.7 02/03/2017 1650   CL 102 02/03/2017 1650   CO2 30 02/03/2017 1650   GLUCOSE 88 02/03/2017 1650   BUN 15 02/03/2017 1650   CREATININE 1.09 02/03/2017 1650   CALCIUM 9.4 02/03/2017 1650    Lipid Panel     Component Value Date/Time   CHOL 192 02/03/2017 1650   TRIG 163 (H) 02/03/2017 1650   HDL 56 02/03/2017 1650   CHOLHDL 3.4 02/03/2017 1650   VLDL 33 (H) 12/11/2015 1446   LDLCALC 107 (H) 02/03/2017 1650    CBC    Component Value Date/Time   WBC  4.1 02/03/2017 1650   RBC 3.78 (L) 02/03/2017 1650   HGB 11.0 (L) 02/03/2017 1650   HCT 32.7 (L) 02/03/2017 1650   PLT 263 02/03/2017 1650   MCV 86.5 02/03/2017 1650   MCH 29.1 02/03/2017 1650   MCHC 33.6 02/03/2017 1650   RDW 13.1 02/03/2017 1650    Hgb A1C No results found for: HGBA1C         Assessment & Plan:   Preventative Health Maintenance:  Flu shot UTD Tetanus UTD per her request Pap smear UTD Mammogram UTD Referral placed to GI for screening colonoscopy Encouraged her to consume a balanced diet and exercise regimen Advised her to see an eye doctor and dentist annually Will check CBC, CMET, Lipid and Vit D today  RTC in 1 year, sooner if needed Cheryl Reaperegina Makyah Lavigne, Cheryl Williams

## 2018-03-12 NOTE — Assessment & Plan Note (Signed)
Continue Belsomra Will monitor 

## 2018-03-12 NOTE — Assessment & Plan Note (Signed)
Not medicated Encouraged her to keep hands warm with gloves/pocket warmers Will monitor

## 2018-03-12 NOTE — Assessment & Plan Note (Signed)
Controlled with prn Pantoprazole and Tums CBC and CMET today Encouraged her to avoid foods that trigger her reflux Will monitor

## 2018-03-13 ENCOUNTER — Other Ambulatory Visit: Payer: Self-pay | Admitting: Internal Medicine

## 2018-03-13 DIAGNOSIS — E559 Vitamin D deficiency, unspecified: Secondary | ICD-10-CM

## 2018-03-13 MED ORDER — VITAMIN D (ERGOCALCIFEROL) 1.25 MG (50000 UNIT) PO CAPS: 50000.0000 [IU] | ORAL_CAPSULE | ORAL | 0 refills | Status: DC

## 2018-03-13 MED FILL — VIT D2 1.25 MG (50,000 UNIT: 1.25 MG | 84 days supply | Qty: 12 | Fill #0

## 2018-03-14 ENCOUNTER — Encounter: Payer: Self-pay | Admitting: Internal Medicine

## 2018-03-20 ENCOUNTER — Encounter: Payer: Self-pay | Admitting: Internal Medicine

## 2018-03-21 ENCOUNTER — Other Ambulatory Visit: Payer: Self-pay | Admitting: Internal Medicine

## 2018-03-21 DIAGNOSIS — Z1231 Encounter for screening mammogram for malignant neoplasm of breast: Secondary | ICD-10-CM

## 2018-03-22 MED FILL — HYDROXYCHLOROQUINE SULFATE: 200 | 90 days supply | Qty: 90 | Fill #0

## 2018-03-23 MED FILL — VIT D2 1.25 MG (50,000 UNIT: 1.25 MG | 84 days supply | Qty: 12 | Fill #0

## 2018-04-09 ENCOUNTER — Ambulatory Visit (INDEPENDENT_AMBULATORY_CARE_PROVIDER_SITE_OTHER): Payer: Self-pay | Admitting: Podiatry

## 2018-04-09 ENCOUNTER — Encounter: Payer: Self-pay | Admitting: Podiatry

## 2018-04-09 ENCOUNTER — Ambulatory Visit (INDEPENDENT_AMBULATORY_CARE_PROVIDER_SITE_OTHER): Payer: No Typology Code available for payment source

## 2018-04-09 DIAGNOSIS — M2011 Hallux valgus (acquired), right foot: Secondary | ICD-10-CM

## 2018-04-10 NOTE — Progress Notes (Signed)
Subjective:   Patient ID: Cheryl Williams, female   DOB: 52 y.o.   MRN: [id number]   HPI Patient states overall doing well with mild swelling if I do too much but overall and healing well   ROS      Objective:  Physical Exam  Neurovascular status intact negative Homans sign noted wound edges well coapted range of motion good of the first MPJ with no crepitus of the joint with good alignment     Assessment:  Doing well post osteotomy with good alignment noted     Plan:  Advised on continued elevation compression gradual increase in activities and patient will be seen back to recheck in about 8 weeks or earlier if needed and will increase activity until that time  X-rays indicate the osteotomy is healing well with good alignment fixation in place no indication of movement

## 2018-04-23 ENCOUNTER — Ambulatory Visit (AMBULATORY_SURGERY_CENTER): Payer: Self-pay | Admitting: *Deleted

## 2018-04-23 ENCOUNTER — Encounter: Payer: Self-pay | Admitting: Internal Medicine

## 2018-04-23 VITALS — Ht 67.5 in | Wt 139.0 lb

## 2018-04-23 DIAGNOSIS — Z1211 Encounter for screening for malignant neoplasm of colon: Secondary | ICD-10-CM

## 2018-04-23 MED ORDER — NA SULFATE-K SULFATE-MG SULF 17.5-3.13-1.6 GM/177ML PO SOLN: 1.0000 | Freq: Once | ORAL | 0 refills | Status: AC

## 2018-04-23 MED FILL — SUPREP BOWEL PREP KIT: 17.5-3.13-1 | 1 days supply | Qty: 354 | Fill #0

## 2018-04-23 NOTE — Progress Notes (Signed)
No egg or soy allergy known to patient  No issues with past sedation with any surgeries  or procedures, no intubation problems  No diet pills per patient No home 02 use per patient  No blood thinners per patient  Pt denies issues with constipation  No A fib or A flutter  EMMI video sent to pt's e mail - pt dclined  Nov 2019 WBC count 2.8- pt unsure if from her Lupus or meds that she takes

## 2018-04-30 ENCOUNTER — Other Ambulatory Visit: Payer: Self-pay | Admitting: Internal Medicine

## 2018-04-30 MED ORDER — SUVOREXANT 10 MG PO TABS: 1.0000 | ORAL_TABLET | Freq: Every day | ORAL | 0 refills | Status: DC

## 2018-04-30 MED FILL — BELSOMRA 10 MG TABLET: 10 | 30 days supply | Qty: 30 | Fill #0

## 2018-04-30 NOTE — Telephone Encounter (Signed)
Last filled 02/19/2018...Marland Kitchen. please advise

## 2018-05-01 ENCOUNTER — Encounter: Payer: No Typology Code available for payment source | Admitting: Internal Medicine

## 2018-05-03 ENCOUNTER — Encounter: Payer: Self-pay | Admitting: Internal Medicine

## 2018-05-03 ENCOUNTER — Ambulatory Visit (AMBULATORY_SURGERY_CENTER): Payer: No Typology Code available for payment source | Admitting: Internal Medicine

## 2018-05-03 VITALS — BP 128/77 | HR 77 | Temp 98.0°F | Resp 13 | Ht 67.0 in | Wt 139.0 lb

## 2018-05-03 DIAGNOSIS — Z1211 Encounter for screening for malignant neoplasm of colon: Secondary | ICD-10-CM | POA: Diagnosis present

## 2018-05-03 MED ORDER — SODIUM CHLORIDE 0.9 % IV SOLN: 500.0000 mL | Freq: Once | INTRAVENOUS | Status: DC

## 2018-05-03 NOTE — Op Note (Signed)
[facility] Patient Name: Cheryl Williams Procedure Date: 05/03/2018 2:59 PM MRN: [id number] Endoscopist: Iva Boop , MD Age: 53 Referring MD:  Date of Birth: [birth year] Gender: Female Account #: [id number] Procedure:                Colonoscopy Indications:              Screening for colorectal malignant neoplasm, This                            is the patient's first colonoscopy Medicines:                Propofol per Anesthesia, Monitored Anesthesia Care Procedure:                Pre-Anesthesia Assessment:                           - Prior to the procedure, a History and Physical                            was performed, and patient medications and                            allergies were reviewed. The patient's tolerance of                            previous anesthesia was also reviewed. The risks                            and benefits of the procedure and the sedation                            options and risks were discussed with the patient.                            All questions were answered, and informed consent                            was obtained. Prior Anticoagulants: The patient has                            taken no previous anticoagulant or antiplatelet                            agents. ASA Grade Assessment: III - A patient with                            severe systemic disease. After reviewing the risks                            and benefits, the patient was deemed in                            satisfactory condition to undergo the procedure.  After obtaining informed consent, the colonoscope                            was passed under direct vision. Throughout the                            procedure, the patient's blood pressure, pulse, and                            oxygen saturations were monitored continuously. The                            Colonoscope was introduced through the anus and          advanced to the the cecum, identified by                            appendiceal orifice and ileocecal valve. The                            colonoscopy was performed without difficulty. The                            patient tolerated the procedure well. The quality                            of the bowel preparation was excellent. The bowel                            preparation used was SUPREP. The ileocecal valve,                            appendiceal orifice, and rectum were photographed. Scope In: 3:07:13 PM Scope Out: 3:20:37 PM Scope Withdrawal Time: 0 hours 8 minutes 57 seconds  Total Procedure Duration: 0 hours 13 minutes 24 seconds  Findings:                 The perianal and digital rectal examinations were                            normal.                           The entire examined colon appeared normal on direct                            and retroflexion views. Complications:            No immediate complications. Estimated Blood Loss:     Estimated blood loss: none. Impression:               - The entire examined colon is normal on direct and                            retroflexion views.                           -  No specimens collected. Recommendation:           - Patient has a contact number available for                            emergencies. The signs and symptoms of potential                            delayed complications were discussed with the                            patient. Return to normal activities tomorrow.                            Written discharge instructions were provided to the                            patient.                           - Resume previous diet.                           - Continue present medications.                           - Repeat colonoscopy in 10 years for screening                            purposes. Iva Booparl E Gessner, MD 05/03/2018 3:25:45 PM This report has been signed electronically.

## 2018-05-03 NOTE — Progress Notes (Signed)
Report given to PACU, vss 

## 2018-05-03 NOTE — Patient Instructions (Addendum)
   The colonoscopy was normal. Next routine colonoscopy or other screening test in 10 years - 2030.  I appreciate the opportunity to care for you. Iva Boop, MD, FACG  YOU HAD AN ENDOSCOPIC PROCEDURE TODAY AT THE [facility]:   Refer to the procedure report that was given to you for any specific questions about what was found during the examination.  If the procedure report does not answer your questions, please call your gastroenterologist to clarify.  If you requested that your care partner not be given the details of your procedure findings, then the procedure report has been included in a sealed envelope for you to review at your convenience later.  YOU SHOULD EXPECT: Some feelings of bloating in the abdomen. Passage of more gas than usual.  Walking can help get rid of the air that was put into your GI tract during the procedure and reduce the bloating. If you had a lower endoscopy (such as a colonoscopy or flexible sigmoidoscopy) you may notice spotting of blood in your stool or on the toilet paper. If you underwent a bowel prep for your procedure, you may not have a normal bowel movement for a few days.  Please Note:  You might notice some irritation and congestion in your nose or some drainage.  This is from the oxygen used during your procedure.  There is no need for concern and it should clear up in a day or so.  SYMPTOMS TO REPORT IMMEDIATELY:   Following lower endoscopy (colonoscopy or flexible sigmoidoscopy):  Excessive amounts of blood in the stool  Significant tenderness or worsening of abdominal pains  Swelling of the abdomen that is new, acute  Fever of 100F or higher   For urgent or emergent issues, a gastroenterologist can be reached at any hour by calling [phone].   DIET:  We do recommend a small meal at first, but then you may proceed to your regular diet.  Drink plenty of fluids but you should avoid alcoholic beverages for 24  hours.  ACTIVITY:  You should plan to take it easy for the rest of today and you should NOT DRIVE or use heavy machinery until tomorrow (because of the sedation medicines used during the test).    FOLLOW UP: Our staff will call the number listed on your records the next business day following your procedure to check on you and address any questions or concerns that you may have regarding the information given to you following your procedure. If we do not reach you, we will leave a message.  However, if you are feeling well and you are not experiencing any problems, there is no need to return our call.  We will assume that you have returned to your regular daily activities without incident.  If any biopsies were taken you will be contacted by phone or by letter within the next 1-3 weeks.  Please call us at [phone] if you have not heard about the biopsies in 3 weeks.    SIGNATURES/CONFIDENTIALITY: You and/or your care partner have signed paperwork which will be entered into your electronic medical record.  These signatures attest to the fact that that the information above on your After Visit Summary has been reviewed and is understood.  Full responsibility of the confidentiality of this discharge information lies with you and/or your care-partner.  Read all of the handouts given to you by your recovery room nurse.  Recall will be 10 years.

## 2018-05-03 NOTE — Progress Notes (Signed)
Pt's states no medical or surgical changes since previsit or office visit. 

## 2018-05-04 ENCOUNTER — Telehealth: Payer: Self-pay | Admitting: *Deleted

## 2018-05-04 NOTE — Telephone Encounter (Signed)
  Follow up Call-  Call back number 05/03/2018  Post procedure Call Back phone  # [phone]  Permission to leave phone message Yes  Some recent data might be hidden     Patient questions:  Do you have a fever, pain , or abdominal swelling? No. Pain Score  0 *  Have you tolerated food without any problems? Yes.    Have you been able to return to your normal activities? Yes.    Do you have any questions about your discharge instructions: Diet   No. Medications  No. Follow up visit  No.  Do you have questions or concerns about your Care? No.  Actions: * If pain score is 4 or above: No action needed, pain <4.

## 2018-05-07 ENCOUNTER — Ambulatory Visit
Admission: RE | Admit: 2018-05-07 | Discharge: 2018-05-07 | Disposition: A | Discharge status: Home / Self Care | Payer: No Typology Code available for payment source | Source: Ambulatory Visit | Attending: Internal Medicine | Admitting: Internal Medicine

## 2018-05-07 DIAGNOSIS — Z1231 Encounter for screening mammogram for malignant neoplasm of breast: Secondary | ICD-10-CM

## 2018-05-10 ENCOUNTER — Other Ambulatory Visit: Payer: Self-pay | Admitting: Internal Medicine

## 2018-05-10 MED FILL — PROPRANOLOL HCL 10 MG TAB: 10 | 30 days supply | Qty: 30 | Fill #0

## 2018-05-10 NOTE — Telephone Encounter (Signed)
Last filled 02/2016 w/ 1 refill... please advise

## 2018-05-16 ENCOUNTER — Telehealth: Payer: Self-pay | Admitting: Internal Medicine

## 2018-05-16 DIAGNOSIS — M329 Systemic lupus erythematosus, unspecified: Secondary | ICD-10-CM

## 2018-05-16 NOTE — Telephone Encounter (Signed)
[facility] and pt is scheduled today with Dr.Groat for her medical eye exam due to having lupus. Pt was told to make sure she lets her PCP know so it can be noted.

## 2018-05-16 NOTE — Addendum Note (Signed)
Addended by: Lorre Munroe on: 05/16/2018 12:17 PM   Modules accepted: Orders

## 2018-06-11 ENCOUNTER — Other Ambulatory Visit: Payer: No Typology Code available for payment source

## 2018-06-15 ENCOUNTER — Ambulatory Visit (INDEPENDENT_AMBULATORY_CARE_PROVIDER_SITE_OTHER): Payer: No Typology Code available for payment source

## 2018-06-15 ENCOUNTER — Ambulatory Visit: Payer: No Typology Code available for payment source | Admitting: Podiatry

## 2018-06-15 DIAGNOSIS — M2011 Hallux valgus (acquired), right foot: Secondary | ICD-10-CM

## 2018-06-15 DIAGNOSIS — M779 Enthesopathy, unspecified: Secondary | ICD-10-CM

## 2018-06-16 NOTE — Progress Notes (Signed)
Subjective:   Patient ID: Cheryl Williams, female   DOB: 53 y.o.   MRN: [id number]   HPI Patient states overall doing really well but does get some numbness and tingling   ROS      Objective:  Physical Exam  Neurovascular status intact with patient's right first MPJ healing well wound edges well coapted and good range of motion with no crepitus     Assessment:  Patient is still in recovery but overall continues to improve     Plan:  Reviewed x-rays and recommended the continuation of stretching exercises and supportive shoes.  Patient will be seen back as needed is discharged from the procedure at this time  X-ray indicates the osteotomy is healed well with good alignment noted joint congruence

## 2018-06-27 ENCOUNTER — Other Ambulatory Visit: Payer: Self-pay | Admitting: Internal Medicine

## 2018-06-27 MED ORDER — SUVOREXANT 10 MG PO TABS: 1.0000 | ORAL_TABLET | Freq: Every day | ORAL | 0 refills | Status: DC

## 2018-06-27 MED FILL — BELSOMRA 10 MG TABLET: 10 | 30 days supply | Qty: 30 | Fill #0

## 2018-06-27 NOTE — Telephone Encounter (Signed)
Last filled 04/30/2018... please advise 

## 2018-07-05 ENCOUNTER — Other Ambulatory Visit: Payer: No Typology Code available for payment source | Admitting: Orthotics

## 2018-07-12 MED FILL — HYDROXYCHLOROQUINE 200 MG: 200 | 90 days supply | Qty: 90 | Fill #0

## 2018-08-09 ENCOUNTER — Ambulatory Visit (INDEPENDENT_AMBULATORY_CARE_PROVIDER_SITE_OTHER): Payer: No Typology Code available for payment source | Admitting: Internal Medicine

## 2018-08-09 ENCOUNTER — Encounter: Payer: Self-pay | Admitting: Internal Medicine

## 2018-08-09 VITALS — Wt 149.0 lb

## 2018-08-09 DIAGNOSIS — R4586 Emotional lability: Secondary | ICD-10-CM

## 2018-08-09 DIAGNOSIS — R232 Flushing: Secondary | ICD-10-CM

## 2018-08-09 DIAGNOSIS — N951 Menopausal and female climacteric states: Secondary | ICD-10-CM

## 2018-08-09 DIAGNOSIS — F5104 Psychophysiologic insomnia: Secondary | ICD-10-CM

## 2018-08-09 MED ORDER — PAROXETINE HCL 20 MG PO TABS: ORAL_TABLET | ORAL | 2 refills | Status: DC

## 2018-08-09 NOTE — Patient Instructions (Signed)
Menopause  Menopause is the normal time of life when menstrual periods stop completely. It is usually confirmed by 12 months without a menstrual period. The transition to menopause (perimenopause) most often happens between the ages of 45 and 55. During perimenopause, hormone levels change in your body, which can cause symptoms and affect your health. Menopause may increase your risk for:   Loss of bone (osteoporosis), which causes bone breaks (fractures).   Depression.   Hardening and narrowing of the arteries (atherosclerosis), which can cause heart attacks and strokes.  What are the causes?  This condition is usually caused by a natural change in hormone levels that happens as you get older. The condition may also be caused by surgery to remove both ovaries (bilateral oophorectomy).  What increases the risk?  This condition is more likely to start at an earlier age if you have certain medical conditions or treatments, including:   A tumor of the pituitary gland in the brain.   A disease that affects the ovaries and hormone production.   Radiation treatment for cancer.   Certain cancer treatments, such as chemotherapy or hormone (anti-estrogen) therapy.   Heavy smoking and excessive alcohol use.   Family history of early menopause.  This condition is also more likely to develop earlier in women who are very thin.  What are the signs or symptoms?  Symptoms of this condition include:   Hot flashes.   Irregular menstrual periods.   Night sweats.   Changes in feelings about sex. This could be a decrease in sex drive or an increased comfort around your sexuality.   Vaginal dryness and thinning of the vaginal walls. This may cause painful intercourse.   Dryness of the skin and development of wrinkles.   Headaches.   Problems sleeping (insomnia).   Mood swings or irritability.   Memory problems.   Weight gain.   Hair growth on the face and chest.   Bladder infections or problems with urinating.  How  is this diagnosed?  This condition is diagnosed based on your medical history, a physical exam, your age, your menstrual history, and your symptoms. Hormone tests may also be done.  How is this treated?  In some cases, no treatment is needed. You and your health care provider should make a decision together about whether treatment is necessary. Treatment will be based on your individual condition and preferences. Treatment for this condition focuses on managing symptoms. Treatment may include:   Menopausal hormone therapy (MHT).   Medicines to treat specific symptoms or complications.   Acupuncture.   Vitamin or herbal supplements.  Before starting treatment, make sure to let your health care provider know if you have a personal or family history of:   Heart disease.   Breast cancer.   Blood clots.   Diabetes.   Osteoporosis.  Follow these instructions at home:  Lifestyle   Do not use any products that contain nicotine or tobacco, such as cigarettes and e-cigarettes. If you need help quitting, ask your health care provider.   Get at least 30 minutes of physical activity on 5 or more days each week.   Avoid alcoholic and caffeinated beverages, as well as spicy foods. This may help prevent hot flashes.   Get 7-8 hours of sleep each night.   If you have hot flashes, try:  ? Dressing in layers.  ? Avoiding things that may trigger hot flashes, such as spicy food, warm places, or stress.  ? Taking slow, deep   breaths when a hot flash starts.  ? Keeping a fan in your home and office.   Find ways to manage stress, such as deep breathing, meditation, or journaling.   Consider going to group therapy with other women who are having menopause symptoms. Ask your health care provider about recommended group therapy meetings.  Eating and drinking   Eat a healthy, balanced diet that contains whole grains, lean protein, low-fat dairy, and plenty of fruits and vegetables.   Your health care provider may recommend  adding more soy to your diet. Foods that contain soy include tofu, tempeh, and soy milk.   Eat plenty of foods that contain calcium and vitamin D for bone health. Items that are rich in calcium include low-fat milk, yogurt, beans, almonds, sardines, broccoli, and kale.  Medicines   Take over-the-counter and prescription medicines only as told by your health care provider.   Talk with your health care provider before starting any herbal supplements. If prescribed, take vitamins and supplements as told by your health care provider. These may include:  ? Calcium. Women age 51 and older should get 1,200 mg (milligrams) of calcium every day.  ? Vitamin D. Women need 600-800 International Units of vitamin D each day.  ? Vitamins B12 and B6. Aim for 50 micrograms of B12 and 1.5 mg of B6 each day.  General instructions   Keep track of your menstrual periods, including:  ? When they occur.  ? How heavy they are and how long they last.  ? How much time passes between periods.   Keep track of your symptoms, noting when they start, how often you have them, and how long they last.   Use vaginal lubricants or moisturizers to help with vaginal dryness and improve comfort during sex.   Keep all follow-up visits as told by your health care provider. This is important. This includes any group therapy or counseling.  Contact a health care provider if:   You are still having menstrual periods after age 55.   You have pain during sex.   You have not had a period for 12 months and you develop vaginal bleeding.  Get help right away if:   You have:  ? Severe depression.  ? Excessive vaginal bleeding.  ? Pain when you urinate.  ? A fast or irregular heart beat (palpitations).  ? Severe headaches.  ? Abdomen (abdominal) pain or severe indigestion.   You fell and you think you have a broken bone.   You develop leg or chest pain.   You develop vision problems.   You feel a lump in your breast.  Summary   Menopause is the normal  time of life when menstrual periods stop completely. It is usually confirmed by 12 months without a menstrual period.   The transition to menopause (perimenopause) most often happens between the ages of 45 and 55.   Symptoms can be managed through medicines, lifestyle changes, and complementary therapies such as acupuncture.   Eat a balanced diet that is rich in nutrients to promote bone health and heart health and to manage symptoms during menopause.  This information is not intended to replace advice given to you by your health care provider. Make sure you discuss any questions you have with your health care provider.  Document Released: 06/25/2003 Document Revised: 05/07/2016 Document Reviewed: 05/07/2016  Elsevier Interactive Patient Education  2019 [employer]

## 2018-08-09 NOTE — Progress Notes (Signed)
Virtual Visit via Video Note  I connected with Cheryl Williams on 08/09/18 at  3:00 PM EDT by a video enabled telemedicine application and verified that I am speaking with the correct person using two identifiers.   I discussed the limitations of evaluation and management by telemedicine and the availability of in person appointments. The patient expressed understanding and agreed to proceed.  Patient Location: Provider Location: Office  History of Present Illness:  Pt reports fatigue, difficulty sleeping and hot flashes. She noticed this 4-6 weeks ago. She is tired because she is not sleeping well. She is having trouble falling asleep and staying asleep. She normally has issues with sleep but is able to take Belsomra, but she reports as of late, the medication does not seem as effective.  Her LMP was 06/2018, but she did not have one for 4 months prior to that. She reports the hot flashes are waking her up in the middle of the night. She is having mood swings. She is no longer using the NuvaRing. She is perimenopausal.    Past Medical History:  Diagnosis Date  . Anemia   . Anxiety    claustrphobia  . Chicken pox   . GERD (gastroesophageal reflux disease)   . IBS (irritable bowel syndrome)   . Lupus (HCC)   . Raynaud disease   . Sjogrens syndrome (HCC)     Current Outpatient Medications  Medication Sig Dispense Refill  . ALPRAZolam (XANAX) 0.5 MG tablet Take 1 tablet (0.5 mg total) by mouth daily as needed for anxiety. 20 tablet 0  . Biotin 5000 MCG CAPS Take by mouth daily.    . hydroxychloroquine (PLAQUENIL) 200 MG tablet Take by mouth daily.    Marland Kitchen ibuprofen (ADVIL,MOTRIN) 600 MG tablet TAKE 1 TABLET BY MOUTH 3 TIMES DAILY. 60 tablet 5  . NUVARING 0.12-0.015 MG/24HR vaginal ring PLACE 1 RING VAGINALLY EVERY 28 DAYS. 3 each 3  . ondansetron (ZOFRAN) 4 MG tablet Take 4 mg by mouth every 8 (eight) hours as needed. for nausea  0  . propranolol (INDERAL) 10 MG tablet TAKE 1 TABLET  BY MOUTH DAILY AS NEEDED. 30 tablet 1  . Suvorexant (BELSOMRA) 10 MG TABS Take 1 tablet by mouth at bedtime. 30 tablet 0  . valACYclovir (VALTREX) 1000 MG tablet Take 0.5 tablets (500 mg total) by mouth 2 (two) times daily. Times 3 days for recurrent outbreaks and 1 tab daily for suppressive therapy. 30 tablet 0  . Vitamin D, Ergocalciferol, (DRISDOL) 1.25 MG (50000 UT) CAPS capsule Take 1 capsule (50,000 Units total) by mouth every 7 (seven) days. 12 capsule 0   No current facility-administered medications for this visit.     Allergies  Allergen Reactions  . Penicillins      Rash and hives at age 30  . Imuran [Azathioprine] Other (See Comments)    Mental hallucinations    Family History  Problem Relation Age of Onset  . Hyperlipidemia Mother   . Hypertension Mother   . Deafness Mother   . Colon polyps Mother   . Alcohol abuse Father   . Deafness Father   . Drug abuse Sister   . Hypertension Sister   . Drug abuse Brother   . Rheum arthritis Maternal Aunt   . Lupus Maternal Aunt   . Rheum arthritis Maternal Grandmother   . Hypertension Sister   . Colon cancer Neg Hx   . Esophageal cancer Neg Hx   . Rectal cancer Neg Hx   .  Stomach cancer Neg Hx     Social History   Socioeconomic History  . Marital status: Divorced    Spouse name: Not on file  . Number of children: Not on file  . Years of education: Not on file  . Highest education level: Not on file  Occupational History  . Not on file  Social Needs  . Financial resource strain: Not on file  . Food insecurity:    Worry: Not on file    Inability: Not on file  . Transportation needs:    Medical: Not on file    Non-medical: Not on file  Tobacco Use  . Smoking status: Never Smoker  . Smokeless tobacco: Never Used  Substance and Sexual Activity  . Alcohol use: Yes    Alcohol/week: 0.0 standard drinks    Comment: occasional--moderate  . Drug use: No  . Sexual activity: Yes  Lifestyle  . Physical activity:     Days per week: Not on file    Minutes per session: Not on file  . Stress: Not on file  Relationships  . Social connections:    Talks on phone: Not on file    Gets together: Not on file    Attends religious service: Not on file    Active member of club or organization: Not on file    Attends meetings of clubs or organizations: Not on file    Relationship status: Not on file  . Intimate partner violence:    Fear of current or ex partner: Not on file    Emotionally abused: Not on file    Physically abused: Not on file    Forced sexual activity: Not on file  Other Topics Concern  . Not on file  Social History Narrative  . Not on file     Constitutional: Pt reports fatigue, hot flashes, night sweats. Denies fever, malaise, headache or abrupt weight changes.  HEENT: Denies eye pain, eye redness, ear pain, ringing in the ears, wax buildup, runny nose, nasal congestion, bloody nose, or sore throat. Respiratory: Denies difficulty breathing, shortness of breath, cough or sputum production.   Cardiovascular: Denies chest pain, chest tightness, palpitations or swelling in the hands or feet.  Skin: Denies redness, rashes, lesions or ulcercations.  Neurological: Denies dizziness, difficulty with memory, difficulty with speech or problems with balance and coordination.  Psych: Pt reports mood changes, anxiety. Denies depression, SI/HI.  No other specific complaints in a complete review of systems (except as listed in HPI above). Wt 149 lb (67.6 kg)   LMP 06/20/2018   BMI 23.34 kg/m   Wt Readings from Last 3 Encounters:  05/03/18 139 lb (63 kg)  04/23/18 139 lb (63 kg)  03/12/18 139 lb (63 kg)    General: Appears her stated age, well developed, well nourished in NAD. Skin: Warm, dry and intact.  Pulmonary/Chest: Normal effort. No respiratory distress. Neurological: Alert and oriented.  Psychiatric: Mood and affect normal. Behavior is normal. Judgment and thought content normal.      BMET    Component Value Date/Time   NA 139 03/12/2018 1446   K 3.6 03/12/2018 1446   CL 103 03/12/2018 1446   CO2 29 03/12/2018 1446   GLUCOSE 89 03/12/2018 1446   BUN 11 03/12/2018 1446   CREATININE 0.69 03/12/2018 1446   CREATININE 1.09 02/03/2017 1650   CALCIUM 9.5 03/12/2018 1446    Lipid Panel     Component Value Date/Time   CHOL 187 03/12/2018 1446  TRIG 82.0 03/12/2018 1446   HDL 59.10 03/12/2018 1446   CHOLHDL 3 03/12/2018 1446   VLDL 16.4 03/12/2018 1446   LDLCALC 112 (H) 03/12/2018 1446   LDLCALC 107 (H) 02/03/2017 1650    CBC    Component Value Date/Time   WBC 2.8 (L) 03/12/2018 1446   RBC 3.62 (L) 03/12/2018 1446   HGB 10.6 (L) 03/12/2018 1446   HCT 31.7 (L) 03/12/2018 1446   PLT 255.0 03/12/2018 1446   MCV 87.6 03/12/2018 1446   MCH 29.1 02/03/2017 1650   MCHC 33.4 03/12/2018 1446   RDW 13.3 03/12/2018 1446    Hgb A1C No results found for: HGBA1C       Assessment and Plan:  Perimenopause, Hot Flashes, Mood Swings, Insomnia:  Support offered today She is not interested in [employer] OTC She would like to trial Paxil- RX sent to pharmacy Continue Belsomra, Xanax prn  Make a follow up appt in 4-6 weeks  Follow Up Instructions:    I discussed the assessment and treatment plan with the patient. The patient was provided an opportunity to ask questions and all were answered. The patient agreed with the plan and demonstrated an understanding of the instructions.   The patient was advised to call back or seek an in-person evaluation if the symptoms worsen or if the condition fails to improve as anticipated.     Nicki Reaperegina Baity, NP

## 2018-08-30 ENCOUNTER — Other Ambulatory Visit: Payer: Self-pay | Admitting: Internal Medicine

## 2018-08-30 MED ORDER — SUVOREXANT 10 MG PO TABS: 1.0000 | ORAL_TABLET | Freq: Every day | ORAL | 0 refills | Status: DC

## 2018-08-30 NOTE — Telephone Encounter (Signed)
Last filled 06/27/2018... please advise 

## 2018-09-21 MED FILL — BELSOMRA 10 MG TABLET: 10 | 30 days supply | Qty: 30 | Fill #0

## 2018-10-15 MED FILL — BELSOMRA 10 MG TABLET: 10 | 30 days supply | Qty: 30 | Fill #0

## 2018-11-05 MED FILL — HYDROXYCHLOROQUINE SULFATE: 200 | 90 days supply | Qty: 90 | Fill #0

## 2018-11-15 ENCOUNTER — Ambulatory Visit (INDEPENDENT_AMBULATORY_CARE_PROVIDER_SITE_OTHER): Payer: No Typology Code available for payment source | Admitting: Podiatry

## 2018-11-15 ENCOUNTER — Encounter: Payer: Self-pay | Admitting: Podiatry

## 2018-11-15 ENCOUNTER — Ambulatory Visit (INDEPENDENT_AMBULATORY_CARE_PROVIDER_SITE_OTHER): Payer: No Typology Code available for payment source

## 2018-11-15 ENCOUNTER — Other Ambulatory Visit: Payer: Self-pay

## 2018-11-15 VITALS — Temp 97.8°F

## 2018-11-15 DIAGNOSIS — M2011 Hallux valgus (acquired), right foot: Secondary | ICD-10-CM

## 2018-11-15 DIAGNOSIS — M779 Enthesopathy, unspecified: Secondary | ICD-10-CM | POA: Diagnosis not present

## 2018-11-15 MED ORDER — MELOXICAM 15 MG PO TABS: 15.0000 mg | ORAL_TABLET | Freq: Every day | ORAL | 2 refills | Status: DC

## 2018-11-15 MED FILL — MELOXICAM 15 MG TABLET: 15 | 30 days supply | Qty: 30 | Fill #0

## 2018-11-15 NOTE — Progress Notes (Signed)
Subjective:   Patient ID: Cheryl Williams, female   DOB: 53 y.o.   MRN: [id number]   HPI Patient states she was just concerned about her surgical foot stating she feels like it is wider that at times her some discomfort and she feels some numbness around the joint.  Patient states that she cannot wear some of her old shoes yet   ROS      Objective:  Physical Exam  Neurovascular status was found to be intact with patient found to have a well-healed surgical site right first metatarsal with excellent range of motion no crepitus of the joint noted currently and good alignment noted     Assessment:  Difficult to make a determination as to whether or not this is simply just postoperative.  Or could be some kind of inflammatory condition that is unrelated to the previous surgery     Plan:  H&P reviewed all conditions and at this point will get a place on Mobic for anti-inflammatory activity along with supportive shoes.  If symptoms do persist over the next 3 to 4 months I want to see her back and I did advise her on compression therapy  X-rays indicate that there is good healing of the osteotomy with fixation in place no indication secondary bone healing with the angle reduced joint congruence

## 2018-12-06 ENCOUNTER — Other Ambulatory Visit: Payer: Self-pay | Admitting: Internal Medicine

## 2018-12-06 MED ORDER — BELSOMRA 10 MG PO TABS: 1.0000 | ORAL_TABLET | Freq: Every day | ORAL | 0 refills | Status: DC

## 2018-12-06 MED ORDER — ALPRAZOLAM 0.5 MG PO TABS: 0.5000 mg | ORAL_TABLET | Freq: Every day | ORAL | 0 refills | Status: DC | PRN

## 2018-12-06 MED FILL — ALPRAZolam 0.5 MG TABS: 0.5 | 20 days supply | Qty: 20 | Fill #0

## 2018-12-06 MED FILL — BELSOMRA 10 MG TABLET: 10 | 30 days supply | Qty: 30 | Fill #0

## 2018-12-06 NOTE — Telephone Encounter (Signed)
Xanax last filled 06/2017... Belsomra last filled 08/30/2018... please advise

## 2018-12-14 MED FILL — MELOXICAM 15 MG TABLET: 15 | 30 days supply | Qty: 30 | Fill #0

## 2019-01-30 ENCOUNTER — Other Ambulatory Visit: Payer: Self-pay | Admitting: Internal Medicine

## 2019-01-30 MED FILL — PROPRANOLOL HCL 10 MG TAB: 10 | 30 days supply | Qty: 30 | Fill #1

## 2019-01-31 NOTE — Telephone Encounter (Signed)
Last filled 08/20/2018... please advise

## 2019-02-01 MED ORDER — BELSOMRA 10 MG PO TABS: 1.0000 | ORAL_TABLET | Freq: Every day | ORAL | 0 refills | Status: DC

## 2019-02-01 MED FILL — BELSOMRA 10 MG TABLET: 10 | 30 days supply | Qty: 30 | Fill #0

## 2019-02-08 MED FILL — PROPRANOLOL HCL 10 MG TAB: 10 | 30 days supply | Qty: 30 | Fill #1

## 2019-03-13 ENCOUNTER — Other Ambulatory Visit: Payer: Self-pay | Admitting: Internal Medicine

## 2019-03-13 NOTE — Telephone Encounter (Signed)
Last filled 02/01/2019.. please advise 

## 2019-03-22 MED FILL — PROPRANOLOL HCL 10 MG TAB: 10 | 30 days supply | Qty: 30 | Fill #0

## 2019-03-25 ENCOUNTER — Other Ambulatory Visit: Payer: Self-pay | Admitting: Internal Medicine

## 2019-03-25 DIAGNOSIS — Z1231 Encounter for screening mammogram for malignant neoplasm of breast: Secondary | ICD-10-CM

## 2019-04-01 MED FILL — PROPRANOLOL HCL 10 MG TAB: 10 | 30 days supply | Qty: 30 | Fill #0

## 2019-04-29 ENCOUNTER — Encounter: Payer: No Typology Code available for payment source | Admitting: Internal Medicine

## 2019-04-30 ENCOUNTER — Other Ambulatory Visit: Payer: Self-pay

## 2019-04-30 ENCOUNTER — Encounter: Payer: Self-pay | Admitting: Internal Medicine

## 2019-04-30 ENCOUNTER — Ambulatory Visit (INDEPENDENT_AMBULATORY_CARE_PROVIDER_SITE_OTHER): Payer: No Typology Code available for payment source | Admitting: Internal Medicine

## 2019-04-30 VITALS — BP 118/70 | HR 74 | Temp 98.0°F | Ht 67.0 in | Wt 150.0 lb

## 2019-04-30 DIAGNOSIS — I7300 Raynaud's syndrome without gangrene: Secondary | ICD-10-CM

## 2019-04-30 DIAGNOSIS — Z0000 Encounter for general adult medical examination without abnormal findings: Secondary | ICD-10-CM

## 2019-04-30 DIAGNOSIS — F411 Generalized anxiety disorder: Secondary | ICD-10-CM

## 2019-04-30 DIAGNOSIS — B009 Herpesviral infection, unspecified: Secondary | ICD-10-CM

## 2019-04-30 DIAGNOSIS — M329 Systemic lupus erythematosus, unspecified: Secondary | ICD-10-CM

## 2019-04-30 DIAGNOSIS — K219 Gastro-esophageal reflux disease without esophagitis: Secondary | ICD-10-CM

## 2019-04-30 DIAGNOSIS — M3500 Sicca syndrome, unspecified: Secondary | ICD-10-CM

## 2019-04-30 DIAGNOSIS — F5104 Psychophysiologic insomnia: Secondary | ICD-10-CM | POA: Diagnosis not present

## 2019-04-30 DIAGNOSIS — A609 Anogenital herpesviral infection, unspecified: Secondary | ICD-10-CM

## 2019-04-30 MED ORDER — VALACYCLOVIR HCL 1 G PO TABS: 500.0000 mg | ORAL_TABLET | Freq: Two times a day (BID) | ORAL | 0 refills | Status: DC

## 2019-04-30 MED FILL — valACYclovir HCL 1 GM TABS: 1 | 30 days supply | Qty: 30 | Fill #0

## 2019-04-30 NOTE — Assessment & Plan Note (Signed)
Continue Propanolol/Xanax Will monitor

## 2019-04-30 NOTE — Assessment & Plan Note (Signed)
Continue Plaquenil Continue to follow with rheumatology  

## 2019-04-30 NOTE — Assessment & Plan Note (Signed)
Continue Belsomra Will monitor

## 2019-04-30 NOTE — Assessment & Plan Note (Signed)
Continue Valtrex as needed

## 2019-04-30 NOTE — Assessment & Plan Note (Signed)
Continue Plaquenil Continue to follow with rheumatology

## 2019-04-30 NOTE — Progress Notes (Signed)
Subjective:    Patient ID: Cheryl Williams, female    DOB: [birth year], 54 y.o.   MRN: [id number]  HPI  Pt presents to the clinic today for her annual exam. She is also due to follow up chronic conditions.  Lupus/Sjogrens: Managed on Plaquenil. She follows with rheumatology.  Anxiety: Mainly with flying and public speaking. She takes Propanolol and/or Xanax as needed for these occasions. She does not follow with a therapist. She denies depression. She denies SI/HI.  GERD: Triggered by acidic foods. She takesTums as needed with good relief of symptoms. There is no upper GI on file.   Insomnia: She has trouble falling asleep, secondary to anxiety. She takes Belsomra as needed with good results. There is no sleep study on file.   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 She takes Valtrex as needed with good relief of symptoms.   Raynaud's: Worse in the summer when the [facility] is running. She is not taking any medications at this time.   Flu: 01/2019 Tetanus: < 5 years ago  Pap Smear: 01/2017 Mammogram: 04/2018, scheduled 05/15/19 Colon Screening: 04/2018, 10 years Vision Screening: biannually Dentist: biannually  Diet: She does eat meat. She consumes fruits and veggies daily. She occasionally eats fried foods. She drinks mostly water, seltzer water, juice Exercise: Biking for 1 hour 30 min at least 2 days per week  Review of Systems  Past Medical History:  Diagnosis Date  . Anemia   . Anxiety    claustrphobia  . Chicken pox   . GERD (gastroesophageal reflux disease)   . IBS (irritable bowel syndrome)   . Lupus (HCC)   . Raynaud disease   . Sjogrens syndrome (H[facility])     Current Outpatient Medications  Medication Sig Dispense Refill  . ALPRAZolam (XANAX) 0.5 MG tablet Take 1 tablet (0.5 mg total) by mouth daily as needed for anxiety. 20 tablet 0  . BELSOMRA 10 MG TABS TAKE 1 TABLET BY MOUTH AT BEDTIME. 30 tablet 0  . BELSOMRA 10 MG TABS TAKE 1/2 TABLET TO 1 TABLET BY MOUTH DAILY  AT BEDTIME AS NEEDED 30 tablet 0  . Biotin 5000 MCG CAPS Take by mouth daily.    . hydroxychloroquine (PLAQUENIL) 200 MG tablet Take by mouth daily.    Marland Kitchen ibuprofen (ADVIL,MOTRIN) 600 MG tablet TAKE 1 TABLET BY MOUTH 3 TIMES DAILY. 60 tablet 5  . meloxicam (MOBIC) 15 MG tablet Take 1 tablet (15 mg total) by mouth daily. 30 tablet 2  . ondansetron (ZOFRAN) 4 MG tablet Take 4 mg by mouth every 8 (eight) hours as needed. for nausea  0  . PARoxetine (PAXIL) 20 MG tablet Take 0.5 tablets (10 mg total) by mouth daily for 10 days, THEN 1 tablet (20 mg total) daily. 30 tablet 2  . propranolol (INDERAL) 10 MG tablet TAKE 1 TABLET BY MOUTH DAILY AS NEEDED. 30 tablet 1  . Suvorexant (BELSOMRA) 10 MG TABS Take 1 tablet by mouth at bedtime. MUST SCHEDULE PHYSICAL 30 tablet 0  . valACYclovir (VALTREX) 1000 MG tablet Take 0.5 tablets (500 mg total) by mouth 2 (two) times daily. Times 3 days for recurrent outbreaks and 1 tab daily for suppressive therapy. 30 tablet 0   No current facility-administered medications for this visit.    Allergies  Allergen Reactions  . Penicillins      Rash and hives at age 66  . Imuran [Azathioprine] Other (See Comments)    Mental hallucinations    Family History  Problem  Relation Age of Onset  . Hyperlipidemia Mother   . Hypertension Mother   . Deafness Mother   . Colon polyps Mother   . Alcohol abuse Father   . Deafness Father   . Drug abuse Sister   . Hypertension Sister   . Drug abuse Brother   . Rheum arthritis Maternal Aunt   . Lupus Maternal Aunt   . Rheum arthritis Maternal Grandmother   . Hypertension Sister   . Colon cancer Neg Hx   . Esophageal cancer Neg Hx   . Rectal cancer Neg Hx   . Stomach cancer Neg Hx     Social History   Socioeconomic History  . Marital status: Divorced    Spouse name: Not on file  . Number of children: Not on file  . Years of education: Not on file  . Highest education level: Not on file  Occupational History  . Not  on file  Tobacco Use  . Smoking status: Never Smoker  . Smokeless tobacco: Never Used  Substance and Sexual Activity  . Alcohol use: Yes    Alcohol/week: 0.0 standard drinks    Comment: occasional--moderate  . Drug use: No  . Sexual activity: Yes  Other Topics Concern  . Not on file  Social History Narrative  . Not on file   Social Determinants of Health   Financial Resource Strain:   . Difficulty of Paying Living Expenses: Not on file  Food Insecurity:   . Worried About Programme researcher, broadcasting/film/video in the Last Year: Not on file  . Ran Out of Food in the Last Year: Not on file  Transportation Needs:   . Lack of Transportation (Medical): Not on file  . Lack of Transportation (Non-Medical): Not on file  Physical Activity:   . Days of Exercise per Week: Not on file  . Minutes of Exercise per Session: Not on file  Stress:   . Feeling of Stress : Not on file  Social Connections:   . Frequency of Communication with Friends and Family: Not on file  . Frequency of Social Gatherings with Friends and Family: Not on file  . Attends Religious Services: Not on file  . Active Member of Clubs or Organizations: Not on file  . Attends Banker Meetings: Not on file  . Marital Status: Not on file  Intimate Partner Violence:   . Fear of Current or Ex-Partner: Not on file  . Emotionally Abused: Not on file  . Physically Abused: Not on file  . Sexually Abused: Not on file     Constitutional: Denies fever, malaise, fatigue, headache or abrupt weight changes.  HEENT: Denies eye pain, eye redness, ear pain, ringing in the ears, wax buildup, runny nose, nasal congestion, bloody nose, or sore throat. Respiratory: Denies difficulty breathing, shortness of breath, cough or sputum production.   Cardiovascular: Denies chest pain, chest tightness, palpitations or swelling in the hands or feet.  Gastrointestinal: Pt reports intermittent reflux. Denies abdominal pain, bloating, constipation,  diarrhea or blood in the stool.  GU: Denies urgency, frequency, pain with urination, burning sensation, blood in urine, odor or discharge. Musculoskeletal: Denies decrease in range of motion, difficulty with gait, muscle pain or joint pain and swelling.  Skin: Denies redness, rashes, lesions or ulcercations.  Neurological: Pt reports insomnia. Denies dizziness, difficulty with memory, difficulty with speech or problems with balance and coordination.  Psych: Pt reports anxiety. Denies depression, SI/HI.  No other specific complaints in a complete review of  systems (except as listed in HPI above).     Objective:   Physical Exam   BP 118/70   Pulse 74   Temp 98 F (36.7 C) (Temporal)   Ht 5\' 7"  (1.702 m)   Wt 150 lb (68 kg)   SpO2 98%   BMI 23.49 kg/m   Wt Readings from Last 3 Encounters:  08/09/18 149 lb (67.6 kg)  05/03/18 139 lb (63 kg)  04/23/18 139 lb (63 kg)    General: Appears her stated age, well developed, well nourished in NAD. Skin: Warm, dry and intact. No rashes noted. HEENT: Head: normal shape and size; Eyes: sclera white, no icterus, conjunctiva pink, PERRLA and EOMs intact;  Neck:  Neck supple, trachea midline. No masses, lumps or thyromegaly present.  Cardiovascular: Normal rate and rhythm. S1,S2 noted.  No murmur, rubs or gallops noted. No JVD or BLE edema. No carotid bruits noted. Pulmonary/Chest: Normal effort and positive vesicular breath sounds. No respiratory distress. No wheezes, rales or ronchi noted.  Abdomen: Soft and nontender. Normal bowel sounds. No distention or masses noted. Liver, spleen and kidneys non palpable. Musculoskeletal: Strength 5/5 BUE/BLE. No difficulty with gait.  Neurological: Alert and oriented. Cranial nerves II-XII grossly intact. Coordination normal.  Psychiatric: Mood and affect normal. Behavior is normal. Judgment and thought content normal.     BMET    Component Value Date/Time   NA 139 03/12/2018 1446   K 3.6  03/12/2018 1446   CL 103 03/12/2018 1446   CO2 29 03/12/2018 1446   GLUCOSE 89 03/12/2018 1446   BUN 11 03/12/2018 1446   CREATININE 0.69 03/12/2018 1446   CREATININE 1.09 02/03/2017 1650   CALCIUM 9.5 03/12/2018 1446    Lipid Panel     Component Value Date/Time   CHOL 187 03/12/2018 1446   TRIG 82.0 03/12/2018 1446   HDL 59.10 03/12/2018 1446   CHOLHDL 3 03/12/2018 1446   VLDL 16.4 03/12/2018 1446   LDLCALC 112 (H) 03/12/2018 1446   LDLCALC 107 (H) 02/03/2017 1650    CBC    Component Value Date/Time   WBC 2.8 (L) 03/12/2018 1446   RBC 3.62 (L) 03/12/2018 1446   HGB 10.6 (L) 03/12/2018 1446   HCT 31.7 (L) 03/12/2018 1446   PLT 255.0 03/12/2018 1446   MCV 87.6 03/12/2018 1446   MCH 29.1 02/03/2017 1650   MCHC 33.4 03/12/2018 1446   RDW 13.3 03/12/2018 1446    Hgb A1C No results found for: HGBA1C         Assessment & Plan:   Preventative Health Maintenance:  Flu shot UTD Tetanus UTD per her report Pap smear UTD Mammogram scheduled Colon screening UTD Encouraged her to consume a balanced diet and exercise regimen. Advised him to see an eye doctor and dentist annually Will check CBC, CMET, Lipid and Vit D today  RTC in 1 year, sooner if needed Webb Silversmith, NP This visit occurred during the SARS-CoV-2 public health emergency.  Safety protocols were in place, including screening questions prior to the visit, additional usage of staff PPE, and extensive cleaning of exam room while observing appropriate contact time as indicated for disinfecting solutions.

## 2019-04-30 NOTE — Assessment & Plan Note (Signed)
Currently not medicated Encouraged her to keep her hands warm

## 2019-04-30 NOTE — Patient Instructions (Signed)

## 2019-04-30 NOTE — Assessment & Plan Note (Addendum)
Continue Tums CBC and CMET today

## 2019-05-01 ENCOUNTER — Encounter: Payer: Self-pay | Admitting: Internal Medicine

## 2019-05-01 DIAGNOSIS — E559 Vitamin D deficiency, unspecified: Secondary | ICD-10-CM

## 2019-05-01 LAB — CBC
HCT: 32.5 % — ABNORMAL LOW (ref 36.0–46.0)
Hemoglobin: 10.8 g/dL — ABNORMAL LOW (ref 12.0–15.0)
MCHC: 33.3 g/dL (ref 30.0–36.0)
MCV: 89.2 fl (ref 78.0–100.0)
Platelets: 252 10*3/uL (ref 150.0–400.0)
RBC: 3.64 Mil/uL — ABNORMAL LOW (ref 3.87–5.11)
RDW: 13.8 % (ref 11.5–15.5)
WBC: 4.8 10*3/uL (ref 4.0–10.5)

## 2019-05-01 LAB — COMPREHENSIVE METABOLIC PANEL
ALT: 14 U/L (ref 0–35)
AST: 18 U/L (ref 0–37)
Albumin: 4.4 g/dL (ref 3.5–5.2)
Alkaline Phosphatase: 55 U/L (ref 39–117)
BUN: 13 mg/dL (ref 6–23)
CO2: 27 mEq/L (ref 19–32)
Calcium: 9.2 mg/dL (ref 8.4–10.5)
Chloride: 102 mEq/L (ref 96–112)
Creatinine, Ser: 0.91 mg/dL (ref 0.40–1.20)
GFR: 78.71 mL/min (ref >60.00)
Glucose, Bld: 83 mg/dL (ref 70–99)
Potassium: 4 mEq/L (ref 3.5–5.1)
Sodium: 137 mEq/L (ref 135–145)
Total Bilirubin: 0.3 mg/dL (ref 0.2–1.2)
Total Protein: 8 g/dL (ref 6.0–8.3)

## 2019-05-01 LAB — LIPID PANEL
Cholesterol: 184 mg/dL (ref 0–200)
HDL: 65.1 mg/dL (ref >39.00)
LDL Cholesterol: 102 mg/dL — ABNORMAL HIGH (ref 0–99)
NonHDL: 119.2
Total CHOL/HDL Ratio: 3
Triglycerides: 84 mg/dL (ref 0.0–149.0)
VLDL: 16.8 mg/dL (ref 0.0–40.0)

## 2019-05-01 LAB — VITAMIN D 25 HYDROXY (VIT D DEFICIENCY, FRACTURES): VITD: 18.87 ng/mL — ABNORMAL LOW (ref 30.00–100.00)

## 2019-05-01 MED ORDER — VITAMIN D (ERGOCALCIFEROL) 1.25 MG (50000 UNIT) PO CAPS: 50000.0000 [IU] | ORAL_CAPSULE | ORAL | 0 refills | Status: DC

## 2019-05-01 MED FILL — VIT D2 1.25 MG (50,000 UNIT: 1.25 MG | 84 days supply | Qty: 12 | Fill #0

## 2019-05-15 ENCOUNTER — Other Ambulatory Visit: Payer: Self-pay

## 2019-05-15 ENCOUNTER — Ambulatory Visit
Admission: RE | Admit: 2019-05-15 | Discharge: 2019-05-15 | Disposition: A | Discharge status: Home / Self Care | Payer: No Typology Code available for payment source | Source: Ambulatory Visit | Attending: Internal Medicine | Admitting: Internal Medicine

## 2019-05-15 DIAGNOSIS — Z1231 Encounter for screening mammogram for malignant neoplasm of breast: Secondary | ICD-10-CM

## 2019-05-16 ENCOUNTER — Other Ambulatory Visit: Payer: Self-pay | Admitting: Internal Medicine

## 2019-05-16 MED ORDER — BELSOMRA 10 MG PO TABS: 1.0000 | ORAL_TABLET | Freq: Every day | ORAL | 0 refills | Status: DC

## 2019-05-16 MED FILL — BELSOMRA 10 MG TABLET: 10 | 30 days supply | Qty: 30 | Fill #0

## 2019-05-16 NOTE — Telephone Encounter (Signed)
Last filled 02/01/2019.. please advise 

## 2019-05-21 ENCOUNTER — Telehealth: Payer: Self-pay | Admitting: Internal Medicine

## 2019-05-21 NOTE — Telephone Encounter (Signed)
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  Not painful does not itch.  She stated this happened day after she saw you  rheumatology stating not related to lupus.  She recommended derm follow up.  Pt would like to go to [facility] prefs african americ[facility]   Does pt need office or can you do referral without being seen.  She did take picture can send through my chart

## 2019-05-21 NOTE — Telephone Encounter (Signed)
Have her send a picture. I can place referral but I have no idea if they have an african [place] providers at there office.

## 2019-05-23 ENCOUNTER — Telehealth: Payer: Self-pay

## 2019-05-23 DIAGNOSIS — M3500 Sicca syndrome, unspecified: Secondary | ICD-10-CM

## 2019-05-23 DIAGNOSIS — I7300 Raynaud's syndrome without gangrene: Secondary | ICD-10-CM

## 2019-05-23 DIAGNOSIS — M329 Systemic lupus erythematosus, unspecified: Secondary | ICD-10-CM

## 2019-05-23 MED FILL — TRIAMCINOLONE 0.1% CREAM: 0.1 | 30 days supply | Qty: 80 | Fill #0

## 2019-05-23 NOTE — Telephone Encounter (Signed)
Needs new referral for this year, pt had appt 05/09/2019

## 2019-05-27 MED FILL — HYDROXYCHLOROQUINE SULFATE: 200 | 90 days supply | Qty: 90 | Fill #0

## 2019-06-05 MED FILL — HYDROXYCHLOROQUINE SULFATE: 200 | 90 days supply | Qty: 90 | Fill #0

## 2019-06-13 ENCOUNTER — Other Ambulatory Visit: Payer: Self-pay | Admitting: Internal Medicine

## 2019-06-13 MED ORDER — BELSOMRA 10 MG PO TABS: 1.0000 | ORAL_TABLET | Freq: Every day | ORAL | 0 refills | Status: DC

## 2019-06-13 NOTE — Telephone Encounter (Signed)
Last filled 05/16/2019, [facility] closed on weekend so note to pharmacy [facility] on or after 06/14/2019

## 2019-06-14 MED FILL — BELSOMRA 10 MG TABLET: 10 | 30 days supply | Qty: 30 | Fill #0

## 2019-06-24 MED FILL — BELSOMRA 10 MG TABLET: 10 | 30 days supply | Qty: 30 | Fill #0

## 2019-07-17 IMAGING — MG DIGITAL SCREENING BILATERAL MAMMOGRAM WITH TOMO AND CAD
8 series · 8 of 24 positions shown · non-contrast
Comparison: Previous exam(s).

CLINICAL DATA: Screening.

EXAM:
DIGITAL SCREENING BILATERAL MAMMOGRAM WITH TOMO AND CAD

[R MLO synth-2D]
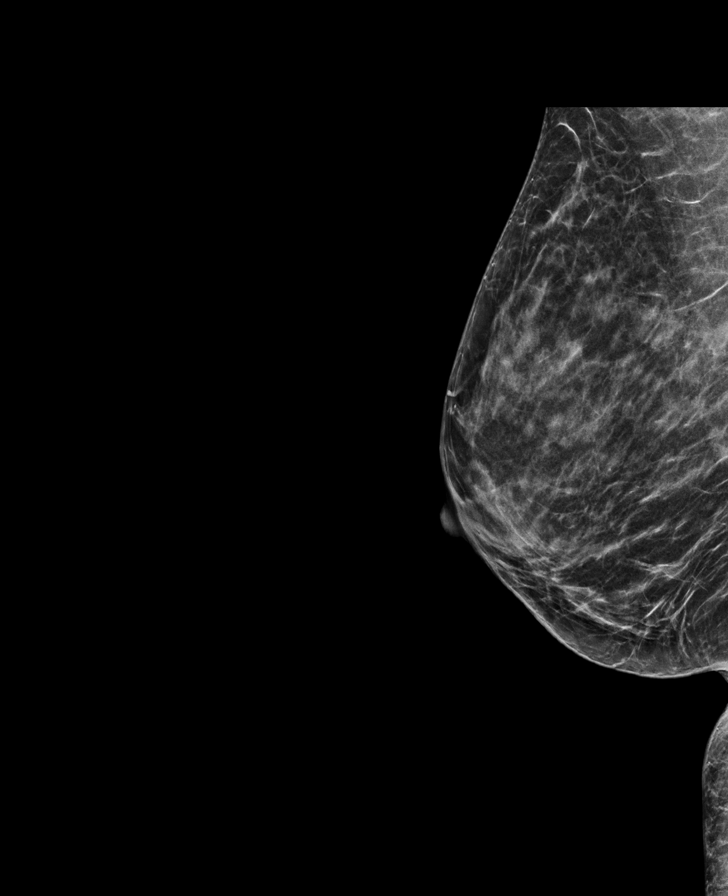

[R CC synth-2D]
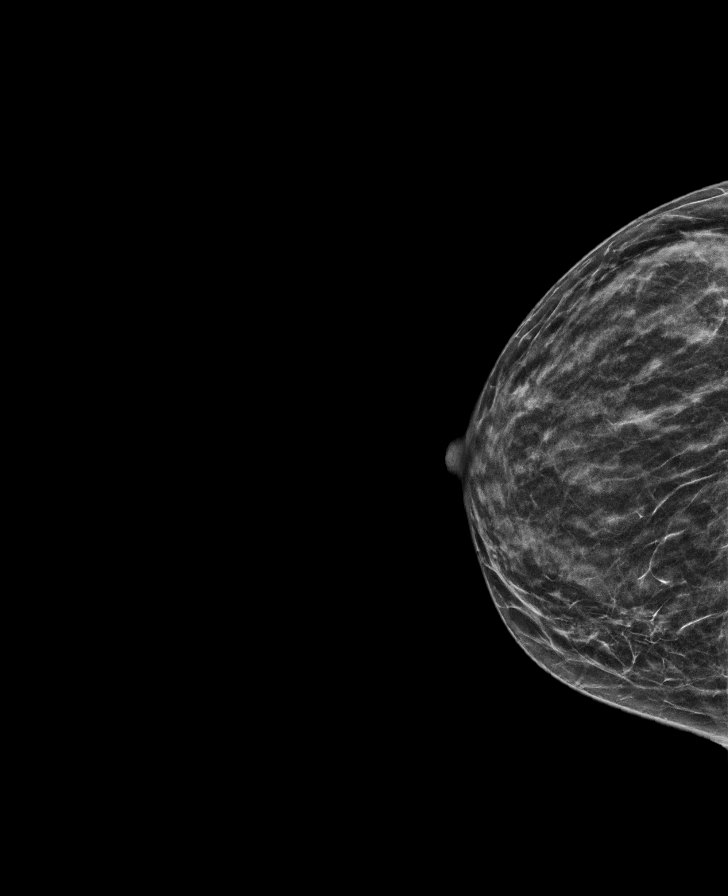

[L CC synth-2D]
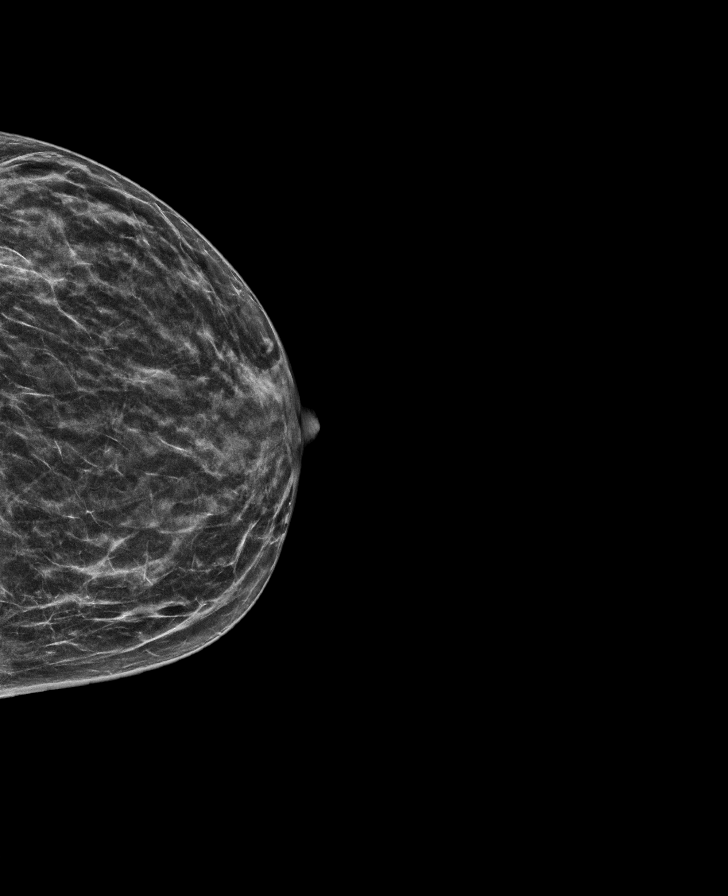

[L MLO synth-2D]
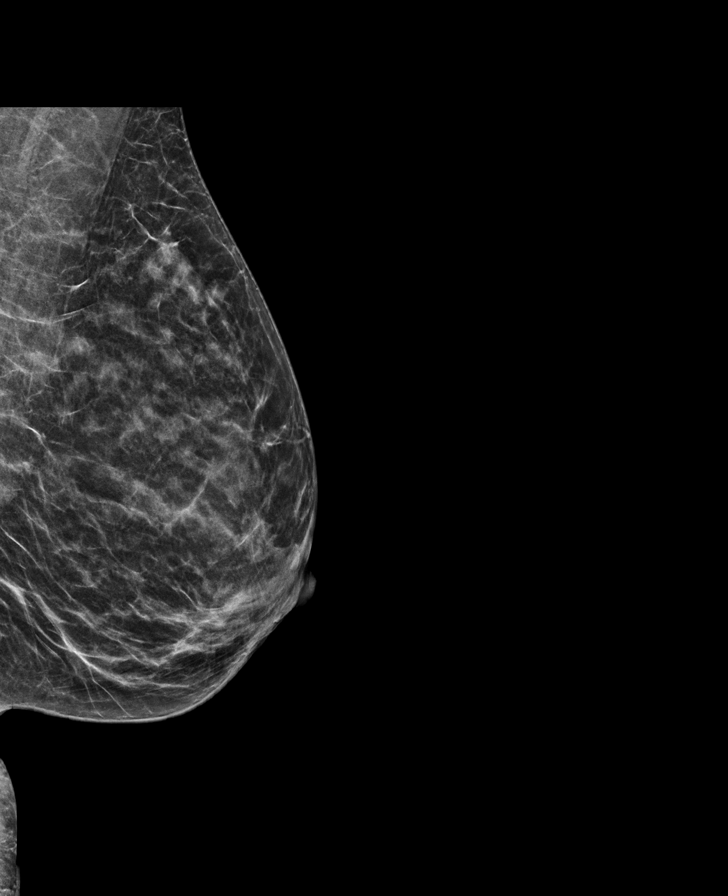

[L MLO tomo · tomo slice 25/50.0]
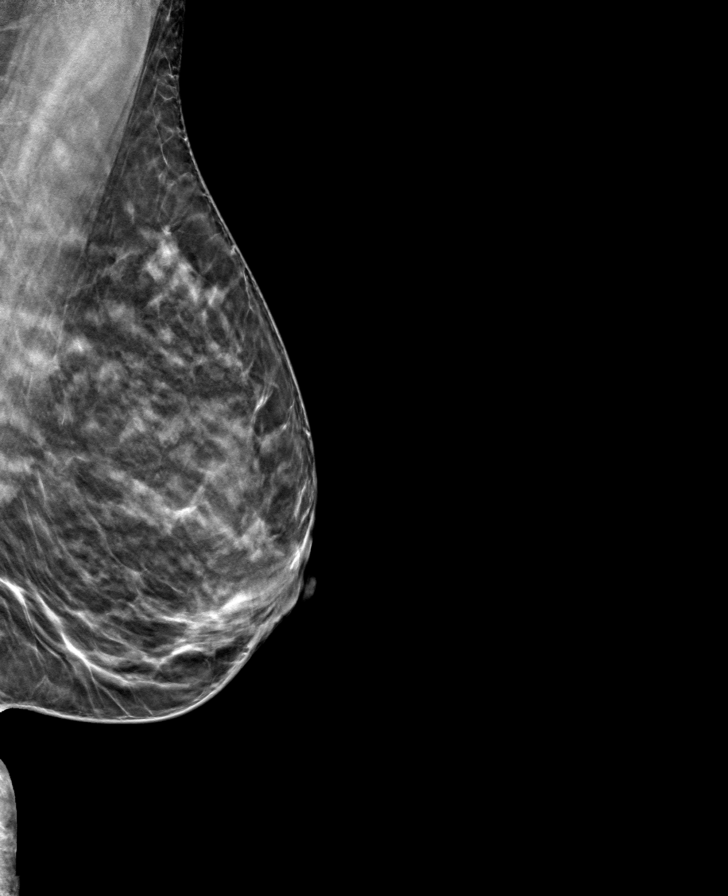

[L CC tomo · tomo slice 23/46.0]
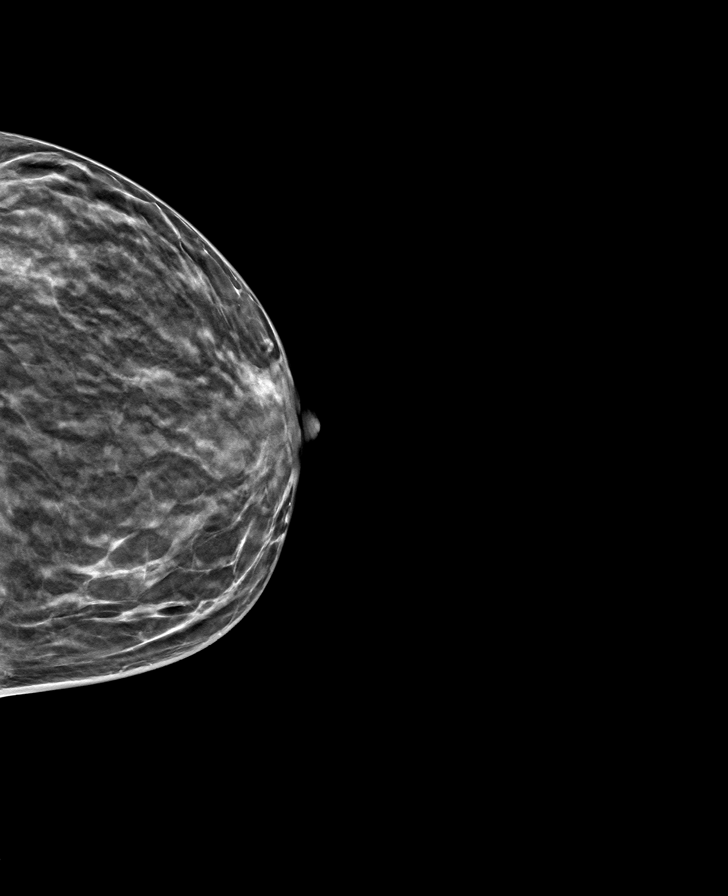

[R CC tomo · tomo slice 23/45.0]
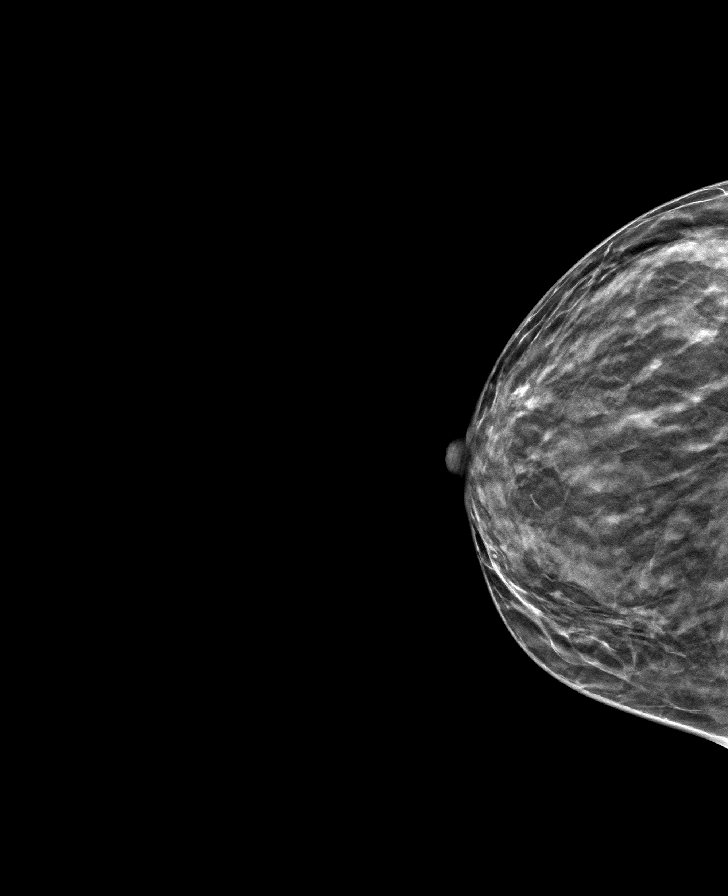

[R MLO tomo · tomo slice 24/47.0]
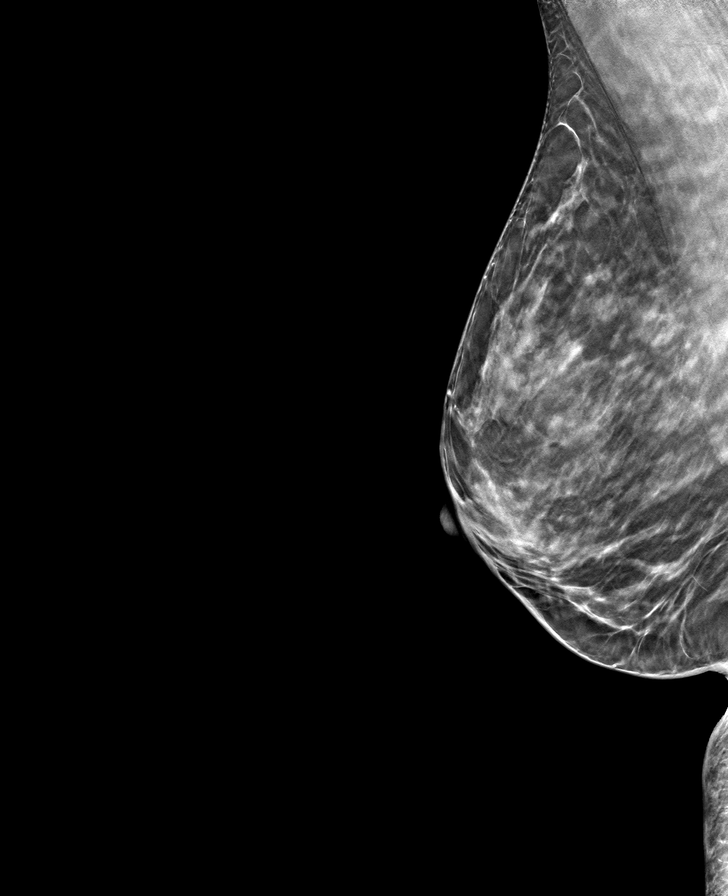

[8 of 24 positions shown; findings below may reference images not displayed]

ACR Breast Density Category b: There are scattered areas of
fibroglandular density.
FINDINGS: There are no findings suspicious for malignancy. Images were
processed with CAD.
IMPRESSION: No mammographic evidence of malignancy. A result letter of this
screening mammogram will be mailed directly to the patient.

RECOMMENDATION:
Screening mammogram in one year. (Code:[id number])

BI-RADS CATEGORY  1: Negative.

## 2019-07-22 ENCOUNTER — Other Ambulatory Visit: Payer: Self-pay | Admitting: Internal Medicine

## 2019-07-22 MED ORDER — BELSOMRA 10 MG PO TABS: 1.0000 | ORAL_TABLET | Freq: Every day | ORAL | 0 refills | Status: DC

## 2019-07-22 NOTE — Telephone Encounter (Signed)
Last filled 06/14/2019... please advise 

## 2019-07-24 MED FILL — BELSOMRA 10 MG TABLET: 10 | 30 days supply | Qty: 30 | Fill #0

## 2019-08-19 ENCOUNTER — Telehealth: Payer: No Typology Code available for payment source | Admitting: Family

## 2019-08-19 DIAGNOSIS — R399 Unspecified symptoms and signs involving the genitourinary system: Secondary | ICD-10-CM | POA: Diagnosis not present

## 2019-08-19 MED ORDER — NITROFURANTOIN MONOHYD MACRO 100 MG PO CAPS: 100.0000 mg | ORAL_CAPSULE | Freq: Two times a day (BID) | ORAL | 0 refills | Status: DC

## 2019-08-19 NOTE — Progress Notes (Signed)
We are sorry that you are not feeling well.  Here is how we plan to help! ° °Based on what you shared with me it looks like you most likely have a simple urinary tract infection. ° °A UTI (Urinary Tract Infection) is a bacterial infection of the bladder. ° °Most cases of urinary tract infections are simple to treat but a key part of your care is to encourage you to drink plenty of fluids and watch your symptoms carefully. ° °I have prescribed MacroBid 100 mg twice a day for 5 days.  Your symptoms should gradually improve. Call us if the burning in your urine worsens, you develop worsening fever, back pain or pelvic pain or if your symptoms do not resolve after completing the antibiotic. ° °Urinary tract infections can be prevented by drinking plenty of water to keep your body hydrated.  Also be sure when you wipe, wipe from front to back and don't hold it in!  If possible, empty your bladder every 4 hours. ° °Your e-visit answers were reviewed by a board certified advanced clinical practitioner to complete your personal care plan.  Depending on the condition, your plan could have included both over the counter or prescription medications. ° °If there is a problem please reply  once you have received a response from your provider. ° °Your safety is important to us.  If you have drug allergies check your prescription carefully.   ° °You can use MyChart to ask questions about today’s visit, request a non-urgent call back, or ask for a work or school excuse for 24 hours related to this e-Visit. If it has been greater than 24 hours you will need to follow up with your provider, or enter a new e-Visit to address those concerns. ° ° °You will get an e-mail in the next two days asking about your experience.  I hope that your e-visit has been valuable and will speed your recovery. Thank you for using e-visits. ° °Approximately 5 minutes was spent documenting and reviewing patient's chart.  ° ° ° °

## 2019-08-21 ENCOUNTER — Other Ambulatory Visit: Payer: Self-pay | Admitting: Internal Medicine

## 2019-08-21 MED ORDER — BELSOMRA 10 MG PO TABS: 1.0000 | ORAL_TABLET | Freq: Every day | ORAL | 0 refills | Status: DC

## 2019-08-21 NOTE — Telephone Encounter (Signed)
Last filled 07/22/2019... please advise  

## 2019-08-27 ENCOUNTER — Encounter: Payer: Self-pay | Admitting: Internal Medicine

## 2019-08-28 MED ORDER — FLUCONAZOLE 150 MG PO TABS: 150.0000 mg | ORAL_TABLET | Freq: Once | ORAL | 0 refills | Status: AC

## 2019-09-18 ENCOUNTER — Other Ambulatory Visit: Payer: No Typology Code available for payment source

## 2019-09-18 ENCOUNTER — Encounter: Payer: Self-pay | Admitting: Internal Medicine

## 2019-09-18 ENCOUNTER — Telehealth (INDEPENDENT_AMBULATORY_CARE_PROVIDER_SITE_OTHER): Payer: No Typology Code available for payment source | Admitting: Internal Medicine

## 2019-09-18 DIAGNOSIS — N3000 Acute cystitis without hematuria: Secondary | ICD-10-CM

## 2019-09-18 DIAGNOSIS — R05 Cough: Secondary | ICD-10-CM

## 2019-09-18 DIAGNOSIS — R300 Dysuria: Secondary | ICD-10-CM

## 2019-09-18 DIAGNOSIS — R8290 Unspecified abnormal findings in urine: Secondary | ICD-10-CM

## 2019-09-18 DIAGNOSIS — R059 Cough, unspecified: Secondary | ICD-10-CM

## 2019-09-18 LAB — POC URINALSYSI DIPSTICK (AUTOMATED)
Bilirubin, UA: NEGATIVE
Glucose, UA: NEGATIVE
Ketones, UA: NEGATIVE
Nitrite, UA: POSITIVE
Protein, UA: POSITIVE — AB
Spec Grav, UA: 1.02 (ref 1.010–1.025)
Urobilinogen, UA: 0.2 E.U./dL
pH, UA: 6 (ref 5.0–8.0)

## 2019-09-18 MED ORDER — FLUCONAZOLE 150 MG PO TABS: 150.0000 mg | ORAL_TABLET | Freq: Once | ORAL | 0 refills | Status: AC

## 2019-09-18 MED ORDER — SULFAMETHOXAZOLE-TRIMETHOPRIM 400-80 MG PO TABS: 1.0000 | ORAL_TABLET | Freq: Two times a day (BID) | ORAL | 0 refills | Status: DC

## 2019-09-18 MED ORDER — HYDROCODONE-HOMATROPINE 5-1.5 MG/5ML PO SYRP: 5.0000 mL | ORAL_SOLUTION | Freq: Three times a day (TID) | ORAL | 0 refills | Status: DC | PRN

## 2019-09-18 MED FILL — FLUCONAZOLE 150 MG TABLET: 150 | 1 days supply | Qty: 1 | Fill #0

## 2019-09-18 MED FILL — SULFAMETHOXAZOLE-TMP SS TAB: 400-80 | 5 days supply | Qty: 10 | Fill #0

## 2019-09-18 MED FILL — HYDROCODONE-HOMATROPINE SOL: 5-1.5 | 8 days supply | Qty: 120 | Fill #0

## 2019-09-18 NOTE — Addendum Note (Signed)
Addended by: Roena Malady on: 09/18/2019 04:10 PM   Modules accepted: Orders

## 2019-09-18 NOTE — Patient Instructions (Signed)

## 2019-09-18 NOTE — Progress Notes (Signed)
Virtual Visit via Video Note  I connected with Thana Ates on 09/18/19 at  4:15 PM EDT by a video enabled telemedicine application and verified that I am speaking with the correct person using two identifiers.  Webb Silversmith, NP-C and Cheryl Williams (patient) participated in this visit.  Location: Patient: In her car Provider: Office   I discussed the limitations of evaluation and management by telemedicine and the availability of in person appointments. The patient expressed understanding and agreed to proceed.  History of Present Illness:  Pt reports cloudy urine with odor, and dysuria. This started yesterday. She denies urgency, frequency, blood in her urine, vaginal discharge, odor or abnormal uterine bleeding. She denies fever, chills, nausea or low back pain. She has not taken anything OTC for her symptoms. She reports history of UTI 1 month ago, did E visit, prescribed Bactrim and symptoms completely resolved.  She also reports scratchy throat and cough. This started 5 days ago. The scratchy throat has improved. The cough is mostly nonproductive. The cough is worse at night. She denies headache, runny nose, nasal congestion, ear pain, loss of taste or smell or SOB. She denies fever or chills but has had body aches. She has tried Ibuprofen with minimal relief. She has had sick contacts but denies exposure to Covid. She has had her Covid vaccine.  Past Medical History:  Diagnosis Date  . Anemia   . Anxiety    claustrphobia  . Chicken pox   . GERD (gastroesophageal reflux disease)   . IBS (irritable bowel syndrome)   . Lupus ([place])   . Raynaud disease   . Sjogrens syndrome ([place])     Current Outpatient Medications  Medication Sig Dispense Refill  . ALPRAZolam (XANAX) 0.5 MG tablet Take 1 tablet (0.5 mg total) by mouth daily as needed for anxiety. 20 tablet 0  . Biotin 5000 MCG CAPS Take by mouth daily.    . hydroxychloroquine (PLAQUENIL) 200 MG tablet Take by mouth  daily.    Marland Kitchen ibuprofen (ADVIL,MOTRIN) 600 MG tablet TAKE 1 TABLET BY MOUTH 3 TIMES DAILY. 60 tablet 5  . nitrofurantoin, macrocrystal-monohydrate, (MACROBID) 100 MG capsule Take 1 capsule (100 mg total) by mouth 2 (two) times daily. 1 po BId 10 capsule 0  . propranolol (INDERAL) 10 MG tablet TAKE 1 TABLET BY MOUTH DAILY AS NEEDED. 30 tablet 1  . Suvorexant (BELSOMRA) 10 MG TABS Take 1 tablet by mouth at bedtime. 30 tablet 0  . valACYclovir (VALTREX) 1000 MG tablet Take 0.5 tablets (500 mg total) by mouth 2 (two) times daily. Times 3 days for recurrent outbreaks and 1 tab daily for suppressive therapy. 30 tablet 0  . Vitamin D, Ergocalciferol, (DRISDOL) 1.25 MG (50000 UNIT) CAPS capsule Take 1 capsule (50,000 Units total) by mouth every 7 (seven) days. 12 capsule 0   No current facility-administered medications for this visit.    Allergies  Allergen Reactions  . Penicillins      Rash and hives at age 71  . Imuran [Azathioprine] Other (See Comments)    Mental hallucinations    Family History  Problem Relation Age of Onset  . Hyperlipidemia Mother   . Hypertension Mother   . Deafness Mother   . Colon polyps Mother   . Alcohol abuse Father   . Deafness Father   . Drug abuse Sister   . Hypertension Sister   . Drug abuse Brother   . Rheum arthritis Maternal Aunt   . Lupus Maternal Aunt   .  Rheum arthritis Maternal Grandmother   . Hypertension Sister   . Colon cancer Neg Hx   . Esophageal cancer Neg Hx   . Rectal cancer Neg Hx   . Stomach cancer Neg Hx     Social History   Socioeconomic History  . Marital status: Divorced    Spouse name: Not on file  . Number of children: Not on file  . Years of education: Not on file  . Highest education level: Not on file  Occupational History  . Not on file  Tobacco Use  . Smoking status: Never Smoker  . Smokeless tobacco: Never Used  Substance and Sexual Activity  . Alcohol use: Yes    Alcohol/week: 0.0 standard drinks    Comment:  occasional--moderate  . Drug use: No  . Sexual activity: Yes  Other Topics Concern  . Not on file  Social History Narrative  . Not on file   Social Determinants of Health   Financial Resource Strain:   . Difficulty of Paying Living Expenses:   Food Insecurity:   . Worried About Programme researcher, broadcasting/film/video in the Last Year:   . Barista in the Last Year:   Transportation Needs:   . Freight forwarder (Medical):   Marland Kitchen Lack of Transportation (Non-Medical):   Physical Activity:   . Days of Exercise per Week:   . Minutes of Exercise per Session:   Stress:   . Feeling of Stress :   Social Connections:   . Frequency of Communication with Friends and Family:   . Frequency of Social Gatherings with Friends and Family:   . Attends Religious Services:   . Active Member of Clubs or Organizations:   . Attends Banker Meetings:   Marland Kitchen Marital Status:   Intimate Partner Violence:   . Fear of Current or Ex-Partner:   . Emotionally Abused:   Marland Kitchen Physically Abused:   . Sexually Abused:      Constitutional: Denies fever, malaise, fatigue, headache or abrupt weight changes.  HEENT: Denies eye pain, eye redness, ear pain, ringing in the ears, wax buildup, runny nose, nasal congestion, bloody nose, or sore throat. Respiratory: Pt reports cough. Denies difficulty breathing, shortness of breath, or sputum production.   Cardiovascular: Denies chest pain, chest tightness, palpitations or swelling in the hands or feet.  Gastrointestinal: Denies abdominal pain, bloating, constipation, diarrhea or blood in the stool.  GU: Pt reports cloudy urine with odor, dysuria. Denies urgency, frequency, burning sensation, blood in urine, odor or discharge. Musculoskeletal: Pt reports body aches. Denies decrease in range of motion, difficulty with gait, or joint pain and swelling.  Skin: Denies redness, rashes, lesions or ulcercations.   No other specific complaints in a complete review of systems  (except as listed in HPI above).  Observations/Objective:   Wt Readings from Last 3 Encounters:  04/30/19 150 lb (68 kg)  08/09/18 149 lb (67.6 kg)  05/03/18 139 lb (63 kg)    General: Appears her stated age, well developed, well nourished in NAD. Skin: Warm, dry and intact. No rashes noted. HEENT: Head: normal shape and size; Nose: no congestion noted; Throat/Mouth: No hoarseness noted. Neck:  No adenopathy noted. Cardiovascular: Normal rate and rhythm.  Pulmonary/Chest: Normal effort and positive vesicular breath sounds. No respiratory distress. No wheezes, rales or ronchi noted.  Neurological: Alert and oriented.    BMET    Component Value Date/Time   NA 137 04/30/2019 1507   K 4.0 04/30/2019 1507  CL 102 04/30/2019 1507   CO2 27 04/30/2019 1507   GLUCOSE 83 04/30/2019 1507   BUN 13 04/30/2019 1507   CREATININE 0.91 04/30/2019 1507   CREATININE 1.09 02/03/2017 1650   CALCIUM 9.2 04/30/2019 1507    Lipid Panel     Component Value Date/Time   CHOL 184 04/30/2019 1507   TRIG 84.0 04/30/2019 1507   HDL 65.10 04/30/2019 1507   CHOLHDL 3 04/30/2019 1507   VLDL 16.8 04/30/2019 1507   LDLCALC 102 (H) 04/30/2019 1507   LDLCALC 107 (H) 02/03/2017 1650    CBC    Component Value Date/Time   WBC 4.8 04/30/2019 1507   RBC 3.64 (L) 04/30/2019 1507   HGB 10.8 (L) 04/30/2019 1507   HCT 32.5 (L) 04/30/2019 1507   PLT 252.0 04/30/2019 1507   MCV 89.2 04/30/2019 1507   MCH 29.1 02/03/2017 1650   MCHC 33.3 04/30/2019 1507   RDW 13.8 04/30/2019 1507    Hgb A1C No results found for: HGBA1C      Assessment and Plan:  Cloudy Urine, Urine Odor, Dysuria:  Urinalysis: 3+ leuks, pos nitrites, 1 + blood Will send urine culture RX for Bactrim 400-80 mg 1 tab PO BID x 10 days Push fluids Ok to take AZO otc RX for Diflucan 150 mg PO x 1 for antibiotic induced yeast infection  Cough:  Likely viral Mucinex 600 mg Q12H x 3 days RX for Hycodan for cough  Return  precautions discussed  Follow Up Instructions:    I discussed the assessment and treatment plan with the patient. The patient was provided an opportunity to ask questions and all were answered. The patient agreed with the plan and demonstrated an understanding of the instructions.   The patient was advised to call back or seek an in-person evaluation if the symptoms worsen or if the condition fails to improve as anticipated.    Nicki Reaper, NP

## 2019-09-20 LAB — URINE CULTURE
MICRO NUMBER:: 10544090
SPECIMEN QUALITY:: ADEQUATE

## 2019-09-23 ENCOUNTER — Other Ambulatory Visit: Payer: Self-pay | Admitting: Internal Medicine

## 2019-09-23 MED ORDER — BELSOMRA 10 MG PO TABS: 1.0000 | ORAL_TABLET | Freq: Every day | ORAL | 0 refills | Status: DC

## 2019-09-23 NOTE — Telephone Encounter (Signed)
Rx was last refilled 08/21/19 for #30 with 0 refills.  Last seen on 09/18/19 for a video visit, and has a CPE on 05/22/20. Ok to refill?

## 2019-10-07 ENCOUNTER — Encounter: Payer: Self-pay | Admitting: Internal Medicine

## 2019-10-07 ENCOUNTER — Telehealth: Payer: No Typology Code available for payment source | Admitting: Family

## 2019-10-07 DIAGNOSIS — R300 Dysuria: Secondary | ICD-10-CM

## 2019-10-07 NOTE — Progress Notes (Signed)
It is important that we do not add antibiotics to your treatment at this time. Antibiotics may mask the infection and we need to know why this is happening. If you see your primary care provider sooner, they may be willing to give you a note fore work. We really need to see you in person.   Based on what you shared with me, I feel your condition warrants further evaluation and I recommend that you be seen for a face to face visit.  Please contact your primary care physician practice to be seen. Many offices offer virtual options to be seen via video if you are not comfortable going in person to a medical facility at this time.  If you do not have a PCP, [facility] offers a free physician referral service available at [phone]. Our trained staff has the experience, knowledge and resources to put you in touch with a physician who is right for you.   You also have the option of a video visit through [email or web address]  If you are having a true medical emergency please call 911.  NOTE: If you entered your credit card information for this eVisit, you will not be charged. You may see a "hold" on your card for the $35 but that hold will drop off and you will not have a charge processed.  Your e-visit answers were reviewed by a board certified advanced clinical practitioner to complete your personal care plan.  Thank you for using e-Visits.

## 2019-10-09 ENCOUNTER — Encounter: Payer: Self-pay | Admitting: Internal Medicine

## 2019-10-09 ENCOUNTER — Ambulatory Visit (INDEPENDENT_AMBULATORY_CARE_PROVIDER_SITE_OTHER): Payer: No Typology Code available for payment source | Admitting: Internal Medicine

## 2019-10-09 ENCOUNTER — Ambulatory Visit: Payer: No Typology Code available for payment source | Admitting: Internal Medicine

## 2019-10-09 ENCOUNTER — Other Ambulatory Visit: Payer: Self-pay

## 2019-10-09 VITALS — BP 114/76 | HR 76 | Temp 97.7°F | Wt 147.0 lb

## 2019-10-09 DIAGNOSIS — R300 Dysuria: Secondary | ICD-10-CM | POA: Diagnosis not present

## 2019-10-09 DIAGNOSIS — R8290 Unspecified abnormal findings in urine: Secondary | ICD-10-CM | POA: Diagnosis not present

## 2019-10-09 LAB — POC URINALSYSI DIPSTICK (AUTOMATED)
Bilirubin, UA: NEGATIVE
Blood, UA: NEGATIVE
Glucose, UA: NEGATIVE
Ketones, UA: NEGATIVE
Nitrite, UA: NEGATIVE
Protein, UA: POSITIVE — AB
Spec Grav, UA: 1.025 (ref 1.010–1.025)
Urobilinogen, UA: 0.2 E.U./dL
pH, UA: 6 (ref 5.0–8.0)

## 2019-10-09 NOTE — Addendum Note (Signed)
Addended by: Roena Malady on: 10/09/2019 05:05 PM   Modules accepted: Orders

## 2019-10-09 NOTE — Patient Instructions (Signed)

## 2019-10-09 NOTE — Progress Notes (Signed)
HPI  Pt presents to the clinic today with c/o dysuria and cloudy urine. This started 2 days ago. She denies urinary frequency, urgency or blood in her urine. She denies vaginal discharge, irritation, odor or abnormal bleeding. She denies fever, chills, nausea or low back pain. She has increased her water intake with good improvement in symptoms. Patient reports this is her third UTI in the last 6 to 8 weeks. She reports this usually occurs after intercourse.   Review of Systems  Past Medical History:  Diagnosis Date  . Anemia   . Anxiety    claustrphobia  . Chicken pox   . GERD (gastroesophageal reflux disease)   . IBS (irritable bowel syndrome)   . Lupus (HCC)   . Raynaud disease   . Sjogrens syndrome (HCC)     Family History  Problem Relation Age of Onset  . Hyperlipidemia Mother   . Hypertension Mother   . Deafness Mother   . Colon polyps Mother   . Alcohol abuse Father   . Deafness Father   . Drug abuse Sister   . Hypertension Sister   . Drug abuse Brother   . Rheum arthritis Maternal Aunt   . Lupus Maternal Aunt   . Rheum arthritis Maternal Grandmother   . Hypertension Sister   . Colon cancer Neg Hx   . Esophageal cancer Neg Hx   . Rectal cancer Neg Hx   . Stomach cancer Neg Hx     Social History   Socioeconomic History  . Marital status: Divorced    Spouse name: Not on file  . Number of children: Not on file  . Years of education: Not on file  . Highest education level: Not on file  Occupational History  . Not on file  Tobacco Use  . Smoking status: Never Smoker  . Smokeless tobacco: Never Used  Substance and Sexual Activity  . Alcohol use: Yes    Alcohol/week: 0.0 standard drinks    Comment: occasional--moderate  . Drug use: No  . Sexual activity: Yes  Other Topics Concern  . Not on file  Social History Narrative  . Not on file   Social Determinants of Health   Financial Resource Strain:   . Difficulty of Paying Living Expenses:   Food  Insecurity:   . Worried About Programme researcher, broadcasting/film/video in the Last Year:   . Barista in the Last Year:   Transportation Needs:   . Freight forwarder (Medical):   Marland Kitchen Lack of Transportation (Non-Medical):   Physical Activity:   . Days of Exercise per Week:   . Minutes of Exercise per Session:   Stress:   . Feeling of Stress :   Social Connections:   . Frequency of Communication with Friends and Family:   . Frequency of Social Gatherings with Friends and Family:   . Attends Religious Services:   . Active Member of Clubs or Organizations:   . Attends Banker Meetings:   Marland Kitchen Marital Status:   Intimate Partner Violence:   . Fear of Current or Ex-Partner:   . Emotionally Abused:   Marland Kitchen Physically Abused:   . Sexually Abused:     Allergies  Allergen Reactions  . Penicillins      Rash and hives at age 76  . Imuran [Azathioprine] Other (See Comments)    Mental hallucinations     Constitutional: Denies fever, malaise, fatigue, headache or abrupt weight changes.   GU: Pt reports dysuria.  Denies urgency, frequency, burning sensation, blood in urine, odor or discharge. Skin: Denies redness, rashes, lesions or ulcercations.   No other specific complaints in a complete review of systems (except as listed in HPI above).    Objective:   Physical Exam  BP 114/76   Pulse 76   Temp 97.7 F (36.5 C) (Temporal)   Wt 147 lb (66.7 kg)   LMP 01/18/2019   SpO2 98%   BMI 23.02 kg/m   Wt Readings from Last 3 Encounters:  04/30/19 150 lb (68 kg)  08/09/18 149 lb (67.6 kg)  05/03/18 139 lb (63 kg)    General: Appears her stated age, well developed, well nourished in NAD. Cardiovascular: Normal rate and rhythm. S1,S2 noted.   Pulmonary/Chest: Normal effort and positive vesicular breath sounds. No respiratory distress. No wheezes, rales or ronchi noted.  Abdomen: Soft and nontender. Normal bowel sounds. No distention or masses noted.  No CVA tenderness.         Assessment & Plan:   Dysuria, Cloudy Urine:  Urinalysis: 2+ leuks Will send urine culture Will hold off on abx until culture back as she is clinically improving OK to take AZO OTC Drink plenty of fluids We did discuss the role of antibiotics to prevent post coital UTI's   RTC as needed or if symptoms persist. Webb Silversmith, NP This visit occurred during the SARS-CoV-2 public health emergency.  Safety protocols were in place, including screening questions prior to the visit, additional usage of staff PPE, and extensive cleaning of exam room while observing appropriate contact time as indicated for disinfecting solutions.

## 2019-10-11 ENCOUNTER — Encounter: Payer: Self-pay | Admitting: Internal Medicine

## 2019-10-11 LAB — URINE CULTURE
MICRO NUMBER:: 10625278
SPECIMEN QUALITY:: ADEQUATE

## 2019-10-11 MED ORDER — SULFAMETHOXAZOLE-TRIMETHOPRIM 400-80 MG PO TABS
1.0000 | ORAL_TABLET | Freq: Two times a day (BID) | ORAL | 0 refills | Status: DC
Start: 2019-10-11 — End: 2019-10-11

## 2019-10-11 MED ORDER — SULFAMETHOXAZOLE-TRIMETHOPRIM 400-80 MG PO TABS: 1.0000 | ORAL_TABLET | Freq: Two times a day (BID) | ORAL | 0 refills | Status: DC

## 2019-10-11 NOTE — Addendum Note (Signed)
Addended by: Lorre Munroe on: 10/11/2019 01:54 PM   Modules accepted: Orders

## 2019-10-14 ENCOUNTER — Ambulatory Visit: Payer: No Typology Code available for payment source | Admitting: Internal Medicine

## 2019-10-14 ENCOUNTER — Other Ambulatory Visit: Payer: Self-pay | Admitting: Internal Medicine

## 2019-10-15 MED ORDER — ALPRAZOLAM 0.5 MG PO TABS: 0.5000 mg | ORAL_TABLET | Freq: Every day | ORAL | 0 refills | Status: DC | PRN

## 2019-10-15 MED FILL — ALPRAZolam 0.5 MG TABS: 0.5 | 20 days supply | Qty: 20 | Fill #0

## 2019-10-15 NOTE — Telephone Encounter (Signed)
Last filled 11/2018... please advise 

## 2019-10-18 ENCOUNTER — Encounter: Payer: Self-pay | Admitting: Internal Medicine

## 2019-10-18 ENCOUNTER — Ambulatory Visit
Admission: RE | Admit: 2019-10-18 | Discharge: 2019-10-18 | Discharge status: Absent without leave | Payer: No Typology Code available for payment source | Source: Ambulatory Visit

## 2019-10-18 NOTE — ED Provider Notes (Signed)
Patient is [place] in [place]  I explained to her that we cannot do a video visit unless she is in [place].  I suggested that she seek medical care at a local urgent care.   Mickie Bail, NP 10/18/19 1112

## 2019-10-24 ENCOUNTER — Encounter (HOSPITAL_COMMUNITY): Payer: Self-pay

## 2019-10-24 ENCOUNTER — Other Ambulatory Visit: Payer: Self-pay | Admitting: Internal Medicine

## 2019-10-24 ENCOUNTER — Ambulatory Visit (HOSPITAL_COMMUNITY)
Admission: EM | Admit: 2019-10-24 | Discharge: 2019-10-24 | Disposition: A | Discharge status: Home / Self Care | Payer: No Typology Code available for payment source | Attending: Physician Assistant | Admitting: Physician Assistant

## 2019-10-24 ENCOUNTER — Encounter: Payer: Self-pay | Admitting: Internal Medicine

## 2019-10-24 ENCOUNTER — Other Ambulatory Visit: Payer: Self-pay

## 2019-10-24 DIAGNOSIS — N3000 Acute cystitis without hematuria: Secondary | ICD-10-CM | POA: Insufficient documentation

## 2019-10-24 DIAGNOSIS — L509 Urticaria, unspecified: Secondary | ICD-10-CM | POA: Diagnosis present

## 2019-10-24 DIAGNOSIS — T7840XA Allergy, unspecified, initial encounter: Secondary | ICD-10-CM | POA: Insufficient documentation

## 2019-10-24 LAB — POCT URINALYSIS DIP (DEVICE)
Bilirubin Urine: NEGATIVE
Glucose, UA: NEGATIVE mg/dL
Ketones, ur: NEGATIVE mg/dL
Nitrite: NEGATIVE
Protein, ur: NEGATIVE mg/dL
Specific Gravity, Urine: 1.015 (ref 1.005–1.030)
Urobilinogen, UA: 1 mg/dL (ref 0.0–1.0)
pH: 6.5 (ref 5.0–8.0)

## 2019-10-24 MED ORDER — TRIMETHOPRIM 100 MG PO TABS: ORAL_TABLET | ORAL | 0 refills | Status: DC

## 2019-10-24 MED ORDER — CEPHALEXIN 500 MG PO CAPS: 500.0000 mg | ORAL_CAPSULE | Freq: Two times a day (BID) | ORAL | 0 refills | Status: AC

## 2019-10-24 MED ORDER — BELSOMRA 10 MG PO TABS: 1.0000 | ORAL_TABLET | Freq: Every day | ORAL | 0 refills | Status: DC

## 2019-10-24 MED FILL — BELSOMRA 10 MG TABLET: 10 | 30 days supply | Qty: 30 | Fill #0

## 2019-10-24 MED FILL — ALPRAZolam 0.5 MG TABS: 0.5 | 20 days supply | Qty: 20 | Fill #0

## 2019-10-24 MED FILL — TRIMETHOPRIM 100 MG TABLET: 100 | 30 days supply | Qty: 30 | Fill #0

## 2019-10-24 NOTE — Discharge Instructions (Signed)
Take benadryl tonight Take zyrtex tomorrow  We sent a culture of your urine, we will call if we need to change anything  Wait until current rash resolves prior to new medicine -start keflex tomorrow  2 times a day  Follow up with your PCP

## 2019-10-24 NOTE — Telephone Encounter (Signed)
Last filled 09/23/2019... please advise  

## 2019-10-24 NOTE — ED Provider Notes (Signed)
[facility]    CSN: [id number] Arrival date & time: 10/24/19  1835      History   Chief Complaint Chief Complaint  Patient presents with  . Allergic Reaction    HPI Cheryl Williams is a 54 y.o. female.   Patient reports for possible allergic reaction to medication.  She reports few weeks ago to the [place] and some doses of Bactrim for possible UTI and had a skin rash on her body and face.  This resolved after stopping the medication.  She reports when she returned she got a prescription for trimethoprim from a primary care provider which she started the day and after taking 1 tablet she began to have allergic reaction like rash.  Very itchy.  She reports she has been having UTI symptoms and gets recurrent UTIs.  She reports she has never had medication allergies before.  Denies any tongue swelling or difficulty breathing.   She reports she is having painful urination, frequency and urgency currently.  Denies fever chills.       Past Medical History:  Diagnosis Date  . Anemia   . Anxiety    claustrphobia  . Chicken pox   . GERD (gastroesophageal reflux disease)   . IBS (irritable bowel syndrome)   . Lupus (HCC)   . Raynaud disease   . Sjogrens syndrome [facility])     Patient Active Problem List   Diagnosis Date Noted  . Generalized anxiety disorder 10/03/2014  . GERD (gastroesophageal reflux disease) 10/03/2014  . 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 Insomnia 04/19/2007  . Lupus (systemic lupus erythematosus) (HCC) 04/18/2004  . Raynaud's disease 04/18/2004  . Sjogren's disease (HCC) 04/18/2004    Past Surgical History:  Procedure Laterality Date  . BUNIONECTOMY Left    [facility]  . CHOLECYSTECTOMY  2006    OB History   No obstetric history on file.      Home Medications    Prior to Admission medications   Medication Sig Start Date End Date Taking? Authorizing Provider  ALPRAZolam Prudy Feeler) 0.5 MG tablet Take 1 tablet (0.5 mg total) by  mouth daily as needed for anxiety. 10/15/19   Lorre Munroe, NP  Biotin 5000 MCG CAPS Take by mouth daily.    [provider]  cephALEXin (KEFLEX) 500 MG capsule Take 1 capsule (500 mg total) by mouth 2 (two) times daily for 5 days. 10/24/19 10/29/19  Marshea Wisher, Veryl Speak, PA-C  hydroxychloroquine (PLAQUENIL) 200 MG tablet Take by mouth daily.    [provider]  ibuprofen (ADVIL,MOTRIN) 600 MG tablet TAKE 1 TABLET BY MOUTH 3 TIMES DAILY. 02/21/17   Ralene Cork, DO  propranolol (INDERAL) 10 MG tablet TAKE 1 TABLET BY MOUTH DAILY AS NEEDED. 01/31/19   Lorre Munroe, NP  sulfamethoxazole-trimethoprim (BACTRIM) 400-80 MG tablet Take 1 tablet by mouth 2 (two) times daily. 10/11/19   Lorre Munroe, NP  Suvorexant (BELSOMRA) 10 MG TABS Take 1 tablet by mouth at bedtime. 10/24/19   Lorre Munroe, NP  trimethoprim (TRIMPEX) 100 MG tablet 1 tab PO daily prn after sexual intercourse 10/24/19   Lorre Munroe, NP  valACYclovir (VALTREX) 1000 MG tablet Take 0.5 tablets (500 mg total) by mouth 2 (two) times daily. Times 3 days for recurrent outbreaks and 1 tab daily for suppressive therapy. 04/30/19   Lorre Munroe, NP    Family History Family History  Problem Relation Age of Onset  . Hyperlipidemia Mother   . Hypertension  Mother   . Deafness Mother   . Colon polyps Mother   . Alcohol abuse Father   . Deafness Father   . Drug abuse Sister   . Hypertension Sister   . Drug abuse Brother   . Rheum arthritis Maternal Aunt   . Lupus Maternal Aunt   . Rheum arthritis Maternal Grandmother   . Hypertension Sister   . Colon cancer Neg Hx   . Esophageal cancer Neg Hx   . Rectal cancer Neg Hx   . Stomach cancer Neg Hx     Social History Social History   Tobacco Use  . Smoking status: Never Smoker  . Smokeless tobacco: Never Used  Substance Use Topics  . Alcohol use: Yes    Alcohol/week: 0.0 standard drinks    Comment: occasional--moderate  . Drug use: No     Allergies     Penicillins, Bactrim [sulfamethoxazole-trimethoprim], Imuran [azathioprine], and Trimethoprim   Review of Systems Review of Systems   Physical Exam Triage Vital Signs ED Triage Vitals  Enc Vitals Group     BP 10/24/19 1926 123/81     Pulse Rate 10/24/19 1926 77     Resp 10/24/19 1926 16     Temp 10/24/19 1926 98.4 F (36.9 C)     Temp Source 10/24/19 1926 Oral     SpO2 10/24/19 1926 98 %     Weight 10/24/19 1933 150 lb (68 kg)     Height 10/24/19 1933 5\' 8"  (1.727 m)     Head Circumference --      Peak Flow --      Pain Score 10/24/19 1932 0     Pain Loc --      Pain Edu? --      Excl. in GC? --    No data found.  Updated Vital Signs BP 123/81 (BP Location: Left Arm)   Pulse 77   Temp 98.4 F (36.9 C) (Oral)   Resp 16   Ht 5\' 8"  (1.727 m)   Wt 150 lb (68 kg)   SpO2 98%   BMI 22.81 kg/m   Visual Acuity Right Eye Distance:   Left Eye Distance:   Bilateral Distance:    Right Eye Near:   Left Eye Near:    Bilateral Near:     Physical Exam Vitals and nursing note reviewed.  Constitutional:      General: She is not in acute distress.    Appearance: She is well-developed. She is not ill-appearing.  HENT:     Head: Normocephalic and atraumatic.     Mouth/Throat:     Mouth: Mucous membranes are moist.     Comments: There is no oropharyngeal swelling. Eyes:     Conjunctiva/sclera: Conjunctivae normal.  Cardiovascular:     Rate and Rhythm: Normal rate and regular rhythm.     Heart sounds: No murmur heard.   Pulmonary:     Effort: Pulmonary effort is normal. No respiratory distress.     Breath sounds: Normal breath sounds.  Abdominal:     Palpations: Abdomen is soft.     Tenderness: There is no abdominal tenderness.  Musculoskeletal:     Cervical back: Neck supple.  Skin:    General: Skin is warm and dry.     Comments: 1 or 2 remaining urticarial-like lesions, appears mostly resolved.  Neurological:     Mental Status: She is alert.      UC  Treatments / Results  Labs (all labs ordered are  listed, but only abnormal results are displayed) Labs Reviewed  POCT URINALYSIS DIP (DEVICE) - Abnormal; Notable for the following components:      Result Value   Hgb urine dipstick TRACE (*)    Leukocytes,Ua SMALL (*)    All other components within normal limits  URINE CULTURE    EKG   Radiology No results found.  Procedures Procedures (including critical care time)  Medications Ordered in UC Medications - No data to display  Initial Impression / Assessment and Plan / UC Course  I have reviewed the triage vital signs and the nursing notes.  Pertinent labs & imaging results that were available during my care of the patient were reviewed by me and considered in my medical decision making (see chart for details).     #Allergic reaction to medication #Hives #Acute cystitis Patient a 54 year old presenting with likely reaction to medication..  She has both reaction of Bactrim as well as trimethoprim.  Appears mostly resolving currently.  UA with leukocytes given consistent symptoms we will send Keflex and have her start this tomorrow with a culture pending.  She reports she has tolerated Keflex well in May.  Recommend Benadryl at night and Zyrtec during the day.  Discussed that she needs to follow-up with her primary care and is no longer take Bactrim or trimethoprim products.  Patient verbalized agreement understanding plan of care. #Final Clinical Impressions(s) / UC Diagnoses   Final diagnoses:  Allergic reaction, initial encounter  Hives  Acute cystitis without hematuria     Discharge Instructions     Take benadryl tonight Take zyrtex tomorrow  We sent a culture of your urine, we will call if we need to change anything  Wait until current rash resolves prior to new medicine -start keflex tomorrow  2 times a day  Follow up with your PCP      ED Prescriptions    Medication Sig Dispense Auth. Provider    cephALEXin (KEFLEX) 500 MG capsule Take 1 capsule (500 mg total) by mouth 2 (two) times daily for 5 days. 10 capsule Dinnis Rog, Veryl Speak, PA-C     PDMP not reviewed this encounter.   Hermelinda Medicus, PA-C 10/25/19 [phone]

## 2019-10-24 NOTE — ED Triage Notes (Addendum)
Pt was taking bactrim for 4 days then she developed a rash and facial swelling, face started peeling. Pt states she got better, then took trimethoprim 1700 today and broke out in a rash and itching. Pt noticed she has hives on arms bilat and face. Pt has non labored breathing.

## 2019-10-25 MED FILL — CEPHALEXIN 500 MG CAPSULE: 500 | 5 days supply | Qty: 10 | Fill #0

## 2019-10-27 LAB — URINE CULTURE: Culture: 20000 — AB

## 2019-11-15 ENCOUNTER — Encounter: Payer: Self-pay | Admitting: Internal Medicine

## 2019-11-18 MED ORDER — CIPROFLOXACIN HCL 250 MG PO TABS
ORAL_TABLET | ORAL | 0 refills | Status: DC
Start: 2019-11-18 — End: 2020-05-26

## 2019-11-18 MED FILL — CIPROFLOXACIN HCL 250 MG TA: 250 | 30 days supply | Qty: 30 | Fill #0

## 2019-11-25 ENCOUNTER — Other Ambulatory Visit: Payer: Self-pay | Admitting: Internal Medicine

## 2019-11-25 ENCOUNTER — Ambulatory Visit: Payer: No Typology Code available for payment source | Admitting: Internal Medicine

## 2019-11-25 MED ORDER — BELSOMRA 10 MG PO TABS: 1.0000 | ORAL_TABLET | Freq: Every day | ORAL | 0 refills | Status: DC

## 2019-11-25 MED FILL — BELSOMRA 10 MG TABLET: 10 | 30 days supply | Qty: 30 | Fill #0

## 2019-11-25 NOTE — Telephone Encounter (Signed)
Last filled 10/24/2019... please advise °

## 2019-12-24 ENCOUNTER — Other Ambulatory Visit: Payer: Self-pay | Admitting: Internal Medicine

## 2019-12-25 MED ORDER — BELSOMRA 10 MG PO TABS: 1.0000 | ORAL_TABLET | Freq: Every day | ORAL | 0 refills | Status: DC

## 2019-12-25 MED FILL — BELSOMRA 10 MG TABLET: 10 | 30 days supply | Qty: 30 | Fill #0

## 2019-12-25 NOTE — Telephone Encounter (Signed)
Last filled 11/25/2019... please advise  

## 2020-01-06 ENCOUNTER — Ambulatory Visit (INDEPENDENT_AMBULATORY_CARE_PROVIDER_SITE_OTHER): Payer: No Typology Code available for payment source | Admitting: Internal Medicine

## 2020-01-06 ENCOUNTER — Other Ambulatory Visit: Payer: Self-pay | Admitting: Internal Medicine

## 2020-01-06 ENCOUNTER — Other Ambulatory Visit: Payer: Self-pay

## 2020-01-06 ENCOUNTER — Encounter: Payer: Self-pay | Admitting: Internal Medicine

## 2020-01-06 VITALS — BP 112/74 | HR 70 | Temp 97.9°F | Resp 98 | Wt 149.0 lb

## 2020-01-06 DIAGNOSIS — R232 Flushing: Secondary | ICD-10-CM | POA: Diagnosis not present

## 2020-01-06 DIAGNOSIS — Z23 Encounter for immunization: Secondary | ICD-10-CM

## 2020-01-06 DIAGNOSIS — N390 Urinary tract infection, site not specified: Secondary | ICD-10-CM

## 2020-01-06 DIAGNOSIS — N951 Menopausal and female climacteric states: Secondary | ICD-10-CM

## 2020-01-06 MED ORDER — ESTRADIOL 0.1 MG/GM VA CREA
1.0000 | TOPICAL_CREAM | VAGINAL | 12 refills | Status: DC
Start: 2020-01-06 — End: 2020-01-06

## 2020-01-06 NOTE — Progress Notes (Signed)
Subjective:    Patient ID: Cheryl Williams, female    DOB: 07-03-1965, 54 y.o.   MRN: [id number]  HPI  Patient presents the clinic today to follow-up postcoital UTIs. She reports cloudy urine, vaginal burning that typically occurs after intercourse. She denies pelvic pain, back pain, or pain with intercourse. She intermittently has some vaginal discharge but this clears up on it's own. She reports this past month, she had to take the Cipro for 3 days in a row to reduce her symptoms. She reports irregular periods and hot flashes. She is not taking anything for menopausal symptoms but is considering trying Black Cohosh OTC.  She has not seen a urologist and does not follow with GYN.  She would like a flu shot today.  Review of Systems      Past Medical History:  Diagnosis Date  . Anemia   . Anxiety    claustrphobia  . Chicken pox   . GERD (gastroesophageal reflux disease)   . IBS (irritable bowel syndrome)   . Lupus (HCC)   . Raynaud disease   . Sjogrens syndrome (HCC)     Current Outpatient Medications  Medication Sig Dispense Refill  . ALPRAZolam (XANAX) 0.5 MG tablet Take 1 tablet (0.5 mg total) by mouth daily as needed for anxiety. 20 tablet 0  . Biotin 5000 MCG CAPS Take by mouth daily.    . ciprofloxacin (CIPRO) 250 MG tablet 1 tab PO x 1 after intercourse 30 tablet 0  . hydroxychloroquine (PLAQUENIL) 200 MG tablet Take by mouth daily.    Marland Kitchen ibuprofen (ADVIL,MOTRIN) 600 MG tablet TAKE 1 TABLET BY MOUTH 3 TIMES DAILY. 60 tablet 5  . propranolol (INDERAL) 10 MG tablet TAKE 1 TABLET BY MOUTH DAILY AS NEEDED. 30 tablet 1  . sulfamethoxazole-trimethoprim (BACTRIM) 400-80 MG tablet Take 1 tablet by mouth 2 (two) times daily. 10 tablet 0  . Suvorexant (BELSOMRA) 10 MG TABS Take 1 tablet by mouth at bedtime. 30 tablet 0  . trimethoprim (TRIMPEX) 100 MG tablet 1 tab PO daily prn after sexual intercourse 30 tablet 0  . valACYclovir (VALTREX) 1000 MG tablet Take 0.5 tablets  (500 mg total) by mouth 2 (two) times daily. Times 3 days for recurrent outbreaks and 1 tab daily for suppressive therapy. 30 tablet 0   No current facility-administered medications for this visit.    Allergies  Allergen Reactions  . Penicillins      Rash and hives at age 65  . Bactrim [Sulfamethoxazole-Trimethoprim] Hives  . Imuran [Azathioprine] Other (See Comments)    Mental hallucinations  . Trimethoprim Hives    Family History  Problem Relation Age of Onset  . Hyperlipidemia Mother   . Hypertension Mother   . Deafness Mother   . Colon polyps Mother   . Alcohol abuse Father   . Deafness Father   . Drug abuse Sister   . Hypertension Sister   . Drug abuse Brother   . Rheum arthritis Maternal Aunt   . Lupus Maternal Aunt   . Rheum arthritis Maternal Grandmother   . Hypertension Sister   . Colon cancer Neg Hx   . Esophageal cancer Neg Hx   . Rectal cancer Neg Hx   . Stomach cancer Neg Hx     Social History   Socioeconomic History  . Marital status: Divorced    Spouse name: Not on file  . Number of children: Not on file  . Years of education: Not on file  . Highest  education level: Not on file  Occupational History  . Not on file  Tobacco Use  . Smoking status: Never Smoker  . Smokeless tobacco: Never Used  Substance and Sexual Activity  . Alcohol use: Yes    Alcohol/week: 0.0 standard drinks    Comment: occasional--moderate  . Drug use: No  . Sexual activity: Yes  Other Topics Concern  . Not on file  Social History Narrative  . Not on file   Social Determinants of Health   Financial Resource Strain:   . Difficulty of Paying Living Expenses: Not on file  Food Insecurity:   . Worried About Programme researcher, broadcasting/film/video in the Last Year: Not on file  . Ran Out of Food in the Last Year: Not on file  Transportation Needs:   . Lack of Transportation (Medical): Not on file  . Lack of Transportation (Non-Medical): Not on file  Physical Activity:   . Days of  Exercise per Week: Not on file  . Minutes of Exercise per Session: Not on file  Stress:   . Feeling of Stress : Not on file  Social Connections:   . Frequency of Communication with Friends and Family: Not on file  . Frequency of Social Gatherings with Friends and Family: Not on file  . Attends Religious Services: Not on file  . Active Member of Clubs or Organizations: Not on file  . Attends Banker Meetings: Not on file  . Marital Status: Not on file  Intimate Partner Violence:   . Fear of Current or Ex-Partner: Not on file  . Emotionally Abused: Not on file  . Physically Abused: Not on file  . Sexually Abused: Not on file     Constitutional: Denies fever, malaise, fatigue, headache or abrupt weight changes.  Respiratory: Denies difficulty breathing, shortness of breath, cough or sputum production.   Cardiovascular: Denies chest pain, chest tightness, palpitations or swelling in the hands or feet.  Gastrointestinal: Denies abdominal pain, bloating, constipation, diarrhea or blood in the stool.  GU: Pt reports intermittent cloudy urine, vaginal discharge, burning sensation and irregular periods. Denies urgency, frequency, pain with urination, blood in urine, odor or discharge. Neurological: Pt reports hot flashes. Denies dizziness, difficulty with memory, difficulty with speech or problems with balance and coordination.    No other specific complaints in a complete review of systems (except as listed in HPI above).  Objective:   Physical Exam BP 112/74   Pulse 70   Temp 97.9 F (36.6 C) (Temporal)   Resp (!) 98   Wt 149 lb (67.6 kg)   BMI 22.66 kg/m   Wt Readings from Last 3 Encounters:  10/24/19 150 lb (68 kg)  10/09/19 147 lb (66.7 kg)  04/30/19 150 lb (68 kg)    General: Appears her stated age, well developed, well nourished in NAD. Cardiovascular: Normal rate. Pulmonary/Chest: Normal effort. Neurological: Alert and oriented.    BMET    Component  Value Date/Time   NA 137 04/30/2019 1507   K 4.0 04/30/2019 1507   CL 102 04/30/2019 1507   CO2 27 04/30/2019 1507   GLUCOSE 83 04/30/2019 1507   BUN 13 04/30/2019 1507   CREATININE 0.91 04/30/2019 1507   CREATININE 1.09 02/03/2017 1650   CALCIUM 9.2 04/30/2019 1507    Lipid Panel     Component Value Date/Time   CHOL 184 04/30/2019 1507   TRIG 84.0 04/30/2019 1507   HDL 65.10 04/30/2019 1507   CHOLHDL 3 04/30/2019 1507  VLDL 16.8 04/30/2019 1507   LDLCALC 102 (H) 04/30/2019 1507   LDLCALC 107 (H) 02/03/2017 1650    CBC    Component Value Date/Time   WBC 4.8 04/30/2019 1507   RBC 3.64 (L) 04/30/2019 1507   HGB 10.8 (L) 04/30/2019 1507   HCT 32.5 (L) 04/30/2019 1507   PLT 252.0 04/30/2019 1507   MCV 89.2 04/30/2019 1507   MCH 29.1 02/03/2017 1650   MCHC 33.3 04/30/2019 1507   RDW 13.8 04/30/2019 1507    Hgb A1C No results found for: HGBA1C          Assessment & Plan:   Post Coital UTI, Perimenopausal, Hot Flashes:  Will continue Cipro post coital Encouraged her to void after intercourse Encouraged her to drink plenty of water Will add Estrace cream 3 x week Can try Black Cohosh OTC for hot flashes If no improvement, will consider referral to urology vs gynecology  Return precautions discussed    Nicki Reaper, NP This visit occurred during the SARS-CoV-2 public health emergency.  Safety protocols were in place, including screening questions prior to the visit, additional usage of staff PPE, and extensive cleaning of exam room while observing appropriate contact time as indicated for disinfecting solutions.

## 2020-01-06 NOTE — Patient Instructions (Signed)
Atrophic Vaginitis Atrophic vaginitis is a condition in which the tissues that line the vagina become dry and thin. This condition occurs in women who have stopped having their period. It is caused by a drop in a female hormone (estrogen). This hormone helps:  To keep the vagina moist.  To make a clear fluid. This clear fluid helps: ? To make the vagina ready for sex. ? To protect the vagina from infection. If the lining of the vagina is dry and thin, it may cause irritation, burning, or itchiness. It may also:  Make sex painful.  Make an exam of your vagina painful.  Cause bleeding.  Make you lose interest in sex.  Cause a burning feeling when you pee (urinate).  Cause a brown or yellow fluid to come from your vagina. Some women do not have symptoms. Follow these instructions at home: Medicines  Take over-the-counter and prescription medicines only as told by your doctor.  Do not use herbs or other medicines unless your doctor says it is okay.  Use medicines for for dryness. These include: ? Oils to make the vagina soft. ? Creams. ? Moisturizers. General instructions  Do not douche.  Do not use products that can make your vagina dry. These include: ? Scented sprays. ? Scented tampons. ? Scented soaps.  Sex can help increase blood flow and soften the tissue in the vagina. If it hurts to have sex: ? Tell your partner. ? Use products to make sex more comfortable. Use these only as told by your doctor. Contact a doctor if you:  Have discharge from the vagina that is different than usual.  Have a bad smell coming from your vagina.  Have new symptoms.  Do not get better.  Get worse. Summary  Atrophic vaginitis is a condition in which the lining of the vagina becomes dry and thin.  This condition affects women who have stopped having their periods.  Treatment may include using products that help make the vagina soft.  Call a doctor if do not get better with  treatment. This information is not intended to replace advice given to you by your health care provider. Make sure you discuss any questions you have with your health care provider. Document Revised: 04/17/2017 Document Reviewed: 04/17/2017 Elsevier Patient Education  2020 Elsevier Inc.  

## 2020-01-07 ENCOUNTER — Ambulatory Visit: Payer: No Typology Code available for payment source

## 2020-01-21 ENCOUNTER — Ambulatory Visit: Payer: No Typology Code available for payment source

## 2020-01-23 ENCOUNTER — Other Ambulatory Visit: Payer: Self-pay | Admitting: Internal Medicine

## 2020-01-23 ENCOUNTER — Encounter: Payer: Self-pay | Admitting: Internal Medicine

## 2020-01-23 MED ORDER — BELSOMRA 10 MG PO TABS: 1.0000 | ORAL_TABLET | Freq: Every day | ORAL | 0 refills | Status: DC

## 2020-01-23 MED FILL — ESTRADIOL 0.1 MG/GM CREA: 0.1 | 25 days supply | Qty: 43 | Fill #0

## 2020-01-23 MED FILL — BELSOMRA 10 MG TABLET: 10 | 30 days supply | Qty: 30 | Fill #0

## 2020-01-23 NOTE — Telephone Encounter (Signed)
Last filled 12/25/2019.... please advise  

## 2020-02-07 ENCOUNTER — Other Ambulatory Visit (HOSPITAL_COMMUNITY): Payer: Self-pay | Admitting: Rheumatology

## 2020-02-07 MED FILL — HYDROXYCHLOROQUINE SULFATE: 200 | 90 days supply | Qty: 90 | Fill #0

## 2020-02-26 ENCOUNTER — Other Ambulatory Visit: Payer: Self-pay | Admitting: Internal Medicine

## 2020-02-26 NOTE — Telephone Encounter (Signed)
Last filled 01/23/2020.... please advise °

## 2020-02-27 ENCOUNTER — Other Ambulatory Visit: Payer: Self-pay | Admitting: Internal Medicine

## 2020-02-27 MED ORDER — BELSOMRA 10 MG PO TABS: 1.0000 | ORAL_TABLET | Freq: Every day | ORAL | 0 refills | Status: DC

## 2020-02-27 MED FILL — BELSOMRA 10 MG TABLET: 10 | 30 days supply | Qty: 30 | Fill #0

## 2020-03-03 MED FILL — ESTRADIOL 0.1 MG/GM CREA: 0.1 | 25 days supply | Qty: 43 | Fill #1

## 2020-03-04 MED FILL — HYDROXYCHLOROQUINE SULFATE: 200 | 90 days supply | Qty: 90 | Fill #0

## 2020-03-23 ENCOUNTER — Other Ambulatory Visit: Payer: Self-pay | Admitting: Internal Medicine

## 2020-03-23 DIAGNOSIS — A609 Anogenital herpesviral infection, unspecified: Secondary | ICD-10-CM

## 2020-03-24 ENCOUNTER — Other Ambulatory Visit: Payer: Self-pay | Admitting: Internal Medicine

## 2020-03-24 MED ORDER — VALACYCLOVIR HCL 1 G PO TABS: 500.0000 mg | ORAL_TABLET | Freq: Two times a day (BID) | ORAL | 0 refills | Status: DC

## 2020-03-24 MED ORDER — BELSOMRA 10 MG PO TABS: 1.0000 | ORAL_TABLET | Freq: Every day | ORAL | 0 refills | Status: DC

## 2020-03-24 MED FILL — valACYclovir HCL 1 GM TABS: 1 | 30 days supply | Qty: 30 | Fill #0

## 2020-03-24 NOTE — Telephone Encounter (Signed)
Belsomra last filled 02/27/2020... note [facility] on or after 03/27/2020 as she uses employee pharmacy

## 2020-04-27 MED FILL — BELSOMRA 10 MG TABLET: 10 | 30 days supply | Qty: 30 | Fill #0

## 2020-05-14 ENCOUNTER — Other Ambulatory Visit: Payer: Self-pay | Admitting: Internal Medicine

## 2020-05-14 DIAGNOSIS — Z1231 Encounter for screening mammogram for malignant neoplasm of breast: Secondary | ICD-10-CM

## 2020-05-22 ENCOUNTER — Encounter: Payer: No Typology Code available for payment source | Admitting: Internal Medicine

## 2020-05-25 ENCOUNTER — Other Ambulatory Visit: Payer: Self-pay | Admitting: Internal Medicine

## 2020-05-26 ENCOUNTER — Ambulatory Visit (INDEPENDENT_AMBULATORY_CARE_PROVIDER_SITE_OTHER): Payer: No Typology Code available for payment source | Admitting: Internal Medicine

## 2020-05-26 ENCOUNTER — Other Ambulatory Visit: Payer: Self-pay

## 2020-05-26 ENCOUNTER — Other Ambulatory Visit: Payer: Self-pay | Admitting: Internal Medicine

## 2020-05-26 ENCOUNTER — Encounter: Payer: Self-pay | Admitting: Internal Medicine

## 2020-05-26 VITALS — BP 112/70 | HR 69 | Temp 97.5°F | Ht 67.0 in | Wt 153.0 lb

## 2020-05-26 DIAGNOSIS — Z0001 Encounter for general adult medical examination with abnormal findings: Secondary | ICD-10-CM

## 2020-05-26 DIAGNOSIS — A609 Anogenital herpesviral infection, unspecified: Secondary | ICD-10-CM

## 2020-05-26 DIAGNOSIS — B009 Herpesviral infection, unspecified: Secondary | ICD-10-CM

## 2020-05-26 DIAGNOSIS — F411 Generalized anxiety disorder: Secondary | ICD-10-CM

## 2020-05-26 DIAGNOSIS — I7300 Raynaud's syndrome without gangrene: Secondary | ICD-10-CM | POA: Diagnosis not present

## 2020-05-26 DIAGNOSIS — K219 Gastro-esophageal reflux disease without esophagitis: Secondary | ICD-10-CM | POA: Diagnosis not present

## 2020-05-26 DIAGNOSIS — N390 Urinary tract infection, site not specified: Secondary | ICD-10-CM

## 2020-05-26 DIAGNOSIS — M329 Systemic lupus erythematosus, unspecified: Secondary | ICD-10-CM

## 2020-05-26 DIAGNOSIS — Z1159 Encounter for screening for other viral diseases: Secondary | ICD-10-CM

## 2020-05-26 DIAGNOSIS — M3500 Sicca syndrome, unspecified: Secondary | ICD-10-CM

## 2020-05-26 DIAGNOSIS — F5104 Psychophysiologic insomnia: Secondary | ICD-10-CM

## 2020-05-26 LAB — COMPREHENSIVE METABOLIC PANEL
ALT: 13 U/L (ref 0–35)
AST: 22 U/L (ref 0–37)
Albumin: 4.4 g/dL (ref 3.5–5.2)
Alkaline Phosphatase: 46 U/L (ref 39–117)
BUN: 14 mg/dL (ref 6–23)
CO2: 32 mEq/L (ref 19–32)
Calcium: 9.7 mg/dL (ref 8.4–10.5)
Chloride: 102 mEq/L (ref 96–112)
Creatinine, Ser: 0.81 mg/dL (ref 0.40–1.20)
GFR: 83.38 mL/min (ref >60.00)
Glucose, Bld: 82 mg/dL (ref 70–99)
Potassium: 3.7 mEq/L (ref 3.5–5.1)
Sodium: 139 mEq/L (ref 135–145)
Total Bilirubin: 0.5 mg/dL (ref 0.2–1.2)
Total Protein: 7.7 g/dL (ref 6.0–8.3)

## 2020-05-26 LAB — LIPID PANEL
Cholesterol: 223 mg/dL — ABNORMAL HIGH (ref 0–200)
HDL: 67.4 mg/dL (ref >39.00)
LDL Cholesterol: 141 mg/dL — ABNORMAL HIGH (ref 0–99)
NonHDL: 155.84
Total CHOL/HDL Ratio: 3
Triglycerides: 72 mg/dL (ref 0.0–149.0)
VLDL: 14.4 mg/dL (ref 0.0–40.0)

## 2020-05-26 LAB — CBC
HCT: 32.1 % — ABNORMAL LOW (ref 36.0–46.0)
Hemoglobin: 10.5 g/dL — ABNORMAL LOW (ref 12.0–15.0)
MCHC: 32.9 g/dL (ref 30.0–36.0)
MCV: 88.8 fl (ref 78.0–100.0)
Platelets: 214 10*3/uL (ref 150.0–400.0)
RBC: 3.61 Mil/uL — ABNORMAL LOW (ref 3.87–5.11)
RDW: 13.9 % (ref 11.5–15.5)
WBC: 3 10*3/uL — ABNORMAL LOW (ref 4.0–10.5)

## 2020-05-26 MED ORDER — PROPRANOLOL HCL 10 MG PO TABS: 10.0000 mg | ORAL_TABLET | Freq: Every day | ORAL | 1 refills | Status: DC | PRN

## 2020-05-26 MED ORDER — BELSOMRA 10 MG PO TABS: 1.0000 | ORAL_TABLET | Freq: Every day | ORAL | 0 refills | Status: DC

## 2020-05-26 MED ORDER — CIPROFLOXACIN HCL 250 MG PO TABS: ORAL_TABLET | ORAL | 0 refills | Status: DC

## 2020-05-26 MED ORDER — ALPRAZOLAM 0.5 MG PO TABS: 0.5000 mg | ORAL_TABLET | Freq: Every day | ORAL | 0 refills | Status: DC | PRN

## 2020-05-26 MED ORDER — VALACYCLOVIR HCL 1 G PO TABS: 500.0000 mg | ORAL_TABLET | Freq: Two times a day (BID) | ORAL | 0 refills | Status: DC

## 2020-05-26 MED FILL — ALPRAZolam 0.5 MG TABS: 0.5 | 20 days supply | Qty: 20 | Fill #0

## 2020-05-26 MED FILL — BELSOMRA 10 MG TABLET: 10 | 30 days supply | Qty: 30 | Fill #0

## 2020-05-26 MED FILL — PROPRANOLOL HCL 10 MG TAB: 10 | 30 days supply | Qty: 30 | Fill #0

## 2020-05-26 MED FILL — CIPROFLOXACIN HCL 250 MG TA: 250 | 30 days supply | Qty: 30 | Fill #0

## 2020-05-26 MED FILL — valACYclovir HCL 1 GM TABS: 1 | 30 days supply | Qty: 30 | Fill #0

## 2020-05-26 NOTE — Assessment & Plan Note (Signed)
No issues off meds We will monitor 

## 2020-05-26 NOTE — Progress Notes (Signed)
Subjective:    Pat/ient ID: Cheryl Williams, female    DOB: Aug 21, 1965, 55 y.o.   MRN: [id number]  HPI   Patient presents to the clinic today for her annual exam.  She is also due to follow-up chronic conditions.  Lupus/Sjogren's: Managed on Plaquenil.  She follows with rheumatology.  Anxiety: Triggered by flying in public speaking.  She takes Xanax and/or Propanolol as needed.  She does not see a therapist.  She denies depression, SI/HI.  GERD: Intermittent, managed with Tums OTC with good relief of symptoms.  There is no upper GI on file.  Insomnia: She has trouble falling asleep.  She takes Belsomra as needed with good results.  There is no sleep study on file.  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  She takes Valtrex as needed with good relief of symptoms.  Raynaud's: No issues off medications.  Postcoital UTI: Managed with Cipro after intercourse. Flu: 12/2019 Tetanus: < 5 years ago Covid: Pfizer x 3 Pap smear: 01/2017 Mammogram: 04/2019 Colon screening: 04/2018 Vision screen: biannually Dentist: biannually  Diet: Exercise:  Review of Systems      Past Medical History:  Diagnosis Date  . Anemia   . Anxiety    claustrphobia  . Chicken pox   . GERD (gastroesophageal reflux disease)   . IBS (irritable bowel syndrome)   . Lupus (HCC)   . Raynaud disease   . Sjogrens syndrome (HCC)     Current Outpatient Medications  Medication Sig Dispense Refill  . ALPRAZolam (XANAX) 0.5 MG tablet Take 1 tablet (0.5 mg total) by mouth daily as needed for anxiety. 20 tablet 0  . Biotin 5000 MCG CAPS Take by mouth daily.    . ciprofloxacin (CIPRO) 250 MG tablet 1 tab PO x 1 after intercourse 30 tablet 0  . estradiol (ESTRACE VAGINAL) 0.1 MG/GM vaginal cream Place 1 Applicatorful vaginally 3 (three) times a week. 42.5 g 12  . hydroxychloroquine (PLAQUENIL) 200 MG tablet Take by mouth daily.    Marland Kitchen ibuprofen (ADVIL,MOTRIN) 600 MG tablet TAKE 1 TABLET BY MOUTH 3 TIMES DAILY. 60  tablet 5  . propranolol (INDERAL) 10 MG tablet TAKE 1 TABLET BY MOUTH DAILY AS NEEDED. 30 tablet 1  . Suvorexant (BELSOMRA) 10 MG TABS Take 1 tablet by mouth at bedtime. 30 tablet 0  . valACYclovir (VALTREX) 1000 MG tablet Take 0.5 tablets (500 mg total) by mouth 2 (two) times daily. Times 3 days for recurrent outbreaks and 1 tab daily for suppressive therapy. 30 tablet 0   No current facility-administered medications for this visit.    Allergies  Allergen Reactions  . Penicillins      Rash and hives at age 5  . Bactrim [Sulfamethoxazole-Trimethoprim] Hives  . Imuran [Azathioprine] Other (See Comments)    Mental hallucinations  . Trimethoprim Hives    Family History  Problem Relation Age of Onset  . Hyperlipidemia Mother   . Hypertension Mother   . Deafness Mother   . Colon polyps Mother   . Alcohol abuse Father   . Deafness Father   . Drug abuse Sister   . Hypertension Sister   . Drug abuse Brother   . Rheum arthritis Maternal Aunt   . Lupus Maternal Aunt   . Rheum arthritis Maternal Grandmother   . Hypertension Sister   . Colon cancer Neg Hx   . Esophageal cancer Neg Hx   . Rectal cancer Neg Hx   . Stomach cancer Neg Hx     Social  History   Socioeconomic History  . Marital status: Divorced    Spouse name: Not on file  . Number of children: Not on file  . Years of education: Not on file  . Highest education level: Not on file  Occupational History  . Not on file  Tobacco Use  . Smoking status: Never Smoker  . Smokeless tobacco: Never Used  Substance and Sexual Activity  . Alcohol use: Yes    Alcohol/week: 0.0 standard drinks    Comment: occasional--moderate  . Drug use: No  . Sexual activity: Yes  Other Topics Concern  . Not on file  Social History Narrative  . Not on file   Social Determinants of Health   Financial Resource Strain: Not on file  Food Insecurity: Not on file  Transportation Needs: Not on file  Physical Activity: Not on file   Stress: Not on file  Social Connections: Not on file  Intimate Partner Violence: Not on file     Constitutional: Denies fever, malaise, fatigue, headache or abrupt weight changes.  HEENT: Denies eye pain, eye redness, ear pain, ringing in the ears, wax buildup, runny nose, nasal congestion, bloody nose, or sore throat. Respiratory: Denies difficulty breathing, shortness of breath, cough or sputum production.   Cardiovascular: Denies chest pain, chest tightness, palpitations or swelling in the hands or feet.  Gastrointestinal: Pt reports intermittent reflux. Denies abdominal pain, bloating, constipation, diarrhea or blood in the stool.  GU: Denies urgency, frequency, pain with urination, burning sensation, blood in urine, odor or discharge. Musculoskeletal: Denies decrease in range of motion, difficulty with gait, muscle pain or joint pain and swelling.  Skin: Denies redness, rashes, lesions or ulcercations.  Neurological: Pt has a history of insomnia. Denies dizziness, difficulty with memory, difficulty with speech or problems with balance and coordination.  Psych: Pt reports intermittent anxiety. Denies depression, SI/HI.  No other specific complaints in a complete review of systems (except as listed in HPI above).  Objective:   Physical Exam  BP 112/70   Pulse 69   Temp (!) 97.5 F (36.4 C) (Temporal)   Ht 5\' 7"  (1.702 m)   Wt 153 lb (69.4 kg)   LMP 06/18/2019   SpO2 98%   BMI 23.96 kg/m   Wt Readings from Last 3 Encounters:  01/06/20 149 lb (67.6 kg)  10/24/19 150 lb (68 kg)  10/09/19 147 lb (66.7 kg)    General: Appears her stated age, well developed, well nourished in NAD. Skin: Warm, dry and intact. No rashes noted. HEENT: Head: normal shape and size; Eyes: sclera white, no icterus, conjunctiva pink, PERRLA and EOMs intact; Neck:  Neck supple, trachea midline. No masses, lumps or thyromegaly present.  Cardiovascular: Normal rate and rhythm. S1,S2 noted.  No murmur,  rubs or gallops noted. No JVD or BLE edema. No carotid bruits noted. Pulmonary/Chest: Normal effort and positive vesicular breath sounds. No respiratory distress. No wheezes, rales or ronchi noted.  Abdomen: Soft and nontender. Normal bowel sounds. No distention or masses noted. Liver, spleen and kidneys non palpable. Musculoskeletal: Strength 5/5 BUE/BLE. No difficulty with gait.  Neurological: Alert and oriented. Cranial nerves II-XII grossly intact. Coordination normal.  Psychiatric: Mood and affect normal. Behavior is normal. Judgment and thought content normal.    BMET    Component Value Date/Time   NA 137 04/30/2019 1507   K 4.0 04/30/2019 1507   CL 102 04/30/2019 1507   CO2 27 04/30/2019 1507   GLUCOSE 83 04/30/2019 1507  BUN 13 04/30/2019 1507   CREATININE 0.91 04/30/2019 1507   CREATININE 1.09 02/03/2017 1650   CALCIUM 9.2 04/30/2019 1507    Lipid Panel     Component Value Date/Time   CHOL 184 04/30/2019 1507   TRIG 84.0 04/30/2019 1507   HDL 65.10 04/30/2019 1507   CHOLHDL 3 04/30/2019 1507   VLDL 16.8 04/30/2019 1507   LDLCALC 102 (H) 04/30/2019 1507   LDLCALC 107 (H) 02/03/2017 1650    CBC    Component Value Date/Time   WBC 4.8 04/30/2019 1507   RBC 3.64 (L) 04/30/2019 1507   HGB 10.8 (L) 04/30/2019 1507   HCT 32.5 (L) 04/30/2019 1507   PLT 252.0 04/30/2019 1507   MCV 89.2 04/30/2019 1507   MCH 29.1 02/03/2017 1650   MCHC 33.3 04/30/2019 1507   RDW 13.8 04/30/2019 1507    Hgb A1C No results found for: HGBA1C         Assessment & Plan:   Preventative Health Maintenance:  Flu shot UTD Tetanus UTD Covid vaccines UTD Pap smear due 2023 Mammogram scheduled for 06/2020 Colon screening UTD Encouraged her to consume a balanced diet and exercise regimen Advised her to see an eye doctor and dentist annually. Will check CBC, CMET, Lipid and Hep C today  RTC in 1 year, sooner if needed    Nicki Reaper, NP This visit occurred during the  SARS-CoV-2 public health emergency.  Safety protocols were in place, including screening questions prior to the visit, additional usage of staff PPE, and extensive cleaning of exam room while observing appropriate contact time as indicated for disinfecting solutions.

## 2020-05-26 NOTE — Assessment & Plan Note (Signed)
Continue Belsomra, refilled today

## 2020-05-26 NOTE — Assessment & Plan Note (Signed)
Continue Valtrex as needed

## 2020-05-26 NOTE — Patient Instructions (Signed)
Health Maintenance, Female Adopting a healthy lifestyle and getting preventive care are important in promoting health and wellness. Ask your health care provider about:  The right schedule for you to have regular tests and exams.  Things you can do on your own to prevent diseases and keep yourself healthy. What should I know about diet, weight, and exercise? Eat a healthy diet  Eat a diet that includes plenty of vegetables, fruits, low-fat dairy products, and lean protein.  Do not eat a lot of foods that are high in solid fats, added sugars, or sodium.   Maintain a healthy weight Body mass index (BMI) is used to identify weight problems. It estimates body fat based on height and weight. Your health care provider can help determine your BMI and help you achieve or maintain a healthy weight. Get regular exercise Get regular exercise. This is one of the most important things you can do for your health. Most adults should:  Exercise for at least 150 minutes each week. The exercise should increase your heart rate and make you sweat (moderate-intensity exercise).  Do strengthening exercises at least twice a week. This is in addition to the moderate-intensity exercise.  Spend less time sitting. Even light physical activity can be beneficial. Watch cholesterol and blood lipids Have your blood tested for lipids and cholesterol at 53 years of age, then have this test every 5 years. Have your cholesterol levels checked more often if:  Your lipid or cholesterol levels are high.  You are older than 53 years of age.  You are at high risk for heart disease. What should I know about cancer screening? Depending on your health history and family history, you may need to have cancer screening at various ages. This may include screening for:  Breast cancer.  Cervical cancer.  Colorectal cancer.  Skin cancer.  Lung cancer. What should I know about heart disease, diabetes, and high blood  pressure? Blood pressure and heart disease  High blood pressure causes heart disease and increases the risk of stroke. This is more likely to develop in people who have high blood pressure readings, are of African descent, or are overweight.  Have your blood pressure checked: ? Every 3-5 years if you are 18-39 years of age. ? Every year if you are 40 years old or older. Diabetes Have regular diabetes screenings. This checks your fasting blood sugar level. Have the screening done:  Once every three years after age 40 if you are at a normal weight and have a low risk for diabetes.  More often and at a younger age if you are overweight or have a high risk for diabetes. What should I know about preventing infection? Hepatitis B If you have a higher risk for hepatitis B, you should be screened for this virus. Talk with your health care provider to find out if you are at risk for hepatitis B infection. Hepatitis C Testing is recommended for:  Everyone born from 1945 through 1965.  Anyone with known risk factors for hepatitis C. Sexually transmitted infections (STIs)  Get screened for STIs, including gonorrhea and chlamydia, if: ? You are sexually active and are younger than 53 years of age. ? You are older than 53 years of age and your health care provider tells you that you are at risk for this type of infection. ? Your sexual activity has changed since you were last screened, and you are at increased risk for chlamydia or gonorrhea. Ask your health care provider   if you are at risk.  Ask your health care provider about whether you are at high risk for HIV. Your health care provider may recommend a prescription medicine to help prevent HIV infection. If you choose to take medicine to prevent HIV, you should first get tested for HIV. You should then be tested every 3 months for as long as you are taking the medicine. Pregnancy  If you are about to stop having your period (premenopausal) and  you may become pregnant, seek counseling before you get pregnant.  Take 400 to 800 micrograms (mcg) of folic acid every day if you become pregnant.  Ask for birth control (contraception) if you want to prevent pregnancy. Osteoporosis and menopause Osteoporosis is a disease in which the bones lose minerals and strength with aging. This can result in bone fractures. If you are 65 years old or older, or if you are at risk for osteoporosis and fractures, ask your health care provider if you should:  Be screened for bone loss.  Take a calcium or vitamin D supplement to lower your risk of fractures.  Be given hormone replacement therapy (HRT) to treat symptoms of menopause. Follow these instructions at home: Lifestyle  Do not use any products that contain nicotine or tobacco, such as cigarettes, e-cigarettes, and chewing tobacco. If you need help quitting, ask your health care provider.  Do not use street drugs.  Do not share needles.  Ask your health care provider for help if you need support or information about quitting drugs. Alcohol use  Do not drink alcohol if: ? Your health care provider tells you not to drink. ? You are pregnant, may be pregnant, or are planning to become pregnant.  If you drink alcohol: ? Limit how much you use to 0-1 drink a day. ? Limit intake if you are breastfeeding.  Be aware of how much alcohol is in your drink. In the [place] one drink equals one 12 oz bottle of beer (355 mL), one 5 oz glass of wine (148 mL), or one 1 oz glass of hard liquor (44 mL). General instructions  Schedule regular health, dental, and eye exams.  Stay current with your vaccines.  Tell your health care provider if: ? You often feel depressed. ? You have ever been abused or do not feel safe at home. Summary  Adopting a healthy lifestyle and getting preventive care are important in promoting health and wellness.  Follow your health care provider's instructions about healthy  diet, exercising, and getting tested or screened for diseases.  Follow your health care provider's instructions on monitoring your cholesterol and blood pressure. This information is not intended to replace advice given to you by your health care provider. Make sure you discuss any questions you have with your health care provider. Document Revised: 03/28/2018 Document Reviewed: 03/28/2018 Elsevier Patient Education  2021 Elsevier Inc.  

## 2020-05-26 NOTE — Assessment & Plan Note (Signed)
Continue Plaquenil  she will continue to follow with rheumatology

## 2020-05-26 NOTE — Assessment & Plan Note (Signed)
Continue Plaquenil She will continue to follow with rheumatology 

## 2020-05-26 NOTE — Assessment & Plan Note (Signed)
Continue Cipro after intercourse

## 2020-05-26 NOTE — Assessment & Plan Note (Signed)
Avoid triggers Continue Tums as needed 

## 2020-05-26 NOTE — Assessment & Plan Note (Signed)
Continue Xanax/Propranolol as needed for breakthrough, refilled today Will monitor

## 2020-05-27 LAB — HEPATITIS C ANTIBODY
Hepatitis C Ab: NONREACTIVE
SIGNAL TO CUT-OFF: 0.08 (ref <1.00)

## 2020-05-28 ENCOUNTER — Encounter: Payer: Self-pay | Admitting: Internal Medicine

## 2020-05-28 DIAGNOSIS — E7800 Pure hypercholesterolemia, unspecified: Secondary | ICD-10-CM

## 2020-05-28 DIAGNOSIS — E559 Vitamin D deficiency, unspecified: Secondary | ICD-10-CM

## 2020-05-28 DIAGNOSIS — D649 Anemia, unspecified: Secondary | ICD-10-CM

## 2020-05-29 NOTE — Addendum Note (Signed)
Addended by: Roena Malady on: 05/29/2020 01:40 PM   Modules accepted: Orders

## 2020-06-11 MED FILL — HYDROXYCHLOROQUINE SULFATE: 200 | 90 days supply | Qty: 90 | Fill #1

## 2020-06-23 ENCOUNTER — Other Ambulatory Visit: Payer: Self-pay | Admitting: Internal Medicine

## 2020-06-23 ENCOUNTER — Encounter: Payer: Self-pay | Admitting: Internal Medicine

## 2020-06-23 MED ORDER — OMEPRAZOLE 20 MG PO CPDR
20.0000 mg | DELAYED_RELEASE_CAPSULE | Freq: Every day | ORAL | 3 refills | Status: DC
Start: 2020-06-23 — End: 2020-06-23

## 2020-06-23 MED FILL — OMEPRAZOLE DR 20 MG CAPSULE: 20 | 30 days supply | Qty: 30 | Fill #0

## 2020-07-06 ENCOUNTER — Other Ambulatory Visit: Payer: Self-pay | Admitting: Internal Medicine

## 2020-07-06 ENCOUNTER — Inpatient Hospital Stay: Admission: RE | Admit: 2020-07-06 | Payer: No Typology Code available for payment source | Source: Ambulatory Visit

## 2020-07-06 DIAGNOSIS — Z1231 Encounter for screening mammogram for malignant neoplasm of breast: Secondary | ICD-10-CM

## 2020-08-06 ENCOUNTER — Other Ambulatory Visit: Payer: Self-pay

## 2020-08-06 ENCOUNTER — Ambulatory Visit
Admission: RE | Admit: 2020-08-06 | Discharge: 2020-08-06 | Disposition: A | Discharge status: Home / Self Care | Payer: No Typology Code available for payment source | Source: Ambulatory Visit | Attending: Internal Medicine | Admitting: Internal Medicine

## 2020-08-06 ENCOUNTER — Encounter: Payer: No Typology Code available for payment source | Admitting: Internal Medicine

## 2020-08-06 DIAGNOSIS — Z1231 Encounter for screening mammogram for malignant neoplasm of breast: Secondary | ICD-10-CM

## 2020-08-28 ENCOUNTER — Other Ambulatory Visit (HOSPITAL_COMMUNITY): Payer: Self-pay

## 2020-08-28 ENCOUNTER — Other Ambulatory Visit: Payer: Self-pay | Admitting: Internal Medicine

## 2020-08-28 MED ORDER — BELSOMRA 10 MG PO TABS
1.0000 | ORAL_TABLET | Freq: Every day | ORAL | 0 refills | Status: DC
  Filled 2020-08-28: qty 30, 30d supply, fill #0

## 2020-08-28 MED FILL — Propranolol HCl Tab 10 MG: ORAL | 30 days supply | Qty: 30 | Fill #0 | Status: AC

## 2020-08-28 MED FILL — Hydroxychloroquine Sulfate Tab 200 MG: ORAL | 90 days supply | Qty: 90 | Fill #0 | Status: AC

## 2020-08-28 NOTE — Telephone Encounter (Signed)
  Last refill: 06/15/2020  Future visit scheduled: yes  Notes to clinic:  review for change requested by pharmacy    Requested Prescriptions  Pending Prescriptions Disp Refills   Suvorexant (BELSOMRA) 10 MG TABS 30 tablet 0    Sig: TAKE 1 TABLET BY MOUTH AT BEDTIME.      There is no refill protocol information for this order

## 2020-09-02 ENCOUNTER — Other Ambulatory Visit: Payer: Self-pay | Admitting: Internal Medicine

## 2020-09-02 MED ORDER — CIPROFLOXACIN HCL 250 MG PO TABS
250.0000 mg | ORAL_TABLET | ORAL | 0 refills | Status: DC
  Filled 2020-09-02: qty 30, 30d supply, fill #0

## 2020-09-03 ENCOUNTER — Other Ambulatory Visit (HOSPITAL_COMMUNITY): Payer: Self-pay

## 2020-09-03 MED ORDER — UREA 40 % EX CREA
TOPICAL_CREAM | CUTANEOUS | 11 refills | Status: DC
  Filled 2020-09-03: qty 28.35, 14d supply, fill #0
  Filled 2020-11-03 – 2020-12-08 (×3): qty 28.35, 14d supply, fill #1
  Filled 2021-02-11 – 2021-02-24 (×2): qty 28.35, 14d supply, fill #2
  Filled 2021-05-21: qty 28.35, 14d supply, fill #3

## 2020-09-04 ENCOUNTER — Other Ambulatory Visit (HOSPITAL_COMMUNITY): Payer: Self-pay

## 2020-10-09 ENCOUNTER — Ambulatory Visit: Payer: Self-pay | Admitting: Internal Medicine

## 2020-10-09 ENCOUNTER — Encounter: Payer: Self-pay | Admitting: Internal Medicine

## 2020-10-09 ENCOUNTER — Telehealth (INDEPENDENT_AMBULATORY_CARE_PROVIDER_SITE_OTHER): Payer: No Typology Code available for payment source | Admitting: Internal Medicine

## 2020-10-09 DIAGNOSIS — R0683 Snoring: Secondary | ICD-10-CM | POA: Diagnosis not present

## 2020-10-09 DIAGNOSIS — R400 Somnolence: Secondary | ICD-10-CM

## 2020-10-09 NOTE — Patient Instructions (Signed)
Screening for Sleep Apnea Sleep apnea is a condition in which breathing pauses or becomes shallow during sleep. Sleep apnea screening is a test to determine if you are at risk for sleep apnea. The test includes a series of questions. It will only takes a few minutes. Your health care provider may ask you to have this test in preparation for surgery or as part of a physical exam. What are the symptoms of sleep apnea? Common symptoms of sleep apnea include: Snoring. Waking up often at night. Daytime sleepiness. Pauses in breathing. Choking or gasping during sleep. Irritability. Forgetfulness. Trouble thinking clearly. Depression. Personality changes. Most people with sleep apnea do not know that they have it. What are the advantages of sleep apnea screening? Getting screened for sleep apnea can help: Ensure your safety. It is important for your health care providers to know whether or not you have sleep apnea, especially if you are having surgery or have other long-term (chronic) health conditions. Improve your health and allow you to get a better night's rest. Restful sleep can help you: Have more energy. Lose weight. Improve high blood pressure. Improve diabetes management. Prevent stroke. Prevent car accidents. What happens during the screening? Screening usually includes being asked a list of questions about your sleep quality. Some questions you may be asked include: Do you snore? Is your sleep restless? Do you have daytime sleepiness? Has a partner or spouse told you that you stop breathing during sleep? Have you had trouble concentrating or memory loss? What is your age? What is your neck circumference? To measure your neck, keep your back straight and gently wrap the tape measure around your neck. Put the tape measure at the middle of your neck, between your chin and collarbone. What is your sex assigned at birth? Do you have or are you being treated for high blood  pressure? If your screening test is positive, you are at risk for the condition. Further testing may be needed to confirm a diagnosis of sleep apnea. Where to find more information You can find screening tools online or at your health care clinic. For more information about sleep apnea screening and healthy sleep, visit these websites: Centers for Disease Control and Prevention: www.cdc.gov American Sleep Apnea Association: www.sleepapnea.org Contact a health care provider if: You think that you may have sleep apnea. Summary Sleep apnea screening can help determine if you are at risk for sleep apnea. It is important for your health care providers to know whether or not you have sleep apnea, especially if you are having surgery or have other chronic health conditions. You may be asked to take a screening test for sleep apnea in preparation for surgery or as part of a physical exam. This information is not intended to replace advice given to you by your health care provider. Make sure you discuss any questions you have with your health care provider. Document Revised: 03/13/2020 Document Reviewed: 03/13/2020 Elsevier Patient Education  2022 Elsevier Inc.  

## 2020-10-09 NOTE — Progress Notes (Signed)
   Subjective:    Patient ID: Cheryl Williams, female    DOB: 16-May-1965, 55 y.o.   MRN: [id number]  HPI    Review of Systems     Objective:   Physical Exam        Assessment & Plan:

## 2020-10-09 NOTE — Progress Notes (Signed)
Virtual Visit via Video Note  I connected with Cheryl Williams on 10/09/20 at  1:20 PM EDT by a video enabled telemedicine application and verified that I am speaking with the correct person using two identifiers.  Location: Patient: Home Provider: Office  Person's participating in this video call: Nicki Reaper, NP and Altamese Dilling   I discussed the limitations of evaluation and management by telemedicine and the availability of in person appointments. The patient expressed understanding and agreed to proceed.  History of Present Illness:  Pt reports snoring. She reports this has been going on for at least a year. She has not been told that she gasps, chokes, coughs and stops breathing in her sleep. She does not feel rested when she wakes up. She is not napping during the day. She has not gained a significant amount of weight. She does have insomnia, managed on Belsomra. She reports her mother has been diagnosed with sleep apnea.    Past Medical History:  Diagnosis Date   Anemia    Anxiety    claustrphobia   Chicken pox    GERD (gastroesophageal reflux disease)    IBS (irritable bowel syndrome)    Lupus (HCC)    Raynaud disease    Sjogrens syndrome (HCC)     Current Outpatient Medications  Medication Sig Dispense Refill   ALPRAZolam (XANAX) 0.5 MG tablet TAKE 1 TABLET (0.5 MG TOTAL) BY MOUTH DAILY AS NEEDED FOR ANXIETY. 20 tablet 0   Biotin 5000 MCG CAPS Take by mouth daily.     ciprofloxacin (CIPRO) 250 MG tablet TAKE 1 TABLET BY MOUTH ONCE AFTER INTERCOURSE. 30 tablet 0   estradiol (ESTRACE) 0.1 MG/GM vaginal cream PLACE 1 APPLICATORFUL VAGINALLY 3 (THREE) TIMES A WEEK. 42.5 g 12   hydroxychloroquine (PLAQUENIL) 200 MG tablet Take by mouth daily.     hydroxychloroquine (PLAQUENIL) 200 MG tablet TAKE 1 TABLET BY MOUTH WITH FOOD OR MILK ONCE A DAY 90 tablet 2   ibuprofen (ADVIL,MOTRIN) 600 MG tablet TAKE 1 TABLET BY MOUTH 3 TIMES DAILY. 60 tablet 5   omeprazole  (PRILOSEC) 20 MG capsule TAKE 1 CAPSULE (20 MG TOTAL) BY MOUTH DAILY. 30 capsule 3   propranolol (INDERAL) 10 MG tablet TAKE 1 TABLET (10 MG TOTAL) BY MOUTH DAILY AS NEEDED. 30 tablet 1   Suvorexant (BELSOMRA) 10 MG TABS TAKE 1 TABLET BY MOUTH AT BEDTIME. 30 tablet 0   urea (CARMOL) 40 % CREA Apply to the affected toe daily to twice daily as needed. 28.35 g 11   valACYclovir (VALTREX) 1000 MG tablet TAKE 1/2 TABLET BY MOUTH TWICE DAILY FOR 3 DAYS FOR RECURRENT OUTBREAKS AND 1 TAB DAILY FOR SUPPRESSIVE THERAPY. 30 tablet 0   No current facility-administered medications for this visit.    Allergies  Allergen Reactions   Penicillins      Rash and hives at age 74   Bactrim [Sulfamethoxazole-Trimethoprim] Hives   Imuran [Azathioprine] Other (See Comments)    Mental hallucinations   Trimethoprim Hives    Family History  Problem Relation Age of Onset   Hyperlipidemia Mother    Hypertension Mother    Deafness Mother    Colon polyps Mother    Alcohol abuse Father    Deafness Father    Drug abuse Sister    Hypertension Sister    Drug abuse Brother    Rheum arthritis Maternal Aunt    Lupus Maternal Aunt    Rheum arthritis Maternal Grandmother    Hypertension Sister  Colon cancer Neg Hx    Esophageal cancer Neg Hx    Rectal cancer Neg Hx    Stomach cancer Neg Hx     Social History   Socioeconomic History   Marital status: Divorced    Spouse name: Not on file   Number of children: Not on file   Years of education: Not on file   Highest education level: Not on file  Occupational History   Not on file  Tobacco Use   Smoking status: Never   Smokeless tobacco: Never  Substance and Sexual Activity   Alcohol use: Yes    Alcohol/week: 0.0 standard drinks    Comment: occasional--moderate   Drug use: No   Sexual activity: Yes  Other Topics Concern   Not on file  Social History Narrative   Not on file   Social Determinants of Health   Financial Resource Strain: Not on file   Food Insecurity: Not on file  Transportation Needs: Not on file  Physical Activity: Not on file  Stress: Not on file  Social Connections: Not on file  Intimate Partner Violence: Not on file     Constitutional: Pt reports daytime fatigue. Denies fever, malaise, headache or abrupt weight changes.  HEENT: Denies eye pain, eye redness, ear pain, ringing in the ears, wax buildup, runny nose, nasal congestion, bloody nose, or sore throat. Respiratory: Pt reports snoring. Denies difficulty breathing, shortness of breath, cough or sputum production.   Cardiovascular: Denies chest pain, chest tightness, palpitations or swelling in the hands or feet.  Neurological: Denies dizziness, difficulty with memory, difficulty with speech or problems with balance and coordination.    No other specific complaints in a complete review of systems (except as listed in HPI above).  Observations/Objective:  There were no vitals taken for this visit. Wt Readings from Last 3 Encounters:  05/26/20 153 lb (69.4 kg)  01/06/20 149 lb (67.6 kg)  10/24/19 150 lb (68 kg)    General: Appears her stated age, well developed, well nourished in NAD. HEENT: Head: normal shape and size;  Pulmonary/Chest: Normal effort. No respiratory distress.  Neurological: Alert and oriented.   BMET    Component Value Date/Time   NA 139 05/26/2020 0955   K 3.7 05/26/2020 0955   CL 102 05/26/2020 0955   CO2 32 05/26/2020 0955   GLUCOSE 82 05/26/2020 0955   BUN 14 05/26/2020 0955   CREATININE 0.81 05/26/2020 0955   CREATININE 1.09 02/03/2017 1650   CALCIUM 9.7 05/26/2020 0955    Lipid Panel     Component Value Date/Time   CHOL 223 (H) 05/26/2020 0955   TRIG 72.0 05/26/2020 0955   HDL 67.40 05/26/2020 0955   CHOLHDL 3 05/26/2020 0955   VLDL 14.4 05/26/2020 0955   LDLCALC 141 (H) 05/26/2020 0955   LDLCALC 107 (H) 02/03/2017 1650    CBC    Component Value Date/Time   WBC 3.0 (L) 05/26/2020 0955   RBC 3.61 (L)  05/26/2020 0955   HGB 10.5 (L) 05/26/2020 0955   HCT 32.1 (L) 05/26/2020 0955   PLT 214.0 05/26/2020 0955   MCV 88.8 05/26/2020 0955   MCH 29.1 02/03/2017 1650   MCHC 32.9 05/26/2020 0955   RDW 13.9 05/26/2020 0955    Hgb A1C No results found for: HGBA1C      Assessment and Plan:  Snoring, Daytime Sleepiness  ESS score of 0 Unable to obtain neck circumference at this time  Referral to pulmonology at pt's request for further  evaluation, possible sleep study   Follow Up Instructions:    I discussed the assessment and treatment plan with the patient. The patient was provided an opportunity to ask questions and all were answered. The patient agreed with the plan and demonstrated an understanding of the instructions.   The patient was advised to call back or seek an in-person evaluation if the symptoms worsen or if the condition fails to improve as anticipated.    Nicki Reaper, NP

## 2020-11-03 ENCOUNTER — Other Ambulatory Visit: Payer: Self-pay | Admitting: Internal Medicine

## 2020-11-03 DIAGNOSIS — A609 Anogenital herpesviral infection, unspecified: Secondary | ICD-10-CM

## 2020-11-03 MED FILL — Omeprazole Cap Delayed Release 20 MG: ORAL | 30 days supply | Qty: 30 | Fill #0 | Status: CN

## 2020-11-04 ENCOUNTER — Other Ambulatory Visit (HOSPITAL_COMMUNITY): Payer: Self-pay

## 2020-11-04 MED ORDER — VALACYCLOVIR HCL 1 G PO TABS
ORAL_TABLET | ORAL | 0 refills | Status: DC
  Filled 2020-11-04 – 2020-12-08 (×2): qty 30, 30d supply, fill #0

## 2020-11-04 MED ORDER — BELSOMRA 10 MG PO TABS
1.0000 | ORAL_TABLET | Freq: Every day | ORAL | 0 refills | Status: DC
  Filled 2020-11-04 – 2020-12-08 (×2): qty 30, 30d supply, fill #0

## 2020-11-04 NOTE — Telephone Encounter (Signed)
ave expired  Review for continued use and refill    Requested Prescriptions  Pending Prescriptions Disp Refills   valACYclovir (VALTREX) 1000 MG tablet 30 tablet 0    Sig: TAKE 1/2 TABLET BY MOUTH TWICE DAILY FOR 3 DAYS FOR RECURRENT OUTBREAKS AND 1 TAB DAILY FOR SUPPRESSIVE THERAPY.      There is no refill protocol information for this order      Suvorexant (BELSOMRA) 10 MG TABS 30 tablet 0    Sig: TAKE 1 TABLET BY MOUTH AT BEDTIME.      There is no refill protocol information for this order

## 2020-11-05 ENCOUNTER — Other Ambulatory Visit (HOSPITAL_COMMUNITY): Payer: Self-pay

## 2020-11-12 ENCOUNTER — Other Ambulatory Visit (HOSPITAL_COMMUNITY): Payer: Self-pay

## 2020-11-17 ENCOUNTER — Encounter: Payer: Self-pay | Admitting: Internal Medicine

## 2020-12-04 ENCOUNTER — Other Ambulatory Visit (HOSPITAL_COMMUNITY): Payer: Self-pay

## 2020-12-04 MED ORDER — PROMETHAZINE-DM 6.25-15 MG/5ML PO SYRP
5.0000 mL | ORAL_SOLUTION | Freq: Two times a day (BID) | ORAL | 0 refills | Status: DC | PRN
  Filled 2020-12-04: qty 118, 12d supply, fill #0

## 2020-12-08 ENCOUNTER — Other Ambulatory Visit (HOSPITAL_COMMUNITY): Payer: Self-pay

## 2020-12-08 MED ORDER — CARESTART COVID-19 HOME TEST VI KIT
PACK | 0 refills | Status: DC
  Filled 2020-12-08: qty 2, 2d supply, fill #0

## 2020-12-08 MED FILL — Omeprazole Cap Delayed Release 20 MG: ORAL | 30 days supply | Qty: 30 | Fill #0 | Status: AC

## 2020-12-15 ENCOUNTER — Other Ambulatory Visit: Payer: Self-pay | Admitting: Internal Medicine

## 2020-12-15 ENCOUNTER — Other Ambulatory Visit (HOSPITAL_COMMUNITY): Payer: Self-pay

## 2020-12-15 NOTE — Telephone Encounter (Signed)
Requested medications are due for refill today.  yes  Requested medications are on the active medications list.  yes  Last refill. 09/02/2020  Future visit scheduled.   yes  Notes to clinic.  No protocol.

## 2020-12-16 ENCOUNTER — Other Ambulatory Visit (HOSPITAL_COMMUNITY): Payer: Self-pay

## 2020-12-16 MED ORDER — CIPROFLOXACIN HCL 250 MG PO TABS
250.0000 mg | ORAL_TABLET | ORAL | 0 refills | Status: DC
  Filled 2020-12-16 – 2020-12-31 (×2): qty 30, 30d supply, fill #0

## 2020-12-24 ENCOUNTER — Other Ambulatory Visit: Payer: Self-pay

## 2020-12-24 ENCOUNTER — Other Ambulatory Visit (HOSPITAL_COMMUNITY): Payer: Self-pay

## 2020-12-24 ENCOUNTER — Ambulatory Visit: Payer: No Typology Code available for payment source | Admitting: Pulmonary Disease

## 2020-12-24 ENCOUNTER — Encounter: Payer: Self-pay | Admitting: Pulmonary Disease

## 2020-12-24 VITALS — BP 110/76 | HR 74 | Ht 67.5 in | Wt 155.0 lb

## 2020-12-24 DIAGNOSIS — G4733 Obstructive sleep apnea (adult) (pediatric): Secondary | ICD-10-CM

## 2020-12-24 DIAGNOSIS — F6381 Intermittent explosive disorder: Secondary | ICD-10-CM | POA: Diagnosis not present

## 2020-12-24 NOTE — Progress Notes (Signed)
Cheryl Williams    [id number]    21-Jan-1966  Primary Care Physician:Baity, Coralie Keens, NP  Referring Physician: Jearld Fenton, NP [place],  [place] [address]  Chief complaint:   Patient being seen for snoring, daytime fatigue  HPI:  Has been told about increasing snoring Has daytime fatigue that she feels is related to lupus and Sjogren's Admits to dryness of her mouth She is fatigued, not unusually sleepy Admits to snoring, dryness of her mouth, tired Nonrestorative sleep Usually goes to bed between 11 and 1 AM Falls asleep in a few minutes 2-3 awakenings Usually wakes up about 8 AM Weight has been stable She is menopausal Never smoker  Mom has obstructive sleep apnea   Outpatient Encounter Medications as of 12/24/2020  Medication Sig   ciprofloxacin (CIPRO) 250 MG tablet Take 1 tablet (250 mg total) by mouth once after intercourse   estradiol (ESTRACE) 0.1 MG/GM vaginal cream PLACE 1 APPLICATORFUL VAGINALLY 3 (THREE) TIMES A WEEK.   hydroxychloroquine (PLAQUENIL) 200 MG tablet TAKE 1 TABLET BY MOUTH WITH FOOD OR MILK ONCE A DAY   ibuprofen (ADVIL,MOTRIN) 600 MG tablet TAKE 1 TABLET BY MOUTH 3 TIMES DAILY.   omeprazole (PRILOSEC) 20 MG capsule TAKE 1 CAPSULE (20 MG TOTAL) BY MOUTH DAILY.   promethazine-dextromethorphan (PROMETHAZINE-DM) 6.25-15 MG/5ML syrup Take 5 mLs by mouth 2 (two) times daily as needed for cough.   propranolol (INDERAL) 10 MG tablet TAKE 1 TABLET (10 MG TOTAL) BY MOUTH DAILY AS NEEDED.   Suvorexant (BELSOMRA) 10 MG TABS TAKE 1 TABLET BY MOUTH AT BEDTIME.   urea (CARMOL) 40 % CREA Apply to the affected toe daily to twice daily as needed.   valACYclovir (VALTREX) 1000 MG tablet TAKE 1/2 TABLET BY MOUTH TWICE DAILY FOR 3 DAYS FOR RECURRENT OUTBREAKS AND 1 TABLET DAILY FOR SUPPRESSIVE THERAPY.   [DISCONTINUED] COVID-19 At Home Antigen Test [facility] COVID-19 HOME TEST) KIT Use as directed (Patient not taking: Reported on 12/24/2020)    [DISCONTINUED] hydroxychloroquine (PLAQUENIL) 200 MG tablet Take by mouth daily. (Patient not taking: Reported on 12/24/2020)   No facility-administered encounter medications on file as of 12/24/2020.    Allergies as of 12/24/2020 - Review Complete 12/24/2020  Allergen Reaction Noted   Bactrim [sulfamethoxazole-trimethoprim] Hives 10/24/2019   Imuran [azathioprine] Other (See Comments) 11/20/2013   Penicillins  03/08/2012   Trimethoprim Hives 10/24/2019    Past Medical History:  Diagnosis Date   Anemia    Anxiety    claustrphobia   Chicken pox    GERD (gastroesophageal reflux disease)    IBS (irritable bowel syndrome)    Lupus (HCC)    Raynaud disease    Sjogrens syndrome (HCC)     Past Surgical History:  Procedure Laterality Date   BUNIONECTOMY Left    [facility]   CHOLECYSTECTOMY  2006    Family History  Problem Relation Age of Onset   Hyperlipidemia Mother    Hypertension Mother    Deafness Mother    Colon polyps Mother    Alcohol abuse Father    Deafness Father    Drug abuse Sister    Hypertension Sister    Drug abuse Brother    Rheum arthritis Maternal Aunt    Lupus Maternal Aunt    Rheum arthritis Maternal Grandmother    Hypertension Sister    Colon cancer Neg Hx    Esophageal cancer Neg Hx    Rectal cancer Neg  Hx    Stomach cancer Neg Hx     Social History   Socioeconomic History   Marital status: Divorced    Spouse name: Not on file   Number of children: Not on file   Years of education: Not on file   Highest education level: Not on file  Occupational History   Not on file  Tobacco Use   Smoking status: Never   Smokeless tobacco: Never  Substance and Sexual Activity   Alcohol use: Yes    Alcohol/week: 0.0 standard drinks    Comment: occasional--moderate   Drug use: No   Sexual activity: Yes  Other Topics Concern   Not on file  Social History Narrative   Not on file   Social Determinants of Health   Financial Resource Strain: Not  on file  Food Insecurity: Not on file  Transportation Needs: Not on file  Physical Activity: Not on file  Stress: Not on file  Social Connections: Not on file  Intimate Partner Violence: Not on file    Review of Systems  Constitutional:  Positive for fatigue.  Respiratory:  Negative for shortness of breath.   Psychiatric/Behavioral:  Positive for sleep disturbance.    Vitals:   12/24/20 1334  BP: 110/76  Pulse: 74  SpO2: 98%     Physical Exam Constitutional:      Appearance: Normal appearance. She is normal weight.  HENT:     Head: Normocephalic.     Right Ear: There is no impacted cerumen.     Nose: No congestion.     Mouth/Throat:     Mouth: Mucous membranes are moist.     Comments: Mallampati 3, Eyes:     Pupils: Pupils are equal, round, and reactive to light.  Cardiovascular:     Rate and Rhythm: Normal rate and regular rhythm.     Heart sounds: No murmur heard.   No friction rub.  Pulmonary:     Effort: No respiratory distress.     Breath sounds: No stridor. No wheezing or rhonchi.  Musculoskeletal:     Cervical back: No rigidity or tenderness.  Neurological:     Mental Status: She is alert.    Epworth of zero  Data Reviewed: Care everywhere reviewed  Assessment:  Mild to moderate probability of significant obstructive sleep apnea  Nonrestorative sleep  History of Sjogren's, history of lupus  Daytime fatigue  Pathophysiology of sleep disordered breathing discussed with the patient Treatment options for sleep disordered breathing discussed with the patient  Plan/Recommendations: We will schedule the patient for an in lab split-night study  Treatment options were reviewed  Sleep position modification for snoring encouraged  Tentative follow-up in 3 to 4 months   Sherrilyn Rist MD [facility] 12/24/2020, 1:40 PM  CC: Jearld Fenton, NP

## 2020-12-24 NOTE — Patient Instructions (Signed)
Mild to moderate probability of significant sleep apnea  We will schedule you for an in lab split-night study  Tentatively see you back in about 3 months  Sleep Apnea Sleep apnea affects breathing during sleep. It causes breathing to stop for 10 seconds or more, or to become shallow. People with sleep apnea usually snore loudly. It can also increase the risk of: Heart attack. Stroke. Being very overweight (obese). Diabetes. Heart failure. Irregular heartbeat. High blood pressure. The goal of treatment is to help you breathe normally again. What are the causes? The most common cause of this condition is a collapsed or blocked airway. There are three kinds of sleep apnea: Obstructive sleep apnea. This is caused by a blocked or collapsed airway. Central sleep apnea. This happens when the brain does not send the right signals to the muscles that control breathing. Mixed sleep apnea. This is a combination of obstructive and central sleep apnea. What increases the risk? Being overweight. Smoking. Having a small airway. Being older. Being female. Drinking alcohol. Taking medicines to calm yourself (sedatives or tranquilizers). Having family members with the condition. Having a tongue or tonsils that are larger than normal. What are the signs or symptoms? Trouble staying asleep. Loud snoring. Headaches in the morning. Waking up gasping. Dry mouth or sore throat in the morning. Being sleepy or tired during the day. If you are sleepy or tired during the day, you may also: Not be able to focus your mind (concentrate). Forget things. Get angry a lot and have mood swings. Feel sad (depressed). Have changes in your personality. Have less interest in sex, if you are female. Be unable to have an erection, if you are female. How is this treated?  Sleeping on your side. Using a medicine to get rid of mucus in your nose (decongestant). Avoiding the use of alcohol, medicines to help you  relax, or certain pain medicines (narcotics). Losing weight, if needed. Changing your diet. Quitting smoking. Using a machine to open your airway while you sleep, such as: An oral appliance. This is a mouthpiece that shifts your lower jaw forward. A CPAP device. This device blows air through a mask when you breathe out (exhale). An EPAP device. This has valves that you put in each nostril. A BPAP device. This device blows air through a mask when you breathe in (inhale) and breathe out. Having surgery if other treatments do not work. Follow these instructions at home: Lifestyle Make changes that your doctor recommends. Eat a healthy diet. Lose weight if needed. Avoid alcohol, medicines to help you relax, and some pain medicines. Do not smoke or use any products that contain nicotine or tobacco. If you need help quitting, ask your doctor. General instructions Take over-the-counter and prescription medicines only as told by your doctor. If you were given a machine to use while you sleep, use it only as told by your doctor. If you are having surgery, make sure to tell your doctor you have sleep apnea. You may need to bring your device with you. Keep all follow-up visits. Contact a doctor if: The machine that you were given to use during sleep bothers you or does not seem to be working. You do not get better. You get worse. Get help right away if: Your chest hurts. You have trouble breathing in enough air. You have an uncomfortable feeling in your back, arms, or stomach. You have trouble talking. One side of your body feels weak. A part of your face is  hanging down. These symptoms may be an emergency. Get help right away. Call your local emergency services (911 in the [place] Do not wait to see if the symptoms will go away. Do not drive yourself to the hospital. Summary This condition affects breathing during sleep. The most common cause is a collapsed or blocked airway. The goal of  treatment is to help you breathe normally while you sleep. This information is not intended to replace advice given to you by your health care provider. Make sure you discuss any questions you have with your health care provider. Document Revised: 03/13/2020 Document Reviewed: 03/13/2020 Elsevier Patient Education  2022 [employer].

## 2020-12-31 ENCOUNTER — Other Ambulatory Visit (HOSPITAL_COMMUNITY): Payer: Self-pay

## 2020-12-31 ENCOUNTER — Ambulatory Visit (INDEPENDENT_AMBULATORY_CARE_PROVIDER_SITE_OTHER): Payer: No Typology Code available for payment source | Admitting: Sports Medicine

## 2020-12-31 ENCOUNTER — Other Ambulatory Visit: Payer: Self-pay

## 2020-12-31 VITALS — Ht 67.5 in | Wt 154.0 lb

## 2020-12-31 DIAGNOSIS — M79605 Pain in left leg: Secondary | ICD-10-CM | POA: Diagnosis not present

## 2020-12-31 MED ORDER — PREDNISONE 10 MG (21) PO TBPK
ORAL_TABLET | ORAL | 0 refills | Status: DC
  Filled 2020-12-31: qty 21, 6d supply, fill #0

## 2020-12-31 NOTE — Patient Instructions (Signed)
Thank you for coming to see me today. It was a pleasure. Today we talked about:   I have placed an order for a back x-ray.  Please go to [facility] to have this completed.  You do not need an appointment.  We will contact you with your results afterwards.  We have given you a prescription for prednisone.  This will taper over 6 days.  We will contact you about results of your x-ray and if the prednisone doesn't improve your pain, we can try something else based off your x-ray.  Do not take any other anti-inflammatories while you are taking prednisone, including ibuprofen, Aleve, Advil.  Please follow-up with [place] as needed.  If you have any questions or concerns, please do not hesitate to call the office at [phone].  Best,   Luis Abed, DO [facility]

## 2020-12-31 NOTE — Assessment & Plan Note (Addendum)
Differential includes SI vs lumbar disc pathology.  Will obtain Lumbar films to further assess.  Rx sent for pred 6 day taper to hopefully improve pain from either etiology.  Can continue topicals.  Advised to avoid other NSAIDs while on prednisone.  Will discuss XR results with her and if no improvement, these would guide next steps.  F/U prn.

## 2020-12-31 NOTE — Progress Notes (Signed)
   Cheryl Williams is a 55 y.o. female who presents to [facility] today for the following:  Left Back and Leg Pain 1 week ago, was taking her dog to the vet and he pulled her suddenly to the left She did not have pain until that night Pain goes from low back, into buttocks, and down leg, stops at mid calf Was off and on, 2 days ago was able to ride her road bike without much pain Had been using ibuprofen and Biofreeze with improvement Had trouble sleeping due to comfort for 2 nights Woke up this morning and found it painful to put pressure on her leg at all to stand Feels like she isn't standing correctly from the pain No prior pain or injury No N/T Pain is a throbbing Denies saddle anesthesia, change in bowel/bladder function, leg weakness  Reports history of Lupus and has been having generalized joint pains and fatigue recently Had COVID in July   PMH reviewed.  ROS as above. Medications reviewed.  Exam:  Ht 5' 7.5" (1.715 m)   Wt 154 lb (69.9 kg)   LMP 06/18/2019   BMI 23.76 kg/m  Gen: Well NAD MSK:  Lumbar spine: - Inspection: no gross deformity or asymmetry, swelling or ecchymosis.  She does stand in a slightly flexed position 2/2 pain - Palpation: No TTP over the spinous processes.  She does have TTP along left paraspinal musculature, but is worse over left SI joint.  Mild TTP over left greater trochanter.  No TTP of piriformis, but notes that pain is sometimes in this location - ROM: Flexion is limited to 95 degrees and has pain, extension is full, but produces pain - Strength: 5/5 strength of lower extremity in L4-S1 nerve root distributions b/l; normal gait - Neuro: sensation intact in the L4-S1 nerve root distribution b/l, 2+ L4 and S1 reflexes - Special testing: Negative straight leg raise, has very tight adductors and cannot tolerate full FABER testing, mild pain with AP thrust, negative log roll   No results found.   Assessment and Plan: 1) Left leg  pain Differential includes SI vs lumbar disc pathology.  Will obtain Lumbar films to further assess.  Rx sent for pred 6 day taper to hopefully improve pain from either etiology.  Can continue topicals.  Advised to avoid other NSAIDs while on prednisone.  Will discuss XR results with her and if no improvement, these would guide next steps.  F/U prn.   Luis Abed, D.O.  PGY-4 [facility]  12/31/2020 5:05 PM

## 2021-01-01 ENCOUNTER — Telehealth: Payer: Self-pay

## 2021-01-01 NOTE — Telephone Encounter (Signed)
Copied from CRM [id number]. Topic: General - Other >> Dec 31, 2020  8:07 AM Jaquita Rector A wrote: Reason for CRM: Patient called in to see if she could get an appointment with Nicki Reaper for today. Say that she have some left hip pain radiating down her leg. Per patient she had an incident with her dog last week and think this is the after effect. Please call patient at Ph#  [phone]

## 2021-01-01 NOTE — Telephone Encounter (Signed)
The pt was able to get an appt with Sport [facility] on yesterday and they address her concerns.

## 2021-01-22 ENCOUNTER — Ambulatory Visit: Payer: No Typology Code available for payment source | Admitting: Internal Medicine

## 2021-01-28 ENCOUNTER — Ambulatory Visit: Payer: No Typology Code available for payment source | Admitting: Internal Medicine

## 2021-01-28 ENCOUNTER — Ambulatory Visit: Payer: No Typology Code available for payment source | Admitting: Sports Medicine

## 2021-01-28 DIAGNOSIS — D649 Anemia, unspecified: Secondary | ICD-10-CM | POA: Insufficient documentation

## 2021-01-28 DIAGNOSIS — E785 Hyperlipidemia, unspecified: Secondary | ICD-10-CM | POA: Insufficient documentation

## 2021-01-28 NOTE — Progress Notes (Deleted)
Subjective:    Patient ID: Cheryl Williams, female    DOB: February 22, 1966, 55 y.o.   MRN: [id number]  HPI  Patient presents to clinic today with complaint of  Review of Systems  Past Medical History:  Diagnosis Date   Anemia    Anxiety    claustrphobia   Chicken pox    GERD (gastroesophageal reflux disease)    IBS (irritable bowel syndrome)    Lupus (HCC)    Raynaud disease    Sjogrens syndrome (HCC)     Current Outpatient Medications  Medication Sig Dispense Refill   ciprofloxacin (CIPRO) 250 MG tablet Take 1 tablet (250 mg total) by mouth once after intercourse 30 tablet 0   hydroxychloroquine (PLAQUENIL) 200 MG tablet TAKE 1 TABLET BY MOUTH WITH FOOD OR MILK ONCE A DAY 90 tablet 2   ibuprofen (ADVIL,MOTRIN) 600 MG tablet TAKE 1 TABLET BY MOUTH 3 TIMES DAILY. 60 tablet 5   omeprazole (PRILOSEC) 20 MG capsule TAKE 1 CAPSULE (20 MG TOTAL) BY MOUTH DAILY. 30 capsule 3   predniSONE (STERAPRED UNI-PAK 21 TAB) 10 MG (21) TBPK tablet Use as directed per doctors orders 21 tablet 0   promethazine-dextromethorphan (PROMETHAZINE-DM) 6.25-15 MG/5ML syrup Take 5 mLs by mouth 2 (two) times daily as needed for cough. 118 mL 0   propranolol (INDERAL) 10 MG tablet TAKE 1 TABLET (10 MG TOTAL) BY MOUTH DAILY AS NEEDED. 30 tablet 1   Suvorexant (BELSOMRA) 10 MG TABS TAKE 1 TABLET BY MOUTH AT BEDTIME. 30 tablet 0   urea (CARMOL) 40 % CREA Apply to the affected toe daily to twice daily as needed. 28.35 g 11   valACYclovir (VALTREX) 1000 MG tablet TAKE 1/2 TABLET BY MOUTH TWICE DAILY FOR 3 DAYS FOR RECURRENT OUTBREAKS AND 1 TABLET DAILY FOR SUPPRESSIVE THERAPY. 30 tablet 0   No current facility-administered medications for this visit.    Allergies  Allergen Reactions   Bactrim [Sulfamethoxazole-Trimethoprim] Hives   Imuran [Azathioprine] Other (See Comments)    Mental hallucinations   Penicillins      Rash and hives at age 46   Trimethoprim Hives    Family History  Problem Relation  Age of Onset   Hyperlipidemia Mother    Hypertension Mother    Deafness Mother    Colon polyps Mother    Alcohol abuse Father    Deafness Father    Drug abuse Sister    Hypertension Sister    Drug abuse Brother    Rheum arthritis Maternal Aunt    Lupus Maternal Aunt    Rheum arthritis Maternal Grandmother    Hypertension Sister    Colon cancer Neg Hx    Esophageal cancer Neg Hx    Rectal cancer Neg Hx    Stomach cancer Neg Hx     Social History   Socioeconomic History   Marital status: Divorced    Spouse name: Not on file   Number of children: Not on file   Years of education: Not on file   Highest education level: Not on file  Occupational History   Not on file  Tobacco Use   Smoking status: Never   Smokeless tobacco: Never  Substance and Sexual Activity   Alcohol use: Yes    Alcohol/week: 0.0 standard drinks    Comment: occasional--moderate   Drug use: No   Sexual activity: Yes  Other Topics Concern   Not on file  Social History Narrative   Not on file   Social Determinants  of Health   Financial Resource Strain: Not on file  Food Insecurity: Not on file  Transportation Needs: Not on file  Physical Activity: Not on file  Stress: Not on file  Social Connections: Not on file  Intimate Partner Violence: Not on file     Constitutional: Denies fever, malaise, fatigue, headache or abrupt weight changes.  HEENT: Denies eye pain, eye redness, ear pain, ringing in the ears, wax buildup, runny nose, nasal congestion, bloody nose, or sore throat. Respiratory: Denies difficulty breathing, shortness of breath, cough or sputum production.   Cardiovascular: Denies chest pain, chest tightness, palpitations or swelling in the hands or feet.  Gastrointestinal: Denies abdominal pain, bloating, constipation, diarrhea or blood in the stool.  GU: Denies urgency, frequency, pain with urination, burning sensation, blood in urine, odor or discharge. Musculoskeletal: Denies  decrease in range of motion, difficulty with gait, muscle pain or joint pain and swelling.  Skin: Denies redness, rashes, lesions or ulcercations.  Neurological: Denies dizziness, difficulty with memory, difficulty with speech or problems with balance and coordination.  Psych: Denies anxiety, depression, SI/HI.  No other specific complaints in a complete review of systems (except as listed in HPI above).     Objective:   Physical Exam  LMP 06/18/2019  Wt Readings from Last 3 Encounters:  12/31/20 154 lb (69.9 kg)  12/24/20 155 lb (70.3 kg)  05/26/20 153 lb (69.4 kg)    General: Appears their stated age, well developed, well nourished in NAD. Skin: Warm, dry and intact. No rashes, lesions or ulcerations noted. HEENT: Head: normal shape and size; Eyes: sclera white, no icterus, conjunctiva pink, PERRLA and EOMs intact; Ears: Tm's gray and intact, normal light reflex; Nose: mucosa pink and moist, septum midline; Throat/Mouth: Teeth present, mucosa pink and moist, no exudate, lesions or ulcerations noted.  Neck:  Neck supple, trachea midline. No masses, lumps or thyromegaly present.  Cardiovascular: Normal rate and rhythm. S1,S2 noted.  No murmur, rubs or gallops noted. No JVD or BLE edema. No carotid bruits noted. Pulmonary/Chest: Normal effort and positive vesicular breath sounds. No respiratory distress. No wheezes, rales or ronchi noted.  Abdomen: Soft and nontender. Normal bowel sounds. No distention or masses noted. Liver, spleen and kidneys non palpable. Musculoskeletal: Normal range of motion. No signs of joint swelling. No difficulty with gait.  Neurological: Alert and oriented. Cranial nerves II-XII grossly intact. Coordination normal.  Psychiatric: Mood and affect normal. Behavior is normal. Judgment and thought content normal.    BMET    Component Value Date/Time   NA 139 05/26/2020 0955   K 3.7 05/26/2020 0955   CL 102 05/26/2020 0955   CO2 32 05/26/2020 0955   GLUCOSE  82 05/26/2020 0955   BUN 14 05/26/2020 0955   CREATININE 0.81 05/26/2020 0955   CREATININE 1.09 02/03/2017 1650   CALCIUM 9.7 05/26/2020 0955    Lipid Panel     Component Value Date/Time   CHOL 223 (H) 05/26/2020 0955   TRIG 72.0 05/26/2020 0955   HDL 67.40 05/26/2020 0955   CHOLHDL 3 05/26/2020 0955   VLDL 14.4 05/26/2020 0955   LDLCALC 141 (H) 05/26/2020 0955   LDLCALC 107 (H) 02/03/2017 1650    CBC    Component Value Date/Time   WBC 3.0 (L) 05/26/2020 0955   RBC 3.61 (L) 05/26/2020 0955   HGB 10.5 (L) 05/26/2020 0955   HCT 32.1 (L) 05/26/2020 0955   PLT 214.0 05/26/2020 0955   MCV 88.8 05/26/2020 0955   MCH  29.1 02/03/2017 1650   MCHC 32.9 05/26/2020 0955   RDW 13.9 05/26/2020 0955    Hgb A1C No results found for: HGBA1C          Assessment & Plan:   Nicki Reaper, NP This visit occurred during the SARS-CoV-2 public health emergency.  Safety protocols were in place, including screening questions prior to the visit, additional usage of staff PPE, and extensive cleaning of exam room while observing appropriate contact time as indicated for disinfecting solutions.

## 2021-02-02 ENCOUNTER — Ambulatory Visit: Payer: No Typology Code available for payment source

## 2021-02-09 ENCOUNTER — Other Ambulatory Visit (HOSPITAL_BASED_OUTPATIENT_CLINIC_OR_DEPARTMENT_OTHER): Payer: Self-pay

## 2021-02-09 ENCOUNTER — Ambulatory Visit: Payer: No Typology Code available for payment source | Attending: Internal Medicine

## 2021-02-09 DIAGNOSIS — Z23 Encounter for immunization: Secondary | ICD-10-CM

## 2021-02-09 MED ORDER — PFIZER-BIONT COVID-19 VAC-TRIS 30 MCG/0.3ML IM SUSP
INTRAMUSCULAR | 0 refills | Status: DC
  Filled 2021-02-09: qty 0.3, 1d supply, fill #0

## 2021-02-09 MED ORDER — INFLUENZA VAC SPLIT QUAD 0.5 ML IM SUSY
PREFILLED_SYRINGE | INTRAMUSCULAR | 0 refills | Status: DC
  Filled 2021-02-09: qty 0.5, 1d supply, fill #0

## 2021-02-09 NOTE — Progress Notes (Signed)
   Covid-19 Vaccination Clinic  Name:  Cheryl Williams    MRN: [id number] DOB: 08-31-1965  02/09/2021  Cheryl Williams was observed post Covid-19 immunization for 15 minutes without incident. She was provided with Vaccine Information Sheet and instruction to access the V-Safe system.   Cheryl Williams was instructed to call 911 with any severe reactions post vaccine: Difficulty breathing  Swelling of face and throat  A fast heartbeat  A bad rash all over body  Dizziness and weakness   Immunizations Administered     Name Date Dose VIS Date Route   Pfizer Covid-19 Vaccine Bivalent Booster 02/09/2021 12:37 PM 0.3 mL 12/16/2020 Intramuscular   Manufacturer: [employer], [employer]   Lot: [id number]   NDC: [phone]

## 2021-02-11 ENCOUNTER — Other Ambulatory Visit (HOSPITAL_COMMUNITY): Payer: Self-pay

## 2021-02-11 ENCOUNTER — Other Ambulatory Visit: Payer: Self-pay | Admitting: Internal Medicine

## 2021-02-11 DIAGNOSIS — A609 Anogenital herpesviral infection, unspecified: Secondary | ICD-10-CM

## 2021-02-12 ENCOUNTER — Other Ambulatory Visit (HOSPITAL_COMMUNITY): Payer: Self-pay

## 2021-02-12 MED ORDER — PROPRANOLOL HCL 10 MG PO TABS
10.0000 mg | ORAL_TABLET | Freq: Every day | ORAL | 1 refills | Status: DC | PRN
  Filled 2021-02-12 – 2021-02-24 (×2): qty 30, 30d supply, fill #0

## 2021-02-12 NOTE — Telephone Encounter (Signed)
Requested medication (s) are due for refill today: Yes  Requested medication (s) are on the active medication list: Yes  Last refill:  11/04/20  Future visit scheduled: Yes  Notes to clinic:  Unable to refill per protocol, cannot delegate. Unable to refill Valocyclovir due to the sig is tapering, will need sig for 1 tab daily.      Requested Prescriptions  Pending Prescriptions Disp Refills   valACYclovir (VALTREX) 1000 MG tablet 30 tablet 0    Sig: TAKE 1/2 TABLET BY MOUTH TWICE DAILY FOR 3 DAYS FOR RECURRENT OUTBREAKS AND 1 TABLET DAILY FOR SUPPRESSIVE THERAPY.     Antimicrobials:  Antiviral Agents - Anti-Herpetic Passed - 02/12/2021  3:48 PM      Passed - Valid encounter within last 12 months    Recent Outpatient Visits           4 months ago Snoring   [facility] [place], Salvadore Oxford, NP       Future Appointments             In 4 days [place], Salvadore Oxford, NP [facility]   In 5 months [place], Salvadore Oxford, NP [facility], [facility]             Suvorexant (BELSOMRA) 10 MG TABS 30 tablet 0    Sig: TAKE 1 TABLET BY MOUTH AT BEDTIME.     Off-Protocol Failed - 02/12/2021  3:48 PM      Failed - Medication not assigned to a protocol, review manually.      Passed - Valid encounter within last 12 months    Recent Outpatient Visits           4 months ago Snoring   [facility] [place], Salvadore Oxford, NP       Future Appointments             In 4 days [place], Salvadore Oxford, NP [facility], [facility]   In 5 months [place], Salvadore Oxford, NP [facility], [place]            Signed Prescriptions Disp Refills   propranolol (INDERAL) 10 MG tablet 30 tablet 1    Sig: TAKE 1 TABLET (10 MG TOTAL) BY MOUTH DAILY AS NEEDED.     Cardiovascular:  Beta Blockers Passed - 02/12/2021  3:48 PM      Passed - Last BP in normal range    BP Readings from Last 1 Encounters:  12/24/20 110/76          Passed - Last Heart  Rate in normal range    Pulse Readings from Last 1 Encounters:  12/24/20 74          Passed - Valid encounter within last 6 months    Recent Outpatient Visits           4 months ago Snoring   [facility] [place], Salvadore Oxford, NP       Future Appointments             In 4 days [place], Salvadore Oxford, NP [facility]   In 5 months [place], Salvadore Oxford, NP [facility], [facility]

## 2021-02-14 ENCOUNTER — Encounter (HOSPITAL_BASED_OUTPATIENT_CLINIC_OR_DEPARTMENT_OTHER): Payer: No Typology Code available for payment source | Admitting: Pulmonary Disease

## 2021-02-15 ENCOUNTER — Other Ambulatory Visit (HOSPITAL_COMMUNITY): Payer: Self-pay

## 2021-02-15 MED ORDER — VALACYCLOVIR HCL 1 G PO TABS
500.0000 mg | ORAL_TABLET | ORAL | 0 refills | Status: DC
Start: 2021-02-15 — End: 2021-05-21
  Filled 2021-02-15 – 2021-02-24 (×2): qty 30, 30d supply, fill #0

## 2021-02-15 MED ORDER — BELSOMRA 10 MG PO TABS
1.0000 | ORAL_TABLET | Freq: Every day | ORAL | 0 refills | Status: DC
  Filled 2021-02-15 – 2021-02-24 (×2): qty 30, 30d supply, fill #0

## 2021-02-16 ENCOUNTER — Ambulatory Visit: Payer: No Typology Code available for payment source | Admitting: Internal Medicine

## 2021-02-16 NOTE — Progress Notes (Deleted)
HPI  Pt presents to the clinic today with c/o burning with urination. She has a history of postcoital UTI, managed with Cipro after intercourse.   Review of Systems  Past Medical History:  Diagnosis Date   Anemia    Anxiety    claustrphobia   Chicken pox    GERD (gastroesophageal reflux disease)    IBS (irritable bowel syndrome)    Lupus (HCC)    Raynaud disease    Sjogrens syndrome (HCC)     Family History  Problem Relation Age of Onset   Hyperlipidemia Mother    Hypertension Mother    Deafness Mother    Colon polyps Mother    Alcohol abuse Father    Deafness Father    Drug abuse Sister    Hypertension Sister    Drug abuse Brother    Rheum arthritis Maternal Aunt    Lupus Maternal Aunt    Rheum arthritis Maternal Grandmother    Hypertension Sister    Colon cancer Neg Hx    Esophageal cancer Neg Hx    Rectal cancer Neg Hx    Stomach cancer Neg Hx     Social History   Socioeconomic History   Marital status: Divorced    Spouse name: Not on file   Number of children: Not on file   Years of education: Not on file   Highest education level: Not on file  Occupational History   Not on file  Tobacco Use   Smoking status: Never   Smokeless tobacco: Never  Substance and Sexual Activity   Alcohol use: Yes    Alcohol/week: 0.0 standard drinks    Comment: occasional--moderate   Drug use: No   Sexual activity: Yes  Other Topics Concern   Not on file  Social History Narrative   Not on file   Social Determinants of Health   Financial Resource Strain: Not on file  Food Insecurity: Not on file  Transportation Needs: Not on file  Physical Activity: Not on file  Stress: Not on file  Social Connections: Not on file  Intimate Partner Violence: Not on file    Allergies  Allergen Reactions   Bactrim [Sulfamethoxazole-Trimethoprim] Hives   Imuran [Azathioprine] Other (See Comments)    Mental hallucinations   Penicillins      Rash and hives at age 81    Trimethoprim Hives     Constitutional: Denies fever, malaise, fatigue, headache or abrupt weight changes.   GU: Pt reports urgency, frequency and pain with urination. Denies burning sensation, blood in urine, odor or discharge. Skin: Denies redness, rashes, lesions or ulcercations.   No other specific complaints in a complete review of systems (except as listed in HPI above).    Objective:   Physical Exam  LMP 06/18/2019  Wt Readings from Last 3 Encounters:  12/31/20 154 lb (69.9 kg)  12/24/20 155 lb (70.3 kg)  05/26/20 153 lb (69.4 kg)    General: Appears her stated age, well developed, well nourished in NAD. Cardiovascular: Normal rate and rhythm. S1,S2 noted.   Pulmonary/Chest: Normal effort and positive vesicular breath sounds. No respiratory distress. No wheezes, rales or ronchi noted.  Abdomen: Soft. Normal bowel sounds. No distention or masses noted.  Tender to palpation over the bladder area. No CVA tenderness.        Assessment & Plan:   Urgency, Frequency, Dysuria secondary to   Urinalysis: Will send urine culture eRx sent if for Macrobid 100 mg BID x 5 days OK to  take AZO OTC Drink plenty of fluids  RTC as needed or if symptoms persist. Nicki Reaper, NP This visit occurred during the SARS-CoV-2 public health emergency.  Safety protocols were in place, including screening questions prior to the visit, additional usage of staff PPE, and extensive cleaning of exam room while observing appropriate contact time as indicated for disinfecting solutions.

## 2021-02-23 ENCOUNTER — Other Ambulatory Visit (HOSPITAL_COMMUNITY): Payer: Self-pay

## 2021-02-24 ENCOUNTER — Other Ambulatory Visit (HOSPITAL_COMMUNITY): Payer: Self-pay

## 2021-02-25 ENCOUNTER — Encounter (HOSPITAL_BASED_OUTPATIENT_CLINIC_OR_DEPARTMENT_OTHER): Payer: No Typology Code available for payment source | Admitting: Pulmonary Disease

## 2021-03-07 ENCOUNTER — Other Ambulatory Visit: Payer: Self-pay

## 2021-03-07 ENCOUNTER — Ambulatory Visit (HOSPITAL_BASED_OUTPATIENT_CLINIC_OR_DEPARTMENT_OTHER): Payer: No Typology Code available for payment source | Attending: Pulmonary Disease | Admitting: Pulmonary Disease

## 2021-03-07 DIAGNOSIS — R400 Somnolence: Secondary | ICD-10-CM | POA: Diagnosis present

## 2021-03-07 DIAGNOSIS — G4733 Obstructive sleep apnea (adult) (pediatric): Secondary | ICD-10-CM | POA: Diagnosis not present

## 2021-03-07 DIAGNOSIS — R0683 Snoring: Secondary | ICD-10-CM | POA: Diagnosis not present

## 2021-03-14 ENCOUNTER — Telehealth: Payer: Self-pay | Admitting: Pulmonary Disease

## 2021-03-14 NOTE — Telephone Encounter (Signed)
Call patient  Sleep study result  Date of study: 03/07/2021  Impression: Negative study for significant obstructive sleep apnea  Recommendation:  Follow-up as previously scheduled  Sleep position optimization by encouraging sleep in a lateral position, elevating the head of the bed by about 30 degrees may help noted snoring  Encourage regular exercise   Adequate number of hours of sleep, 6 to 8 hours of sleep recommended  Caution against driving when sleepy & against medications with sedative side effects

## 2021-03-14 NOTE — Procedures (Signed)
POLYSOMNOGRAPHY  Last, First: Cheryl, Williams MRN: [id number] Gender: Female Age (years): 55 Weight (lbs): 153 DOB: 1966/02/13 BMI: 24 Primary Care: No PCP Epworth Score: 3 Referring: Tomma Lightning MD Technician: Rosette Reveal Interpreting: Tomma Lightning MD Study Type: NPSG Ordered Study Type: Split Night CPAP Study date: 03/07/2021 Location: [place] CLINICAL INFORMATION Cheryl Williams is a 55 year old Female and was referred to the sleep center for evaluation of G47.33 OSA: Adult and Pediatric (327.23). Indications include N/A.  MEDICATIONS Patient self administered medications include: belsomra. Medications administered during study include No sleep medicine administered.  SLEEP STUDY TECHNIQUE A multi-channel overnight Polysomnography study was performed. The channels recorded and monitored were central and occipital EEG, electrooculogram (EOG), submentalis EMG (chin), nasal and oral airflow, thoracic and abdominal wall motion, anterior tibialis EMG, snore microphone, electrocardiogram, and a pulse oximetry. TECHNICIAN COMMENTS Comments added by Technician: None Comments added by Scorer: N/A SLEEP ARCHITECTURE The study was initiated at 10:03:39 PM and terminated at 5:04:54 AM. The total recorded time was 421.2 minutes. EEG confirmed total sleep time was 242.1 minutes yielding a sleep efficiency of 57.5%%. Sleep onset after lights out was 121.6 minutes with a REM latency of 77.0 minutes. The patient spent 5.2%% of the night in stage N1 sleep, 69.6%% in stage N2 sleep, 0.0%% in stage N3 and 25.2% in REM. Wake after sleep onset (WASO) was 57.5 minutes. The Arousal Index was 5.9/hour. RESPIRATORY PARAMETERS There were a total of 2 respiratory disturbances out of which 1 were apneas ( 1 obstructive, 0 mixed, 0 central) and 1 hypopneas. The apnea/hypopnea index (AHI) was 0.5 events/hour. The central sleep apnea index was 0 events/hour. The REM AHI was 0.0  events/hour and NREM AHI was 0.7 events/hour. The supine AHI was 0.0 events/hour and the non supine AHI was 0.5 supine during 2.68% of sleep. Respiratory disturbances were associated with oxygen desaturation down to a nadir of 92.0% during sleep. The mean oxygen saturation during the study was 97.0%. The cumulative time under 88% oxygen saturation was 5.5 minutes.  LEG MOVEMENT DATA The total leg movements were 0 with a resulting leg movement index of 0.0/hr .Associated arousal with leg movement index was 0.0/hr.  CARDIAC DATA The underlying cardiac rhythm was most consistent with sinus rhythm. Mean heart rate during sleep was 72.1 bpm. Additional rhythm abnormalities include None.  IMPRESSIONS - No Significant Obstructive Sleep apnea(OSA) - EKG showed no cardiac abnormalities. - No significant Oxygen Desaturation - The patient snored with soft snoring volume. - No significant periodic leg movements(PLMs) during sleep. However, no significant associated arousals.  DIAGNOSIS - Daytime fatigue - Negative study for significant obstructive sleep apnea  RECOMMENDATIONS - Avoid alcohol, sedatives and other CNS depressants that may worsen sleep apnea and disrupt normal sleep architecture. - Sleep hygiene should be reviewed to assess factors that may improve sleep quality. - Weight management and regular exercise should be initiated or continued.  [Electronically signed] 03/14/2021 05:14 PM  Virl Diamond MD NPI: [id number]

## 2021-03-15 NOTE — Telephone Encounter (Signed)
I called the patient and gave her the results per Dr. Val Eagle and she voices understanding. Nothing further needed.

## 2021-03-19 ENCOUNTER — Encounter: Payer: Self-pay | Admitting: Internal Medicine

## 2021-03-19 ENCOUNTER — Ambulatory Visit (INDEPENDENT_AMBULATORY_CARE_PROVIDER_SITE_OTHER): Payer: No Typology Code available for payment source | Admitting: Internal Medicine

## 2021-03-19 ENCOUNTER — Other Ambulatory Visit: Payer: Self-pay

## 2021-03-19 VITALS — BP 104/76 | HR 79 | Resp 15 | Ht 67.5 in | Wt 157.0 lb

## 2021-03-19 DIAGNOSIS — N952 Postmenopausal atrophic vaginitis: Secondary | ICD-10-CM

## 2021-03-19 DIAGNOSIS — R300 Dysuria: Secondary | ICD-10-CM

## 2021-03-19 LAB — POCT URINALYSIS DIPSTICK
Bilirubin, UA: NEGATIVE
Blood, UA: NEGATIVE
Glucose, UA: NEGATIVE
Ketones, UA: NEGATIVE
Leukocytes, UA: NEGATIVE
Nitrite, UA: NEGATIVE
Protein, UA: NEGATIVE
Spec Grav, UA: 1.015 (ref 1.010–1.025)
Urobilinogen, UA: 0.2 E.U./dL
pH, UA: 6 (ref 5.0–8.0)

## 2021-03-19 NOTE — Progress Notes (Signed)
HPI  Pt presents to the clinic today with c/o intermittent dysuria and burning sensation.  She reports this started 6 weeks ago.  She denies urgency, frequency, blood in urine, odor or cloudiness.  She denies vaginal complaints.  She has a history of postcoital UTIs managed with Ciprofloxacin after intercourse.  She is postmenopausal and has been prescribed estrogen cream topically but has not started this yet.   Review of Systems  Past Medical History:  Diagnosis Date   Anemia    Anxiety    claustrphobia   Chicken pox    GERD (gastroesophageal reflux disease)    IBS (irritable bowel syndrome)    Lupus (HCC)    Raynaud disease    Sjogrens syndrome (HCC)     Family History  Problem Relation Age of Onset   Hyperlipidemia Mother    Hypertension Mother    Deafness Mother    Colon polyps Mother    Alcohol abuse Father    Deafness Father    Drug abuse Sister    Hypertension Sister    Drug abuse Brother    Rheum arthritis Maternal Aunt    Lupus Maternal Aunt    Rheum arthritis Maternal Grandmother    Hypertension Sister    Colon cancer Neg Hx    Esophageal cancer Neg Hx    Rectal cancer Neg Hx    Stomach cancer Neg Hx     Social History   Socioeconomic History   Marital status: Divorced    Spouse name: Not on file   Number of children: Not on file   Years of education: Not on file   Highest education level: Not on file  Occupational History   Not on file  Tobacco Use   Smoking status: Never   Smokeless tobacco: Never  Substance and Sexual Activity   Alcohol use: Yes    Alcohol/week: 0.0 standard drinks    Comment: occasional--moderate   Drug use: No   Sexual activity: Yes  Other Topics Concern   Not on file  Social History Narrative   Not on file   Social Determinants of Health   Financial Resource Strain: Not on file  Food Insecurity: Not on file  Transportation Needs: Not on file  Physical Activity: Not on file  Stress: Not on file  Social  Connections: Not on file  Intimate Partner Violence: Not on file    Allergies  Allergen Reactions   Bactrim [Sulfamethoxazole-Trimethoprim] Hives   Imuran [Azathioprine] Other (See Comments)    Mental hallucinations   Penicillins      Rash and hives at age 67   Trimethoprim Hives     Constitutional: Denies fever, malaise, fatigue, headache or abrupt weight changes.   GU: Pt reports urgency and pain with urination. Denies burning sensation, blood in urine, odor or discharge. Skin: Denies redness, rashes, lesions or ulcercations.   No other specific complaints in a complete review of systems (except as listed in HPI above).    Objective:   Physical Exam  BP 104/76 (BP Location: Left Arm, Patient Position: Sitting, Cuff Size: Normal)   Pulse 79   Resp 15   Ht 5' 7.5" (1.715 m)   Wt 157 lb (71.2 kg)   LMP 06/18/2019   SpO2 98%   BMI 24.23 kg/m   Wt Readings from Last 3 Encounters:  03/07/21 153 lb (69.4 kg)  12/31/20 154 lb (69.9 kg)  12/24/20 155 lb (70.3 kg)    General: Appears her stated age, well developed,  well nourished in NAD. Cardiovascular: Normal rate.  Pulmonary/Chest: Normal effort.         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 Nicki Reaper, NP

## 2021-03-19 NOTE — Patient Instructions (Signed)
Atrophic Vaginitis  Atrophic vaginitis is when the lining of the vagina becomes dry and thin. This is most common in women who have stopped having their periods (are in menopause). It usually starts when a woman is 45 to 53 years old. What are the causes? This condition is caused by a drop in a female hormone (estrogen). What increases the risk? You are more likely to develop this condition if: You take certain medicines. You have had your ovaries taken out. You are being treated for cancer. You have given birth or are breastfeeding. You are more than 53 years old. You smoke. What are the signs or symptoms? Pain during sex. A feeling of pressure during sex. Bleeding during sex. Burning or itching in the vagina. Burning pain when you pee (urinate). Fluid coming from your vagina. Some people do not have symptoms. How is this treated? Using a lubricant before sex. Using a moisturizer in the vagina. Using estrogen in the vagina. In some cases, you may not need treatment. Follow these instructions at home: Medicines Take all medicines only as told by your doctor. This includes medicines for dryness. Do not use herbal medicines unless your doctor says it is okay. General instructions Talk with your doctor about treatment. Do not douche. Do not use scented: Sprays. Tampons. Soaps. If sex hurts, try using lubricants right before you have sex. Contact a doctor if: You have fluid coming from the vagina that is not like normal. You have a bad smell coming from your vagina. You have new symptoms. Your symptoms do not get better when treated. Your symptoms get worse. Summary This condition happens when the lining of the vagina becomes dry and thin. It is most common in women who no longer have periods. Treatment may include using medicines for dryness. Call a doctor if your symptoms do not get better. This information is not intended to replace advice given to you by your health  care provider. Make sure you discuss any questions you have with your healthcare provider. Document Revised: 10/03/2019 Document Reviewed: 10/03/2019 Elsevier Patient Education  2022 [employer]  

## 2021-03-23 ENCOUNTER — Other Ambulatory Visit: Payer: Self-pay | Admitting: Internal Medicine

## 2021-03-23 NOTE — Telephone Encounter (Signed)
Requested medications are due for refill today.  Unsure  Requested medications are on the active medications list.  yes  Last refill. 02/15/2021  Future visit scheduled.   yes  Notes to clinic.  Medication not assigned to a protocol.    Requested Prescriptions  Pending Prescriptions Disp Refills   Suvorexant (BELSOMRA) 10 MG TABS 30 tablet 0    Sig: Take 1 tablet by mouth at bedtime.     Off-Protocol Failed - 03/23/2021 10:03 PM      Failed - Medication not assigned to a protocol, review manually.      Passed - Valid encounter within last 12 months    Recent Outpatient Visits           4 days ago Burning with urination   [facility] [place], Salvadore Oxford, NP   5 months ago Snoring   [facility] [place], Salvadore Oxford, NP       Future Appointments             In 1 month Tomma Lightning, MD [facility]   In 4 months Baity, Salvadore Oxford, NP [facility], [facility]

## 2021-03-23 NOTE — Telephone Encounter (Signed)
Requested medications are due for refill today.  unsure  Requested medications are on the active medications list.  yes  Last refill. 12/16/2020  Future visit scheduled.   yes  Notes to clinic.  Medication not assigned to a protocol.    Requested Prescriptions  Pending Prescriptions Disp Refills   ciprofloxacin (CIPRO) 250 MG tablet 30 tablet 0    Sig: Take 1 tablet (250 mg total) by mouth once after intercourse     Off-Protocol Failed - 03/23/2021 10:31 PM      Failed - Medication not assigned to a protocol, review manually.      Passed - Valid encounter within last 12 months    Recent Outpatient Visits           4 days ago Burning with urination   [facility] [place], Salvadore Oxford, NP   5 months ago Snoring   [facility] [place], Salvadore Oxford, NP       Future Appointments             In 1 month Tomma Lightning, MD [facility]   In 4 months Baity, Salvadore Oxford, NP [facility], [facility]

## 2021-03-24 ENCOUNTER — Other Ambulatory Visit: Payer: Self-pay

## 2021-03-24 ENCOUNTER — Other Ambulatory Visit (HOSPITAL_COMMUNITY): Payer: Self-pay

## 2021-03-24 MED ORDER — BELSOMRA 10 MG PO TABS
1.0000 | ORAL_TABLET | Freq: Every day | ORAL | 0 refills | Status: DC
  Filled 2021-03-24: qty 30, 30d supply, fill #0

## 2021-03-24 MED ORDER — CIPROFLOXACIN HCL 250 MG PO TABS
250.0000 mg | ORAL_TABLET | ORAL | 0 refills | Status: DC
  Filled 2021-03-24: qty 30, 30d supply, fill #0

## 2021-03-30 ENCOUNTER — Other Ambulatory Visit: Payer: Self-pay

## 2021-03-30 ENCOUNTER — Other Ambulatory Visit (HOSPITAL_COMMUNITY): Payer: Self-pay

## 2021-03-31 ENCOUNTER — Other Ambulatory Visit (HOSPITAL_COMMUNITY): Payer: Self-pay

## 2021-03-31 MED ORDER — HYDROXYCHLOROQUINE SULFATE 200 MG PO TABS
200.0000 mg | ORAL_TABLET | Freq: Every day | ORAL | 0 refills | Status: DC
  Filled 2021-03-31: qty 90, 90d supply, fill #0

## 2021-04-01 ENCOUNTER — Other Ambulatory Visit (HOSPITAL_COMMUNITY): Payer: Self-pay

## 2021-05-03 ENCOUNTER — Ambulatory Visit: Payer: No Typology Code available for payment source | Admitting: Pulmonary Disease

## 2021-05-18 ENCOUNTER — Other Ambulatory Visit: Payer: Self-pay | Admitting: Internal Medicine

## 2021-05-18 DIAGNOSIS — Z1231 Encounter for screening mammogram for malignant neoplasm of breast: Secondary | ICD-10-CM

## 2021-05-21 ENCOUNTER — Other Ambulatory Visit (HOSPITAL_COMMUNITY): Payer: Self-pay

## 2021-05-21 ENCOUNTER — Other Ambulatory Visit: Payer: Self-pay | Admitting: Internal Medicine

## 2021-05-21 DIAGNOSIS — A609 Anogenital herpesviral infection, unspecified: Secondary | ICD-10-CM

## 2021-05-21 MED ORDER — VALACYCLOVIR HCL 1 G PO TABS
500.0000 mg | ORAL_TABLET | ORAL | 0 refills | Status: DC
  Filled 2021-05-21: qty 90, 90d supply, fill #0

## 2021-05-21 MED ORDER — BELSOMRA 10 MG PO TABS
1.0000 | ORAL_TABLET | Freq: Every day | ORAL | 0 refills | Status: DC
  Filled 2021-05-21: qty 30, 30d supply, fill #0

## 2021-05-21 MED FILL — Omeprazole Cap Delayed Release 20 MG: ORAL | 30 days supply | Qty: 30 | Fill #1 | Status: AC

## 2021-05-21 NOTE — Telephone Encounter (Signed)
Requested medication (s) are due for refill today: yes  Requested medication (s) are on the active medication list: yes  Last refill:  03/24/21 #30/0  Future visit scheduled: yes  Notes to clinic:  Unable to refill per protocol, medication not assigned to the refill protocol.    Requested Prescriptions  Pending Prescriptions Disp Refills   Suvorexant (BELSOMRA) 10 MG TABS 30 tablet 0    Sig: Take 1 tablet by mouth at bedtime.     Off-Protocol Failed - 05/21/2021  8:58 AM      Failed - Medication not assigned to a protocol, review manually.      Passed - Valid encounter within last 12 months    Recent Outpatient Visits           2 months ago Burning with urination   [facility] [place], Salvadore Oxford, NP   7 months ago Snoring   [facility], Salvadore Oxford, NP       Future Appointments             In 1 month Tomma Lightning, MD [facility]   In 2 months [place], Salvadore Oxford, NP [facility], [facility]            Signed Prescriptions Disp Refills   valACYclovir (VALTREX) 1000 MG tablet 90 tablet 0    Sig: Take 0.5 tablets (500 mg total) by mouth twice daily for 3 days for recurrent outbreaks and 1 tablet daily for suppressive therapy     Antimicrobials:  Antiviral Agents - Anti-Herpetic Passed - 05/21/2021  8:58 AM      Passed - Valid encounter within last 12 months    Recent Outpatient Visits           2 months ago Burning with urination   [facility] [place], Salvadore Oxford, NP   7 months ago Snoring   [facility] [place], Salvadore Oxford, NP       Future Appointments             In 1 month Tomma Lightning, MD [facility]   In 2 months Baity, Salvadore Oxford, NP [facility], [facility]

## 2021-05-21 NOTE — Telephone Encounter (Signed)
Requested Prescriptions  Pending Prescriptions Disp Refills   valACYclovir (VALTREX) 1000 MG tablet 30 tablet 0    Sig: Take 0.5 tablets (500 mg total) by mouth twice daily for 3 days for recurrent outbreaks and 1 tablet daily for suppressive therapy     Antimicrobials:  Antiviral Agents - Anti-Herpetic Passed - 05/21/2021  8:58 AM      Passed - Valid encounter within last 12 months    Recent Outpatient Visits          2 months ago Burning with urination   [facility] [place], Coralie Keens, NP   7 months ago Snoring   [facility] [place], Coralie Keens, NP      Future Appointments            In 1 month Laurin Coder, MD [facility]   In 2 months [place], Coralie Keens, NP [facility], [facility]            Suvorexant (BELSOMRA) 10 MG TABS 30 tablet 0    Sig: Take 1 tablet by mouth at bedtime.     Off-Protocol Failed - 05/21/2021  8:58 AM      Failed - Medication not assigned to a protocol, review manually.      Passed - Valid encounter within last 12 months    Recent Outpatient Visits          2 months ago Burning with urination   [place], NP   7 months ago [facility] [place], Coralie Keens, NP      Future Appointments            In 1 month Laurin Coder, MD [facility]   In 2 months Baity, Coralie Keens, NP [facility], [facility]

## 2021-07-13 ENCOUNTER — Other Ambulatory Visit: Payer: Self-pay

## 2021-07-13 ENCOUNTER — Ambulatory Visit (INDEPENDENT_AMBULATORY_CARE_PROVIDER_SITE_OTHER): Payer: No Typology Code available for payment source | Admitting: Pulmonary Disease

## 2021-07-13 ENCOUNTER — Encounter: Payer: Self-pay | Admitting: Pulmonary Disease

## 2021-07-13 VITALS — BP 110/64 | HR 76 | Temp 98.2°F | Ht 67.5 in | Wt 154.0 lb

## 2021-07-13 DIAGNOSIS — G4733 Obstructive sleep apnea (adult) (pediatric): Secondary | ICD-10-CM | POA: Diagnosis not present

## 2021-07-13 NOTE — Progress Notes (Signed)
? ?      ?PROVIDENCIA HOTTENSTEIN    [id number]    08/17/1965 ? ?Primary Care Physician:Baity, Salvadore Oxford, NP ? ?Referring Physician: Lorre Munroe, NP ?[address] ?[place],  [place] [address] ? ?Chief complaint:   ?Patient being seen for snoring, daytime fatigue ? ?HPI: ? ?Patient had a sleep study performed 03/07/2021 showing a negative study for significant sleep disordered breathing ?Sleep efficiency was 57% ? ?She felt she was unable to sleep well because of the bed and environment ? ?Patient fell sleep at night was not reflective of what she does on a regular basis and still has significant symptoms of snoring, dryness of her mouth, nonrestorative sleep ?Daytime sleepiness ?-Study was reviewed ? ?Reports increased snoring, daytime fatigue ?History of lupus and Sjogren's ?Mouth dryness in the morning ?Still feels fatigued despite adequate number of hours of sleep ? ?Nonrestorative sleep ?Usually goes to bed between 11 and 1 AM ?Falls asleep in a few minutes 2-3 awakenings ?Usually wakes up about 8 AM ?Weight has been stable ?She is menopausal ?Never smoker ? ?Mom has obstructive sleep apnea ? ? ?Outpatient Encounter Medications as of 07/13/2021  ?Medication Sig  ? ciprofloxacin (CIPRO) 250 MG tablet Take 1 tablet (250 mg total) by mouth once after intercourse  ? hydroxychloroquine (PLAQUENIL) 200 MG tablet Take 1 tablet (200 mg total) by mouth daily with food or milk  ? ibuprofen (ADVIL,MOTRIN) 600 MG tablet TAKE 1 TABLET BY MOUTH 3 TIMES DAILY.  ? propranolol (INDERAL) 10 MG tablet Take 1 tablet (10 mg total) by mouth daily as needed.  ? Suvorexant (BELSOMRA) 10 MG TABS Take 1 tablet by mouth at bedtime.  ? urea (CARMOL) 40 % CREA Apply to the affected toe daily to twice daily as needed.  ? valACYclovir (VALTREX) 1000 MG tablet Take 0.5 tablets (500 mg total) by mouth twice daily for 3 days for recurrent outbreaks and 1 tablet daily for suppressive therapy  ? omeprazole (PRILOSEC) 20 MG capsule TAKE 1 CAPSULE (20  MG TOTAL) BY MOUTH DAILY.  ? [DISCONTINUED] COVID-19 mRNA Vac-TriS, Pfizer, (PFIZER-BIONT COVID-19 VAC-TRIS) SUSP injection Inject into the muscle. (Patient not taking: Reported on 07/13/2021)  ? [DISCONTINUED] influenza vac split quadrivalent PF (FLUARIX) 0.5 ML injection Inject into the muscle. (Patient not taking: Reported on 07/13/2021)  ? [DISCONTINUED] predniSONE (STERAPRED UNI-PAK 21 TAB) 10 MG (21) TBPK tablet Use as directed per doctors orders (Patient not taking: Reported on 07/13/2021)  ? ?No facility-administered encounter medications on file as of 07/13/2021.  ? ? ?Allergies as of 07/13/2021 - Review Complete 07/13/2021  ?Allergen Reaction Noted  ? Bactrim [sulfamethoxazole-trimethoprim] Hives 10/24/2019  ? Trimethoprim Hives 10/24/2019  ? Imuran [azathioprine] Other (See Comments) 11/20/2013  ? Penicillins  03/08/2012  ? ? ?Past Medical History:  ?Diagnosis Date  ? Anemia   ? Anxiety   ? claustrphobia  ? Chicken pox   ? GERD (gastroesophageal reflux disease)   ? IBS (irritable bowel syndrome)   ? Lupus (HCC)   ? Raynaud disease   ? Sjogrens syndrome (HCC)   ? ? ?Past Surgical History:  ?Procedure Laterality Date  ? BUNIONECTOMY Left   ? [facility]  ? CHOLECYSTECTOMY  2006  ? ? ?Family History  ?Problem Relation Age of Onset  ? Hyperlipidemia Mother   ? Hypertension Mother   ? Deafness Mother   ? Colon polyps Mother   ? Alcohol abuse Father   ? Deafness Father   ? Drug abuse Sister   ?  Hypertension Sister   ? Drug abuse Brother   ? Rheum arthritis Maternal Aunt   ? Lupus Maternal Aunt   ? Rheum arthritis Maternal Grandmother   ? Hypertension Sister   ? Colon cancer Neg Hx   ? Esophageal cancer Neg Hx   ? Rectal cancer Neg Hx   ? Stomach cancer Neg Hx   ? ? ?Social History  ? ?Socioeconomic History  ? Marital status: Divorced  ?  Spouse name: Not on file  ? Number of children: Not on file  ? Years of education: Not on file  ? Highest education level: Not on file  ?Occupational History  ? Not on file   ?Tobacco Use  ? Smoking status: Never  ? Smokeless tobacco: Never  ?Substance and Sexual Activity  ? Alcohol use: Yes  ?  Alcohol/week: 0.0 standard drinks  ?  Comment: occasional--moderate  ? Drug use: No  ? Sexual activity: Yes  ?Other Topics Concern  ? Not on file  ?Social History Narrative  ? Not on file  ? ?Social Determinants of Health  ? ?Financial Resource Strain: Not on file  ?Food Insecurity: Not on file  ?Transportation Needs: Not on file  ?Physical Activity: Not on file  ?Stress: Not on file  ?Social Connections: Not on file  ?Intimate Partner Violence: Not on file  ? ? ?Review of Systems  ?Constitutional:  Positive for fatigue.  ?Respiratory:  Negative for shortness of breath.   ?Psychiatric/Behavioral:  Positive for sleep disturbance.   ? ?Vitals:  ? 07/13/21 1203  ?BP: 110/64  ?Pulse: 76  ?Temp: 98.2 ?F (36.8 ?C)  ?SpO2: 96%  ? ? ? ?Physical Exam ?Constitutional:   ?   Appearance: Normal appearance. She is normal weight.  ?HENT:  ?   Head: Normocephalic.  ?   Right Ear: There is no impacted cerumen.  ?   Nose: No congestion.  ?   Mouth/Throat:  ?   Mouth: Mucous membranes are moist.  ?   Comments: Mallampati 3, ?Eyes:  ?   Pupils: Pupils are equal, round, and reactive to light.  ?Cardiovascular:  ?   Rate and Rhythm: Normal rate and regular rhythm.  ?   Heart sounds: No murmur heard. ?  No friction rub.  ?Pulmonary:  ?   Effort: No respiratory distress.  ?   Breath sounds: No stridor. No wheezing or rhonchi.  ?Musculoskeletal:  ?   Cervical back: No rigidity or tenderness.  ?Neurological:  ?   Mental Status: She is alert.  ?Psychiatric:     ?   Mood and Affect: Mood normal.  ? ? ?Epworth of zero ? ?Data Reviewed: ?Care everywhere reviewed ? ?Sleep study reviewed showing reduced sleep efficiency, negative study for significant sleep disordered breathing ? ?Assessment:  ?Negative study for sleep disordered breathing however, patient feels sleep was not reflective of what she does on a nightly  basis ?-Still has significant daytime symptoms ?-Nonrestorative sleep ? ?History of Sjogren's, history of lupus ? ?Daytime fatigue ? ?Pathophysiology of sleep disordered breathing discussed with the patient ?Treatment options for sleep disordered breathing discussed with the patient ? ?Plan/Recommendations: ?Repeat in lab polysomnogram ? ?She is aware that if study is negative, it may mean that she does not have significant sleep disordered breathing that puts her in harm's way ? ?Sleep position modification for snoring encouraged ? ?Tentative follow-up in 3 to 4 months ? ?Treatment options for significant sleep disordered breathing reviewed ? ? ?Virl Diamond MD ?[facility] ?07/13/2021, 12:51 PM ? ?CC: Lorre MunroeBaity, Regina W, NP ? ? ? ?

## 2021-07-13 NOTE — Patient Instructions (Signed)
Repeat in lab sleep study ? ?Persistent symptoms with a  falsely negative study ? ?I will see you back in about 3 to 4 months ? ?Call with significant concerns ?

## 2021-07-29 ENCOUNTER — Encounter: Payer: No Typology Code available for payment source | Admitting: Internal Medicine

## 2021-07-29 DIAGNOSIS — D709 Neutropenia, unspecified: Secondary | ICD-10-CM | POA: Insufficient documentation

## 2021-07-29 NOTE — Progress Notes (Deleted)
? ?Subjective:  ? ? Patient ID: Cheryl Williams, female    DOB: 12-10-1965, 56 y.o.   MRN: [id number] ? ?HPI ? ?Patient presents to clinic today for annual exam. ? ?Flu: 12/2020 ?Tetanus: ?COVID: Pfizer x4 ?Shingrix: ?Pap smear: 01/2017 ?Mammogram: 07/2020 ?Colon screening: 04/2018 ?Vision screening: ?Dentist: ? ?Diet: ?Exercise: ? ?Review of Systems ? ?   ?Past Medical History:  ?Diagnosis Date  ? Anemia   ? Anxiety   ? claustrphobia  ? Chicken pox   ? GERD (gastroesophageal reflux disease)   ? IBS (irritable bowel syndrome)   ? Lupus ([place])   ? Raynaud disease   ? Sjogrens syndrome ([place])   ? ? ?Current Outpatient Medications  ?Medication Sig Dispense Refill  ? ciprofloxacin (CIPRO) 250 MG tablet Take 1 tablet (250 mg total) by mouth once after intercourse 30 tablet 0  ? hydroxychloroquine (PLAQUENIL) 200 MG tablet Take 1 tablet (200 mg total) by mouth daily with food or milk 90 tablet 0  ? ibuprofen (ADVIL,MOTRIN) 600 MG tablet TAKE 1 TABLET BY MOUTH 3 TIMES DAILY. 60 tablet 5  ? omeprazole (PRILOSEC) 20 MG capsule TAKE 1 CAPSULE (20 MG TOTAL) BY MOUTH DAILY. 30 capsule 3  ? propranolol (INDERAL) 10 MG tablet Take 1 tablet (10 mg total) by mouth daily as needed. 30 tablet 1  ? Suvorexant (BELSOMRA) 10 MG TABS Take 1 tablet by mouth at bedtime. 30 tablet 0  ? urea (CARMOL) 40 % CREA Apply to the affected toe daily to twice daily as needed. 28.35 g 11  ? valACYclovir (VALTREX) 1000 MG tablet Take 0.5 tablets (500 mg total) by mouth twice daily for 3 days for recurrent outbreaks and 1 tablet daily for suppressive therapy 90 tablet 0  ? ?No current facility-administered medications for this visit.  ? ? ?Allergies  ?Allergen Reactions  ? Bactrim [Sulfamethoxazole-Trimethoprim] Hives  ? Trimethoprim Hives  ? Imuran [Azathioprine] Other (See Comments)  ?  Mental hallucinations  ? Penicillins   ?   Rash and hives at age 60  ? ? ?Family History  ?Problem Relation Age of Onset  ? Hyperlipidemia Mother   ? Hypertension  Mother   ? Deafness Mother   ? Colon polyps Mother   ? Alcohol abuse Father   ? Deafness Father   ? Drug abuse Sister   ? Hypertension Sister   ? Drug abuse Brother   ? Rheum arthritis Maternal Aunt   ? Lupus Maternal Aunt   ? Rheum arthritis Maternal Grandmother   ? Hypertension Sister   ? Colon cancer Neg Hx   ? Esophageal cancer Neg Hx   ? Rectal cancer Neg Hx   ? Stomach cancer Neg Hx   ? ? ?Social History  ? ?Socioeconomic History  ? Marital status: Divorced  ?  Spouse name: Not on file  ? Number of children: Not on file  ? Years of education: Not on file  ? Highest education level: Not on file  ?Occupational History  ? Not on file  ?Tobacco Use  ? Smoking status: Never  ? Smokeless tobacco: Never  ?Substance and Sexual Activity  ? Alcohol use: Yes  ?  Alcohol/week: 0.0 standard drinks  ?  Comment: occasional--moderate  ? Drug use: No  ? Sexual activity: Yes  ?Other Topics Concern  ? Not on file  ?Social History Narrative  ? Not on file  ? ?Social Determinants of Health  ? ?Financial Resource Strain: Not on file  ?Food Insecurity: Not on  file  ?Transportation Needs: Not on file  ?Physical Activity: Not on file  ?Stress: Not on file  ?Social Connections: Not on file  ?Intimate Partner Violence: Not on file  ? ? ? ?Constitutional: Denies fever, malaise, fatigue, headache or abrupt weight changes.  ?HEENT: Denies eye pain, eye redness, ear pain, ringing in the ears, wax buildup, runny nose, nasal congestion, bloody nose, or sore throat. ?Respiratory: Denies difficulty breathing, shortness of breath, cough or sputum production.   ?Cardiovascular: Denies chest pain, chest tightness, palpitations or swelling in the hands or feet.  ?Gastrointestinal: Denies abdominal pain, bloating, constipation, diarrhea or blood in the stool.  ?GU: Denies urgency, frequency, pain with urination, burning sensation, blood in urine, odor or discharge. ?Musculoskeletal: Denies decrease in range of motion, difficulty with gait, muscle  pain or joint pain and swelling.  ?Skin: Denies redness, rashes, lesions or ulcercations.  ?Neurological: Patient reports insomnia.  Denies dizziness, difficulty with memory, difficulty with speech or problems with balance and coordination.  ?Psych: Patient has a history of anxiety.  Denies depression, SI/HI. ? ?No other specific complaints in a complete review of systems (except as listed in HPI above). ? ?Objective:  ? Physical Exam ? ?LMP 06/18/2019  ?Wt Readings from Last 3 Encounters:  ?07/13/21 154 lb (69.9 kg)  ?03/19/21 157 lb (71.2 kg)  ?03/07/21 153 lb (69.4 kg)  ? ? ?General: Appears their stated age, well developed, well nourished in NAD. ?Skin: Warm, dry and intact. No rashes, lesions or ulcerations noted. ?HEENT: Head: normal shape and size; Eyes: sclera white, no icterus, conjunctiva pink, PERRLA and EOMs intact; Ears: Tm's gray and intact, normal light reflex; Nose: mucosa pink and moist, septum midline; Throat/Mouth: Teeth present, mucosa pink and moist, no exudate, lesions or ulcerations noted.  ?Neck:  Neck supple, trachea midline. No masses, lumps or thyromegaly present.  ?Cardiovascular: Normal rate and rhythm. S1,S2 noted.  No murmur, rubs or gallops noted. No JVD or BLE edema. No carotid bruits noted. ?Pulmonary/Chest: Normal effort and positive vesicular breath sounds. No respiratory distress. No wheezes, rales or ronchi noted.  ?Abdomen: Soft and nontender. Normal bowel sounds. No distention or masses noted. Liver, spleen and kidneys non palpable. ?Musculoskeletal: Normal range of motion. No signs of joint swelling. No difficulty with gait.  ?Neurological: Alert and oriented. Cranial nerves II-XII grossly intact. Coordination normal.  ?Psychiatric: Mood and affect normal. Behavior is normal. Judgment and thought content normal.  ? ? ?BMET ?   ?Component Value Date/Time  ? NA 139 05/26/2020 0955  ? K 3.7 05/26/2020 0955  ? CL 102 05/26/2020 0955  ? CO2 32 05/26/2020 0955  ? GLUCOSE 82  05/26/2020 0955  ? BUN 14 05/26/2020 0955  ? CREATININE 0.81 05/26/2020 0955  ? CREATININE 1.09 02/03/2017 1650  ? CALCIUM 9.7 05/26/2020 0955  ? ? ?Lipid Panel  ?   ?Component Value Date/Time  ? CHOL 223 (H) 05/26/2020 0955  ? TRIG 72.0 05/26/2020 0955  ? HDL 67.40 05/26/2020 0955  ? CHOLHDL 3 05/26/2020 0955  ? VLDL 14.4 05/26/2020 0955  ? [place] 141 (H) 05/26/2020 0955  ? LDLCALC 107 (H) 02/03/2017 1650  ? ? ?CBC ?   ?Component Value Date/Time  ? WBC 3.0 (L) 05/26/2020 0955  ? RBC 3.61 (L) 05/26/2020 0955  ? HGB 10.5 (L) 05/26/2020 0955  ? HCT 32.1 (L) 05/26/2020 0955  ? PLT 214.0 05/26/2020 0955  ? MCV 88.8 05/26/2020 0955  ? MCH 29.1 02/03/2017 1650  ? MCHC 32.9 05/26/2020  0955  ? RDW 13.9 05/26/2020 0955  ? ? ?Hgb A1C ?No results found for: HGBA1C ? ? ? ? ? ? ?   ?Assessment & Plan:  ? ?Preventative Health Maintenance: ? ?Encouraged her to get a flu shot in the fall ?Tetanus ?Encouraged her to get her COVID booster ?Discussed Shingrix vaccine, she will check coverage with her insurance company and schedule nurse visit if she would like to have this done ?Pap smear today, she declines STD screening ?Mammogram scheduled ?Colon screening UTD ?Encouraged her to consume a balanced diet and exercise regimen ?Advised her to see an eye doctor and dentist annually ?We will check CBC, c-Met, lipid profile today ? ?RTC in 6 months, follow-up chronic conditions ?Webb Silversmith, NP ? ?

## 2021-08-03 ENCOUNTER — Encounter: Payer: Self-pay | Admitting: Internal Medicine

## 2021-08-03 ENCOUNTER — Ambulatory Visit (INDEPENDENT_AMBULATORY_CARE_PROVIDER_SITE_OTHER): Payer: No Typology Code available for payment source | Admitting: Internal Medicine

## 2021-08-03 VITALS — BP 126/78 | HR 81 | Temp 97.5°F | Ht 67.0 in | Wt 152.0 lb

## 2021-08-03 DIAGNOSIS — Z0001 Encounter for general adult medical examination with abnormal findings: Secondary | ICD-10-CM | POA: Diagnosis not present

## 2021-08-03 NOTE — Progress Notes (Signed)
? ?Subjective:  ? ? Patient ID: Cheryl Williams, female    DOB: Nov 13, 1965, 56 y.o.   MRN: [id number] ? ?HPI ? ?Patient presents to clinic today for her annual exam. ? ?Flu: 12/2020 ?Tetanus: 10/2016 ?COVID: Pfizer x4 ?Shingrix: Never ?Pap smear: 01/2017 ?Mammogram: 07/2020 ?Colon screening: 04/2018 ?Vision screening: annually ?Dentist: biannually ? ?Diet: She does eat meat. She consumes fruits and veggies. She tries to avoid fried foods. She drinks mostly water. ?Exercise: Ride bike, weights, and cardio ? ? ? ?Review of Systems ? ?   ?Past Medical History:  ?Diagnosis Date  ? Anemia   ? Anxiety   ? claustrphobia  ? Chicken pox   ? GERD (gastroesophageal reflux disease)   ? IBS (irritable bowel syndrome)   ? Lupus ([place])   ? Raynaud disease   ? Sjogrens syndrome ([place])   ? ? ?Current Outpatient Medications  ?Medication Sig Dispense Refill  ? ciprofloxacin (CIPRO) 250 MG tablet Take 1 tablet (250 mg total) by mouth once after intercourse 30 tablet 0  ? hydroxychloroquine (PLAQUENIL) 200 MG tablet Take 1 tablet (200 mg total) by mouth daily with food or milk 90 tablet 0  ? ibuprofen (ADVIL,MOTRIN) 600 MG tablet TAKE 1 TABLET BY MOUTH 3 TIMES DAILY. 60 tablet 5  ? omeprazole (PRILOSEC) 20 MG capsule TAKE 1 CAPSULE (20 MG TOTAL) BY MOUTH DAILY. 30 capsule 3  ? propranolol (INDERAL) 10 MG tablet Take 1 tablet (10 mg total) by mouth daily as needed. 30 tablet 1  ? Suvorexant (BELSOMRA) 10 MG TABS Take 1 tablet by mouth at bedtime. 30 tablet 0  ? urea (CARMOL) 40 % CREA Apply to the affected toe daily to twice daily as needed. 28.35 g 11  ? valACYclovir (VALTREX) 1000 MG tablet Take 0.5 tablets (500 mg total) by mouth twice daily for 3 days for recurrent outbreaks and 1 tablet daily for suppressive therapy 90 tablet 0  ? ?No current facility-administered medications for this visit.  ? ? ?Allergies  ?Allergen Reactions  ? Bactrim [Sulfamethoxazole-Trimethoprim] Hives  ? Trimethoprim Hives  ? Imuran [Azathioprine] Other (See  Comments)  ?  Mental hallucinations  ? Penicillins   ?   Rash and hives at age 81  ? ? ?Family History  ?Problem Relation Age of Onset  ? Hyperlipidemia Mother   ? Hypertension Mother   ? Deafness Mother   ? Colon polyps Mother   ? Alcohol abuse Father   ? Deafness Father   ? Drug abuse Sister   ? Hypertension Sister   ? Drug abuse Brother   ? Rheum arthritis Maternal Aunt   ? Lupus Maternal Aunt   ? Rheum arthritis Maternal Grandmother   ? Hypertension Sister   ? Colon cancer Neg Hx   ? Esophageal cancer Neg Hx   ? Rectal cancer Neg Hx   ? Stomach cancer Neg Hx   ? ? ?Social History  ? ?Socioeconomic History  ? Marital status: Divorced  ?  Spouse name: Not on file  ? Number of children: Not on file  ? Years of education: Not on file  ? Highest education level: Not on file  ?Occupational History  ? Not on file  ?Tobacco Use  ? Smoking status: Never  ? Smokeless tobacco: Never  ?Substance and Sexual Activity  ? Alcohol use: Yes  ?  Alcohol/week: 0.0 standard drinks  ?  Comment: occasional--moderate  ? Drug use: No  ? Sexual activity: Yes  ?Other Topics Concern  ?  Not on file  ?Social History Narrative  ? Not on file  ? ?Social Determinants of Health  ? ?Financial Resource Strain: Not on file  ?Food Insecurity: Not on file  ?Transportation Needs: Not on file  ?Physical Activity: Not on file  ?Stress: Not on file  ?Social Connections: Not on file  ?Intimate Partner Violence: Not on file  ? ? ? ?Constitutional: Denies fever, malaise, fatigue, headache or abrupt weight changes.  ?HEENT: Denies eye pain, eye redness, ear pain, ringing in the ears, wax buildup, runny nose, nasal congestion, bloody nose, or sore throat. ?Respiratory: Denies difficulty breathing, shortness of breath, cough or sputum production.   ?Cardiovascular: Denies chest pain, chest tightness, palpitations or swelling in the hands or feet.  ?Gastrointestinal: Denies abdominal pain, bloating, constipation, diarrhea or blood in the stool.  ?GU: Denies  urgency, frequency, pain with urination, burning sensation, blood in urine, odor or discharge. ?Musculoskeletal: Denies decrease in range of motion, difficulty with gait, muscle pain or joint pain and swelling.  ?Skin: Denies redness, rashes, lesions or ulcercations.  ?Neurological: Patient reports insomnia, hot flashes.  Denies dizziness, difficulty with memory, difficulty with speech or problems with balance and coordination.  ?Psych: Patient has a history of anxiety.  Denies depression, SI/HI. ? ?No other specific complaints in a complete review of systems (except as listed in HPI above). ? ?Objective:  ? Physical Exam ?BP 126/78 (BP Location: Left Arm, Patient Position: Sitting, Cuff Size: Large)   Pulse 81   Temp (!) 97.5 ?F (36.4 ?C) (Temporal)   Ht $R'5\' 7"'Rr$  (1.702 m)   Wt 152 lb (68.9 kg)   LMP 06/18/2019   SpO2 99%   BMI 23.81 kg/m?  ? ? ?LMP 06/18/2019  ?Wt Readings from Last 3 Encounters:  ?07/13/21 154 lb (69.9 kg)  ?03/19/21 157 lb (71.2 kg)  ?03/07/21 153 lb (69.4 kg)  ? ? ?General: Appears her stated age, well developed, well nourished in NAD. ?Skin: Warm, dry and intact.  ?HEENT: Head: normal shape and size; Eyes: sclera white, no icterus, conjunctiva pink, PERRLA and EOMs intact;  ?Neck:  Neck supple, trachea midline. No masses, lumps or thyromegaly present.  ?Cardiovascular: Normal rate and rhythm. S1,S2 noted.  No murmur, rubs or gallops noted. No JVD or BLE edema. No carotid bruits noted. ?Pulmonary/Chest: Normal effort and positive vesicular breath sounds. No respiratory distress. No wheezes, rales or ronchi noted.  ?Abdomen: Soft and nontender. Normal bowel sounds. ?Musculoskeletal: Strength 5/5 BUE/BLE. No difficulty with gait.  ?Neurological: Alert and oriented. Cranial nerves II-XII grossly intact. Coordination normal.  ?Psychiatric: Mood and affect normal. Behavior is normal. Judgment and thought content normal.  ? ? ?BMET ?   ?Component Value Date/Time  ? NA 139 05/26/2020 0955  ? K 3.7  05/26/2020 0955  ? CL 102 05/26/2020 0955  ? CO2 32 05/26/2020 0955  ? GLUCOSE 82 05/26/2020 0955  ? BUN 14 05/26/2020 0955  ? CREATININE 0.81 05/26/2020 0955  ? CREATININE 1.09 02/03/2017 1650  ? CALCIUM 9.7 05/26/2020 0955  ? ? ?Lipid Panel  ?   ?Component Value Date/Time  ? CHOL 223 (H) 05/26/2020 0955  ? TRIG 72.0 05/26/2020 0955  ? HDL 67.40 05/26/2020 0955  ? CHOLHDL 3 05/26/2020 0955  ? VLDL 14.4 05/26/2020 0955  ? [place] 141 (H) 05/26/2020 0955  ? LDLCALC 107 (H) 02/03/2017 1650  ? ? ?CBC ?   ?Component Value Date/Time  ? WBC 3.0 (L) 05/26/2020 0955  ? RBC 3.61 (L) 05/26/2020 0955  ?  HGB 10.5 (L) 05/26/2020 0955  ? HCT 32.1 (L) 05/26/2020 0955  ? PLT 214.0 05/26/2020 0955  ? MCV 88.8 05/26/2020 0955  ? MCH 29.1 02/03/2017 1650  ? MCHC 32.9 05/26/2020 0955  ? RDW 13.9 05/26/2020 0955  ? ? ?Hgb A1C ?No results found for: HGBA1C ? ? ? ? ? ?   ?Assessment & Plan:  ? ?Preventative Health Maintenance: ? ?Encouraged her to get a flu shot in fall ?Tetanus UTD ?COVID-vaccine UTD ?Discussed Shingrix vaccine, she will check coverage with her insurance company and schedule nurse visit if she would like to have this done ?She declines pap smear today, will get at her next visit ?Mammogram scheduled ?Colon screening UTD ?Encouraged her to consume a balanced diet and exercise regimen ?Advised her to see an eye doctor and dentist annually ?We will check CBC, c-Met, lipid and A1c today ? ?RTC in 6 months, follow-up chronic conditions ?Webb Silversmith, NP ? ?

## 2021-08-04 LAB — CBC
HCT: 31.7 % — ABNORMAL LOW (ref 35.0–45.0)
Hemoglobin: 10.3 g/dL — ABNORMAL LOW (ref 11.7–15.5)
MCH: 28.8 pg (ref 27.0–33.0)
MCHC: 32.5 g/dL (ref 32.0–36.0)
MCV: 88.5 fL (ref 80.0–100.0)
MPV: 11.1 fL (ref 7.5–12.5)
Platelets: 213 10*3/uL (ref 140–400)
RBC: 3.58 10*6/uL — ABNORMAL LOW (ref 3.80–5.10)
RDW: 13.6 % (ref 11.0–15.0)
WBC: 3.2 10*3/uL — ABNORMAL LOW (ref 3.8–10.8)

## 2021-08-04 LAB — HEMOGLOBIN A1C
Hgb A1c MFr Bld: 6 %{Hb} — ABNORMAL HIGH
Mean Plasma Glucose: 126 mg/dL
eAG (mmol/L): 7 mmol/L

## 2021-08-04 LAB — COMPLETE METABOLIC PANEL WITH GFR
AG Ratio: 1.4 (calc) (ref 1.0–2.5)
ALT: 11 U/L (ref 6–29)
AST: 21 U/L (ref 10–35)
Albumin: 4.3 g/dL (ref 3.6–5.1)
Alkaline phosphatase (APISO): 51 U/L (ref 37–153)
BUN: 18 mg/dL (ref 7–25)
CO2: 29 mmol/L (ref 20–32)
Calcium: 9.3 mg/dL (ref 8.6–10.4)
Chloride: 106 mmol/L (ref 98–110)
Creat: 0.87 mg/dL (ref 0.50–1.03)
Globulin: 3 g/dL (calc) (ref 1.9–3.7)
Glucose, Bld: 87 mg/dL (ref 65–139)
Potassium: 3.6 mmol/L (ref 3.5–5.3)
Sodium: 142 mmol/L (ref 135–146)
Total Bilirubin: 0.3 mg/dL (ref 0.2–1.2)
Total Protein: 7.3 g/dL (ref 6.1–8.1)
eGFR: 80 mL/min/{1.73_m2} (ref >=60)

## 2021-08-04 LAB — LIPID PANEL
Cholesterol: 193 mg/dL (ref <200)
HDL: 57 mg/dL (ref >=50)
LDL Cholesterol (Calc): 114 mg/dL (calc) — ABNORMAL HIGH
Non-HDL Cholesterol (Calc): 136 mg/dL (calc) — ABNORMAL HIGH (ref <130)
Total CHOL/HDL Ratio: 3.4 (calc) (ref <5.0)
Triglycerides: 111 mg/dL (ref <150)

## 2021-08-04 NOTE — Patient Instructions (Signed)

## 2021-08-10 ENCOUNTER — Ambulatory Visit
Admission: RE | Admit: 2021-08-10 | Discharge: 2021-08-10 | Disposition: A | Discharge status: Home / Self Care | Payer: No Typology Code available for payment source | Source: Ambulatory Visit | Attending: Internal Medicine | Admitting: Internal Medicine

## 2021-08-10 DIAGNOSIS — Z1231 Encounter for screening mammogram for malignant neoplasm of breast: Secondary | ICD-10-CM

## 2021-08-11 ENCOUNTER — Other Ambulatory Visit: Payer: Self-pay | Admitting: Internal Medicine

## 2021-08-12 ENCOUNTER — Ambulatory Visit: Payer: No Typology Code available for payment source

## 2021-08-12 NOTE — Telephone Encounter (Signed)
Requested medication (s) are due for refill today: yes ? ?Requested medication (s) are on the active medication list: yes ? ?Last refill:  03/23/21 for ciprofloxacin and 05/21/21 for suvorexant  ? ?Future visit scheduled: no ? ?Notes to clinic:  Unable to refill per protocol, no protocol is attached to Rx. Review manually. ? ? ?  ?Requested Prescriptions  ?Pending Prescriptions Disp Refills  ? ciprofloxacin (CIPRO) 250 MG tablet 30 tablet 0  ?  Sig: Take 1 tablet (250 mg total) by mouth once after intercourse  ?  ? Off-Protocol Failed - 08/11/2021  2:22 PM  ?  ?  Failed - Medication not assigned to a protocol, review manually.  ?  ?  Passed - Valid encounter within last 12 months  ?  Recent Outpatient Visits   ? ?      ? 1 week ago Encounter for general adult medical examination with abnormal findings  ? [facility] [place], [place] W, NP  ? 4 months ago Burning with urination  ? [facility] [place], [place] W, NP  ? 10 months ago Snoring  ? [facility] [place], Salvadore Oxford, NP  ? ?  ?  ? ? ?  ?  ?  ? Suvorexant (BELSOMRA) 10 MG TABS 30 tablet 0  ?  Sig: Take 1 tablet by mouth at bedtime.  ?  ? Off-Protocol Failed - 08/11/2021  2:22 PM  ?  ?  Failed - Medication not assigned to a protocol, review manually.  ?  ?  Passed - Valid encounter within last 12 months  ?  Recent Outpatient Visits   ? ?      ? 1 week ago Encounter for general adult medical examination with abnormal findings  ? [facility] [place], [place] W, NP  ? 4 months ago Burning with urination  ? [facility] [place], [place] W, NP  ? 10 months ago Snoring  ? [facility] [place], Salvadore Oxford, NP  ? ?  ?  ? ? ?  ?  ?  ? ? ?

## 2021-08-13 MED ORDER — BELSOMRA 10 MG PO TABS
1.0000 | ORAL_TABLET | Freq: Every day | ORAL | 0 refills | Status: DC
  Filled 2021-08-19: qty 30, 30d supply, fill #0
  Filled ????-??-??: fill #0

## 2021-08-13 MED ORDER — CIPROFLOXACIN HCL 250 MG PO TABS
250.0000 mg | ORAL_TABLET | ORAL | 0 refills | Status: DC
  Filled 2021-08-19: qty 30, 30d supply, fill #0
  Filled ????-??-??: fill #0

## 2021-08-19 ENCOUNTER — Other Ambulatory Visit (HOSPITAL_COMMUNITY): Payer: Self-pay

## 2021-08-22 ENCOUNTER — Other Ambulatory Visit: Payer: Self-pay | Admitting: Nurse Practitioner

## 2021-08-22 ENCOUNTER — Telehealth: Payer: No Typology Code available for payment source | Admitting: Nurse Practitioner

## 2021-08-22 DIAGNOSIS — B9689 Other specified bacterial agents as the cause of diseases classified elsewhere: Secondary | ICD-10-CM

## 2021-08-22 DIAGNOSIS — N760 Acute vaginitis: Secondary | ICD-10-CM | POA: Diagnosis not present

## 2021-08-22 MED ORDER — METRONIDAZOLE 0.75 % VA GEL: 1.0000 | Freq: Two times a day (BID) | VAGINAL | 0 refills | Status: DC

## 2021-08-22 NOTE — Progress Notes (Signed)
I have spent 5 minutes in review of e-visit questionnaire, review and updating patient chart, medical decision making and response to patient.  ° °Karen Huhta W Annaliesa Blann, NP ° °  °

## 2021-08-22 NOTE — Progress Notes (Signed)
E-Visit for Vaginal Symptoms ? ?We are sorry that you are not feeling well. Here is how we plan to help! ?Based on what you shared with me it looks like you: May have a vaginosis due to bacteria ? ?Vaginosis is an inflammation of the vagina that can result in discharge, itching and pain. The cause is usually a change in the normal balance of vaginal bacteria or an infection. Vaginosis can also result from reduced estrogen levels after menopause. ? ?The most common causes of vaginosis are: ? ? Bacterial vaginosis which results from an overgrowth of one on several organisms that are normally present in your vagina. ? ? Yeast infections which are caused by a naturally occurring fungus called candida. ? ? Vaginal atrophy (atrophic vaginosis) which results from the thinning of the vagina from reduced estrogen levels after menopause. ? ? Trichomoniasis which is caused by a parasite and is commonly transmitted by sexual intercourse. ? ?Factors that increase your risk of developing vaginosis include: ?Medications, such as antibiotics and steroids ?Uncontrolled diabetes ?Use of hygiene products such as bubble bath, vaginal spray or vaginal deodorant ?Douching ?Wearing damp or tight-fitting clothing ?Using an intrauterine device (IUD) for birth control ?Hormonal changes, such as those associated with pregnancy, birth control pills or menopause ?Sexual activity ?Having a sexually transmitted infection ? ?Your treatment plan is metrogel applied vaginally for 7 days.  I have electronically sent this prescription into the pharmacy that you have chosen. ? ?Be sure to take all of the medication as directed. Stop taking any medication if you develop a rash, tongue swelling or shortness of breath. Mothers who are breast feeding should consider pumping and discarding their breast milk while on these antibiotics. However, there is no consensus that infant exposure at these doses would be harmful.  ?Remember that medication creams can  weaken latex condoms. ?. ? ? ?HOME CARE: ? ?Good hygiene may prevent some types of vaginosis from recurring and may relieve some symptoms: ? ?Avoid baths, hot tubs and whirlpool spas. Rinse soap from your outer genital area after a shower, and dry the area well to prevent irritation. Don't use scented or harsh soaps, such as those with deodorant or antibacterial action. ?Avoid irritants. These include scented tampons and pads. ?Wipe from front to back after using the toilet. Doing so avoids spreading fecal bacteria to your vagina. ? ?Other things that may help prevent vaginosis include: ? ?Don't douche. Your vagina doesn't require cleansing other than normal bathing. Repetitive douching disrupts the normal organisms that reside in the vagina and can actually increase your risk of vaginal infection. Douching won't clear up a vaginal infection. ?Use a latex condom. Both female and female latex condoms may help you avoid infections spread by sexual contact. ?Wear cotton underwear. Also wear pantyhose with a cotton crotch. If you feel comfortable without it, skip wearing underwear to bed. Yeast thrives in moist environments ?Your symptoms should improve in the next day or two. ? ?GET HELP RIGHT AWAY IF: ? ?You have pain in your lower abdomen ( pelvic area or over your ovaries) ?You develop nausea or vomiting ?You develop a fever ?Your discharge changes or worsens ?You have persistent pain with intercourse ?You develop shortness of breath, a rapid pulse, or you faint. ? ?These symptoms could be signs of problems or infections that need to be evaluated by a medical provider now. ? ?MAKE SURE YOU  ? ?Understand these instructions. ?Will watch your condition. ?Will get help right away if you are  not doing well or get worse. ? ?Thank you for choosing an e-visit. ? ?Your e-visit answers were reviewed by a board certified advanced clinical practitioner to complete your personal care plan. Depending upon the condition, your plan  could have included both over the counter or prescription medications. ? ?Please review your pharmacy choice. Make sure the pharmacy is open so you can pick up prescription now. If there is a problem, you may contact your provider through [employer] and have the prescription routed to another pharmacy.  Your safety is important to [place]. If you have drug allergies check your prescription carefully.  ? ?For the next 24 hours you can use MyChart to ask questions about today's visit, request a non-urgent call back, or ask for a work or school excuse. ?You will get an email in the next two days asking about your experience. I hope that your e-visit has been valuable and will speed your recovery.  ?

## 2021-08-23 NOTE — Telephone Encounter (Signed)
Requested medication (s) are due for refill today - no ? ?Requested medication (s) are on the active medication list -yes ? ?Future visit scheduled -no ? ?Last refill: 08/22/21 70g ? ?Notes to clinic: Pharmacy request dosing clarification ? ?Requested Prescriptions  ?Pending Prescriptions Disp Refills  ? metroNIDAZOLE (METROGEL) 0.75 % vaginal gel [Pharmacy Med Name: METRONIDAZOLE VAGINAL 0.75% GL] 70 g 0  ?  Sig: INSERT 1 APPLICATOR TWICE A DAY  ?  ? Off-Protocol Failed - 08/22/2021 12:58 PM  ?  ?  Failed - Medication not assigned to a protocol, review manually.  ?  ?  Passed - Valid encounter within last 12 months  ?  Recent Outpatient Visits   ? ?      ? 2 weeks ago Encounter for general adult medical examination with abnormal findings  ? [facility] [place], [place] W, NP  ? 5 months ago Burning with urination  ? [place], NP  ? 10 months ago Snoring  ? [facility] [place], Coralie Keens, NP  ? ?  ?  ? ? ?  ?  ?  ? ? ? ?Requested Prescriptions  ?Pending Prescriptions Disp Refills  ? metroNIDAZOLE (METROGEL) 0.75 % vaginal gel [Pharmacy Med Name: METRONIDAZOLE VAGINAL 0.75% GL] 70 g 0  ?  Sig: INSERT 1 APPLICATOR TWICE A DAY  ?  ? Off-Protocol Failed - 08/22/2021 12:58 PM  ?  ?  Failed - Medication not assigned to a protocol, review manually.  ?  ?  Passed - Valid encounter within last 12 months  ?  Recent Outpatient Visits   ? ?      ? 2 weeks ago Encounter for general adult medical examination with abnormal findings  ? [facility] [place], [place] W, NP  ? 5 months ago Burning with urination  ? [place], NP  ? 10 months ago Snoring  ? [facility] [place], Coralie Keens, NP  ? ?  ?  ? ? ?  ?  ?  ? ? ? ?

## 2021-10-06 ENCOUNTER — Other Ambulatory Visit: Payer: Self-pay | Admitting: Internal Medicine

## 2021-10-06 ENCOUNTER — Other Ambulatory Visit (HOSPITAL_COMMUNITY): Payer: Self-pay

## 2021-10-06 MED ORDER — HYDROXYCHLOROQUINE SULFATE 200 MG PO TABS
200.0000 mg | ORAL_TABLET | Freq: Every day | ORAL | 0 refills | Status: DC
  Filled 2021-10-06: qty 90, 90d supply, fill #0

## 2021-10-06 NOTE — Telephone Encounter (Signed)
Requested medication (s) are due for refill today:yes  Requested medication (s) are on the active medication list: yes  Last refill:  06/23/21  Future visit scheduled: no  Notes to clinic:  Unable to refill per protocol, no protocol attached. Routing for approval.     Requested Prescriptions  Pending Prescriptions Disp Refills   omeprazole (PRILOSEC) 20 MG capsule 30 capsule 3    Sig: TAKE 1 CAPSULE (20 MG TOTAL) BY MOUTH DAILY.     There is no refill protocol information for this order

## 2021-10-06 NOTE — Telephone Encounter (Signed)
Requested medication (s) are due for refill today: yes  Requested medication (s) are on the active medication list: yes  Last refill:  08/13/21  Future visit scheduled: no  Notes to clinic:  Unable to refill per protocol, routing for manuel review and approval.     Requested Prescriptions  Pending Prescriptions Disp Refills   Suvorexant (BELSOMRA) 10 MG TABS 30 tablet 0    Sig: Take 1 tablet by mouth at bedtime.     Off-Protocol Failed - 10/06/2021  9:59 AM      Failed - Medication not assigned to a protocol, review manually.      Passed - Valid encounter within last 12 months    Recent Outpatient Visits           2 months ago Encounter for general adult medical examination with abnormal findings   [facility] [place], Salvadore Oxford, NP   6 months ago Burning with urination   [facility] [place], Salvadore Oxford, NP   12 months ago Snoring   [facility] [place], Salvadore Oxford, [place]

## 2021-10-07 ENCOUNTER — Other Ambulatory Visit (HOSPITAL_COMMUNITY): Payer: Self-pay

## 2021-10-07 MED ORDER — BELSOMRA 10 MG PO TABS
1.0000 | ORAL_TABLET | Freq: Every day | ORAL | 0 refills | Status: DC
  Filled 2021-10-07: qty 30, 30d supply, fill #0

## 2021-10-07 MED ORDER — OMEPRAZOLE 20 MG PO CPDR
20.0000 mg | DELAYED_RELEASE_CAPSULE | Freq: Every day | ORAL | 1 refills | Status: DC
  Filled 2021-10-07: qty 90, 90d supply, fill #0
  Filled 2022-04-26: qty 90, 90d supply, fill #1

## 2021-10-14 ENCOUNTER — Other Ambulatory Visit (HOSPITAL_COMMUNITY): Payer: Self-pay

## 2021-12-01 ENCOUNTER — Encounter: Payer: Self-pay | Admitting: Internal Medicine

## 2021-12-02 ENCOUNTER — Other Ambulatory Visit (HOSPITAL_COMMUNITY): Payer: Self-pay

## 2021-12-02 MED ORDER — ESTRADIOL 0.025 MG/24HR TD PTTW
1.0000 | MEDICATED_PATCH | TRANSDERMAL | 2 refills | Status: DC
  Filled 2021-12-02: qty 8, 28d supply, fill #0
  Filled 2021-12-21 – 2022-01-21 (×2): qty 8, 28d supply, fill #1

## 2021-12-21 ENCOUNTER — Other Ambulatory Visit (HOSPITAL_COMMUNITY): Payer: Self-pay

## 2021-12-21 ENCOUNTER — Other Ambulatory Visit: Payer: Self-pay | Admitting: Internal Medicine

## 2021-12-22 NOTE — Telephone Encounter (Signed)
Requested medications are due for refill today.  yes  Requested medications are on the active medications list.  yes  Last refill. Cipro 08/13/2021 #30 0 refills, Belsomra 10/07/2021 #0 0 refills  Future visit scheduled.   no  Notes to clinic.  Medication refills are not delegated.    Requested Prescriptions  Pending Prescriptions Disp Refills   ciprofloxacin (CIPRO) 250 MG tablet 30 tablet 0    Sig: Take 1 tablet (250 mg total) by mouth once after intercourse     Off-Protocol Failed - 12/21/2021  2:12 PM      Failed - Medication not assigned to a protocol, review manually.      Passed - Valid encounter within last 12 months    Recent Outpatient Visits           4 months ago Encounter for general adult medical examination with abnormal findings   [facility] [place], Salvadore Oxford, NP   9 months ago Burning with urination   [facility], Salvadore Oxford, NP   1 year ago Snoring   [facility] [place], [place] W, NP               Suvorexant (BELSOMRA) 10 MG TABS 30 tablet 0    Sig: Take 1 tablet by mouth at bedtime.     Off-Protocol Failed - 12/21/2021  2:12 PM      Failed - Medication not assigned to a protocol, review manually.      Passed - Valid encounter within last 12 months    Recent Outpatient Visits           4 months ago Encounter for general adult medical examination with abnormal findings   [facility] [place], Salvadore Oxford, NP   9 months ago Burning with urination   [facility] [place], Salvadore Oxford, NP   1 year ago Snoring   [facility] [place], Salvadore Oxford, [place]

## 2021-12-23 ENCOUNTER — Other Ambulatory Visit (HOSPITAL_COMMUNITY): Payer: Self-pay

## 2021-12-23 MED ORDER — CIPROFLOXACIN HCL 250 MG PO TABS
250.0000 mg | ORAL_TABLET | ORAL | 0 refills | Status: DC
  Filled 2021-12-23 – 2022-04-26 (×3): qty 30, 30d supply, fill #0

## 2021-12-23 MED ORDER — BELSOMRA 10 MG PO TABS
1.0000 | ORAL_TABLET | Freq: Every day | ORAL | 0 refills | Status: DC
  Filled 2021-12-23 – 2022-02-09 (×3): qty 30, 30d supply, fill #0

## 2021-12-24 ENCOUNTER — Other Ambulatory Visit (HOSPITAL_COMMUNITY): Payer: Self-pay

## 2022-01-11 ENCOUNTER — Other Ambulatory Visit (HOSPITAL_COMMUNITY): Payer: Self-pay

## 2022-01-21 ENCOUNTER — Other Ambulatory Visit (HOSPITAL_COMMUNITY): Payer: Self-pay

## 2022-01-21 ENCOUNTER — Other Ambulatory Visit: Payer: Self-pay | Admitting: Internal Medicine

## 2022-01-21 ENCOUNTER — Other Ambulatory Visit: Payer: Self-pay | Admitting: Nurse Practitioner

## 2022-01-21 DIAGNOSIS — B9689 Other specified bacterial agents as the cause of diseases classified elsewhere: Secondary | ICD-10-CM

## 2022-01-21 DIAGNOSIS — A609 Anogenital herpesviral infection, unspecified: Secondary | ICD-10-CM

## 2022-01-21 MED ORDER — METRONIDAZOLE 0.75 % VA GEL
1.0000 | Freq: Two times a day (BID) | VAGINAL | 0 refills | Status: DC
  Filled 2022-01-21: qty 70, 4d supply, fill #0

## 2022-01-21 MED ORDER — VALACYCLOVIR HCL 1 G PO TABS
500.0000 mg | ORAL_TABLET | ORAL | 0 refills | Status: DC
  Filled 2022-01-21 – 2022-04-26 (×2): qty 90, 90d supply, fill #0

## 2022-01-21 NOTE — Telephone Encounter (Signed)
Requested Prescriptions  Pending Prescriptions Disp Refills  . valACYclovir (VALTREX) 1000 MG tablet 90 tablet 0    Sig: Take 0.5 tablets (500 mg total) by mouth twice daily for 3 days for recurrent outbreaks and 1 tablet daily for suppressive therapy     Antimicrobials:  Antiviral Agents - Anti-Herpetic Passed - 01/21/2022 11:52 AM      Passed - Valid encounter within last 12 months    Recent Outpatient Visits          5 months ago Encounter for general adult medical examination with abnormal findings   [facility] [place], Coralie Keens, NP   10 months ago Burning with urination   [facility] [place], Coralie Keens, NP   1 year ago [facility] [place], Coralie Keens, NP      Future Appointments            In 2 weeks Garnette Gunner, Coralie Keens, NP [facility]

## 2022-01-24 ENCOUNTER — Other Ambulatory Visit (HOSPITAL_COMMUNITY): Payer: Self-pay

## 2022-01-24 MED ORDER — UREA 40 % EX CREA
TOPICAL_CREAM | CUTANEOUS | 11 refills | Status: DC
  Filled 2022-01-24 – 2022-02-09 (×2): qty 28.35, 30d supply, fill #0
  Filled 2022-04-26: qty 28.35, 30d supply, fill #1

## 2022-01-24 MED ORDER — HYDROXYCHLOROQUINE SULFATE 200 MG PO TABS
200.0000 mg | ORAL_TABLET | Freq: Every day | ORAL | 0 refills | Status: DC
  Filled 2022-01-24 – 2022-02-09 (×2): qty 90, 90d supply, fill #0

## 2022-01-25 ENCOUNTER — Other Ambulatory Visit (HOSPITAL_COMMUNITY): Payer: Self-pay

## 2022-02-03 ENCOUNTER — Other Ambulatory Visit (HOSPITAL_COMMUNITY): Payer: Self-pay

## 2022-02-08 ENCOUNTER — Ambulatory Visit (INDEPENDENT_AMBULATORY_CARE_PROVIDER_SITE_OTHER): Payer: No Typology Code available for payment source | Admitting: Internal Medicine

## 2022-02-08 ENCOUNTER — Encounter: Payer: Self-pay | Admitting: Internal Medicine

## 2022-02-08 VITALS — BP 104/70 | HR 72 | Temp 98.0°F | Wt 149.0 lb

## 2022-02-08 DIAGNOSIS — B009 Herpesviral infection, unspecified: Secondary | ICD-10-CM

## 2022-02-08 DIAGNOSIS — D702 Other drug-induced agranulocytosis: Secondary | ICD-10-CM | POA: Diagnosis not present

## 2022-02-08 DIAGNOSIS — F5104 Psychophysiologic insomnia: Secondary | ICD-10-CM

## 2022-02-08 DIAGNOSIS — I7300 Raynaud's syndrome without gangrene: Secondary | ICD-10-CM

## 2022-02-08 DIAGNOSIS — R7303 Prediabetes: Secondary | ICD-10-CM | POA: Diagnosis not present

## 2022-02-08 DIAGNOSIS — D508 Other iron deficiency anemias: Secondary | ICD-10-CM | POA: Diagnosis not present

## 2022-02-08 DIAGNOSIS — M329 Systemic lupus erythematosus, unspecified: Secondary | ICD-10-CM

## 2022-02-08 DIAGNOSIS — E7800 Pure hypercholesterolemia, unspecified: Secondary | ICD-10-CM

## 2022-02-08 DIAGNOSIS — M3500 Sicca syndrome, unspecified: Secondary | ICD-10-CM

## 2022-02-08 DIAGNOSIS — N390 Urinary tract infection, site not specified: Secondary | ICD-10-CM

## 2022-02-08 DIAGNOSIS — K219 Gastro-esophageal reflux disease without esophagitis: Secondary | ICD-10-CM

## 2022-02-08 DIAGNOSIS — F411 Generalized anxiety disorder: Secondary | ICD-10-CM

## 2022-02-08 NOTE — Progress Notes (Signed)
Subjective:    Patient ID: Cheryl Williams, female    DOB: 11-28-1965, 56 y.o.   MRN: [id number]  HPI  Patient presents to clinic today for 2-month follow-up of chronic conditions.  Lupus/Sjogren's: Managed on Plaquenil.  She follows with rheumatology.  Anxiety: Triggered by flying and public speaking.  She takes Propanolol as needed.  She is not currently seeing a therapist.  She denies depression, SI/HI.  GERD: Intermittent.  She takes Omeprazole as needed.  There is no upper GI on file.  Insomnia: She has difficulty falling asleep.  She is taking Belsomra as prescribed with good relief of symptoms.  Study from 02/2021 reviewed.  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  She takes Valacyclovir only as needed.  Raynaud's: She denies any discoloration or pain of her fingers/toes.  She is not currently taking any medications for this.  Anemia: Her last H/H was 10.3/34.7, 07/2021.  She is not taking any oral iron at this time.  She does not follow with hematology.  Neutropenia: Her last WBC was 3.2, 07/2021.  She does not follow with hematology.  HLD: Her last LDL was 114, triglycerides 111, 07/2021.  She is not taking any cholesterol-lowering medication at this time.  She tries to consume a low-fat diet.  Prediabetes: Her last A1c was 6%, 07/2021.  She is not taking any oral diabetic medication at this time.  She does not check her sugars.  Postcoital UTI: Managed with Cipro as needed.  She does not follow with urogynecology.  Review of Systems     Past Medical History:  Diagnosis Date   Anemia    Anxiety    claustrphobia   Chicken pox    GERD (gastroesophageal reflux disease)    IBS (irritable bowel syndrome)    Lupus (HCC)    Raynaud disease    Sjogrens syndrome (HCC)     Current Outpatient Medications  Medication Sig Dispense Refill   ciprofloxacin (CIPRO) 250 MG tablet Take 1 tablet (250 mg total) by mouth once after intercourse 30 tablet 0   estradiol  (VIVELLE-DOT) 0.025 MG/24HR Place 1 patch onto the skin 2 (two) times a week. 8 patch 2   hydroxychloroquine (PLAQUENIL) 200 MG tablet Take 1 tablet (200 mg total) by mouth daily with food or milk 90 tablet 0   ibuprofen (ADVIL,MOTRIN) 600 MG tablet TAKE 1 TABLET BY MOUTH 3 TIMES DAILY. 60 tablet 5   metroNIDAZOLE (METROGEL VAGINAL) 0.75 % vaginal gel Place 1 Applicatorful vaginally 2 (two) times daily. 70 g 0   omeprazole (PRILOSEC) 20 MG capsule Take 1 capsule (20 mg total) by mouth daily. 90 capsule 1   propranolol (INDERAL) 10 MG tablet Take 1 tablet (10 mg total) by mouth daily as needed. 30 tablet 1   Suvorexant (BELSOMRA) 10 MG TABS Take 1 tablet by mouth at bedtime. 30 tablet 0   urea (CARMOL) 40 % CREA Apply to the affected toe daily to twice daily as needed. 28.35 g 11   valACYclovir (VALTREX) 1000 MG tablet Take 0.5 tablets (500 mg total) by mouth twice daily for 3 days for recurrent outbreaks and 1 tablet daily for suppressive therapy 90 tablet 0   No current facility-administered medications for this visit.    Allergies  Allergen Reactions   Bactrim [Sulfamethoxazole-Trimethoprim] Hives   Trimethoprim Hives   Imuran [Azathioprine] Other (See Comments)    Mental hallucinations   Penicillins      Rash and hives at age 58  Family History  Problem Relation Age of Onset   Hyperlipidemia Mother    Hypertension Mother    Deafness Mother    Colon polyps Mother    Alcohol abuse Father    Deafness Father    Drug abuse Sister    Hypertension Sister    Drug abuse Brother    Rheum arthritis Maternal Aunt    Lupus Maternal Aunt    Rheum arthritis Maternal Grandmother    Hypertension Sister    Colon cancer Neg Hx    Esophageal cancer Neg Hx    Rectal cancer Neg Hx    Stomach cancer Neg Hx     Social History   Socioeconomic History   Marital status: Divorced    Spouse name: Not on file   Number of children: Not on file   Years of education: Not on file   Highest  education level: Not on file  Occupational History   Not on file  Tobacco Use   Smoking status: Never   Smokeless tobacco: Never  Substance and Sexual Activity   Alcohol use: Yes    Alcohol/week: 0.0 standard drinks of alcohol    Comment: occasional--moderate   Drug use: No   Sexual activity: Yes  Other Topics Concern   Not on file  Social History Narrative   Not on file   Social Determinants of Health   Financial Resource Strain: Not on file  Food Insecurity: Not on file  Transportation Needs: Not on file  Physical Activity: Not on file  Stress: Not on file  Social Connections: Not on file  Intimate Partner Violence: Not on file     Constitutional: Denies fever, malaise, fatigue, headache or abrupt weight changes.  HEENT: Denies eye pain, eye redness, ear pain, ringing in the ears, wax buildup, runny nose, nasal congestion, bloody nose, or sore throat. Respiratory: Denies difficulty breathing, shortness of breath, cough or sputum production.   Cardiovascular: Denies chest pain, chest tightness, palpitations or swelling in the hands or feet.  Gastrointestinal: Denies abdominal pain, bloating, constipation, diarrhea or blood in the stool.  GU: Denies urgency, frequency, pain with urination, burning sensation, blood in urine, odor or discharge. Musculoskeletal: Denies decrease in range of motion, difficulty with gait, muscle pain or joint pain and swelling.  Skin: Denies redness, rashes, lesions or ulcercations.  Neurological: Patient reports insomnia, hot flashes.  Denies dizziness, difficulty with memory, difficulty with speech or problems with balance and coordination.  Psych: Patient has a history of anxiety.  Denies depression, SI/HI.  No other specific complaints in a complete review of systems (except as listed in HPI above).  Objective:   Physical Exam BP 104/70 (BP Location: Left Arm, Patient Position: Sitting, Cuff Size: Normal)   Pulse 72   Temp 98 F (36.7 C)  (Oral)   Wt 149 lb (67.6 kg)   LMP 06/17/2019   SpO2 99%   BMI 23.34 kg/m   Wt Readings from Last 3 Encounters:  08/03/21 152 lb (68.9 kg)  07/13/21 154 lb (69.9 kg)  03/19/21 157 lb (71.2 kg)    General: Appears her stated age, well developed, well nourished in NAD. Skin: Warm, dry and intact.  HEENT: Head: normal shape and size; Eyes: sclera white, no icterus, conjunctiva pink, PERRLA and EOMs intact;  Cardiovascular: Normal rate and rhythm. S1,S2 noted.  No murmur, rubs or gallops noted. No JVD or BLE edema. No carotid bruits noted. Pulmonary/Chest: Normal effort and positive vesicular breath sounds. No respiratory distress. No  wheezes, rales or ronchi noted.  Musculoskeletal: Mild pain with palpation left subscapular region.  No bony tenderness noted over the lumbar spine.  No difficulty with gait.  Neurological: Alert and oriented. Cranial nerves II-XII grossly intact. Coordination normal.     BMET    Component Value Date/Time   NA 142 08/03/2021 1551   K 3.6 08/03/2021 1551   CL 106 08/03/2021 1551   CO2 29 08/03/2021 1551   GLUCOSE 87 08/03/2021 1551   BUN 18 08/03/2021 1551   CREATININE 0.87 08/03/2021 1551   CALCIUM 9.3 08/03/2021 1551    Lipid Panel     Component Value Date/Time   CHOL 193 08/03/2021 1551   TRIG 111 08/03/2021 1551   HDL 57 08/03/2021 1551   CHOLHDL 3.4 08/03/2021 1551   VLDL 14.4 05/26/2020 0955   LDLCALC 114 (H) 08/03/2021 1551    CBC    Component Value Date/Time   WBC 3.2 (L) 08/03/2021 1551   RBC 3.58 (L) 08/03/2021 1551   HGB 10.3 (L) 08/03/2021 1551   HCT 31.7 (L) 08/03/2021 1551   PLT 213 08/03/2021 1551   MCV 88.5 08/03/2021 1551   MCH 28.8 08/03/2021 1551   MCHC 32.5 08/03/2021 1551   RDW 13.6 08/03/2021 1551    Hgb A1C Lab Results  Component Value Date   HGBA1C 6.0 (H) 08/03/2021            Assessment & Plan:     RTC in 6 months for your annual exam Webb Silversmith, NP

## 2022-02-08 NOTE — Assessment & Plan Note (Signed)
Continue valacyclovir as needed 

## 2022-02-08 NOTE — Assessment & Plan Note (Signed)
CBC and iron panel today 

## 2022-02-08 NOTE — Assessment & Plan Note (Signed)
Continue Plaquenil per rheumatology 

## 2022-02-08 NOTE — Patient Instructions (Signed)

## 2022-02-08 NOTE — Assessment & Plan Note (Signed)
Continue propanolol as needed

## 2022-02-08 NOTE — Assessment & Plan Note (Signed)
Encourage low-carb diet and exercise for weight loss CBC today

## 2022-02-08 NOTE — Assessment & Plan Note (Signed)
Continue Belsomra.  

## 2022-02-08 NOTE — Assessment & Plan Note (Signed)
Continue omeprazole as needed. 

## 2022-02-08 NOTE — Assessment & Plan Note (Signed)
Not treated We will monitor

## 2022-02-08 NOTE — Assessment & Plan Note (Signed)
C-Met and lipid profile today Encourage her to consume a low-fat diet

## 2022-02-08 NOTE — Assessment & Plan Note (Signed)
Continue Cipro as needed

## 2022-02-08 NOTE — Assessment & Plan Note (Signed)
CBC today.  

## 2022-02-09 ENCOUNTER — Other Ambulatory Visit: Payer: Self-pay | Admitting: Internal Medicine

## 2022-02-09 ENCOUNTER — Other Ambulatory Visit (HOSPITAL_COMMUNITY): Payer: Self-pay

## 2022-02-09 ENCOUNTER — Encounter: Payer: Self-pay | Admitting: Internal Medicine

## 2022-02-09 DIAGNOSIS — B9689 Other specified bacterial agents as the cause of diseases classified elsewhere: Secondary | ICD-10-CM

## 2022-02-09 LAB — COMPLETE METABOLIC PANEL WITH GFR
AG Ratio: 1.3 (calc) (ref 1.0–2.5)
ALT: 17 U/L (ref 6–29)
AST: 24 U/L (ref 10–35)
Albumin: 4.5 g/dL (ref 3.6–5.1)
Alkaline phosphatase (APISO): 63 U/L (ref 37–153)
BUN: 21 mg/dL (ref 7–25)
CO2: 28 mmol/L (ref 20–32)
Calcium: 9.4 mg/dL (ref 8.6–10.4)
Chloride: 103 mmol/L (ref 98–110)
Creat: 0.79 mg/dL (ref 0.50–1.03)
Globulin: 3.6 g/dL (calc) (ref 1.9–3.7)
Glucose, Bld: 85 mg/dL (ref 65–99)
Potassium: 3.9 mmol/L (ref 3.5–5.3)
Sodium: 139 mmol/L (ref 135–146)
Total Bilirubin: 0.5 mg/dL (ref 0.2–1.2)
Total Protein: 8.1 g/dL (ref 6.1–8.1)
eGFR: 89 mL/min/{1.73_m2} (ref >=60)

## 2022-02-09 LAB — HEMOGLOBIN A1C
Hgb A1c MFr Bld: 5.9 % of total Hgb — ABNORMAL HIGH (ref <5.7)
Mean Plasma Glucose: 123 mg/dL
eAG (mmol/L): 6.8 mmol/L

## 2022-02-09 LAB — IRON,TIBC AND FERRITIN PANEL
%SAT: 25 % (calc) (ref 16–45)
Ferritin: 75 ng/mL (ref 16–232)
Iron: 79 ug/dL (ref 45–160)
TIBC: 314 mcg/dL (calc) (ref 250–450)

## 2022-02-09 LAB — CBC
HCT: 33.8 % — ABNORMAL LOW (ref 35.0–45.0)
Hemoglobin: 10.9 g/dL — ABNORMAL LOW (ref 11.7–15.5)
MCH: 28.9 pg (ref 27.0–33.0)
MCHC: 32.2 g/dL (ref 32.0–36.0)
MCV: 89.7 fL (ref 80.0–100.0)
MPV: 11 fL (ref 7.5–12.5)
Platelets: 227 10*3/uL (ref 140–400)
RBC: 3.77 10*6/uL — ABNORMAL LOW (ref 3.80–5.10)
RDW: 13.5 % (ref 11.0–15.0)
WBC: 4.6 10*3/uL (ref 3.8–10.8)

## 2022-02-09 LAB — LIPID PANEL
Cholesterol: 200 mg/dL — ABNORMAL HIGH (ref <200)
HDL: 69 mg/dL (ref >=50)
LDL Cholesterol (Calc): 113 mg/dL (calc) — ABNORMAL HIGH
Non-HDL Cholesterol (Calc): 131 mg/dL (calc) — ABNORMAL HIGH (ref <130)
Total CHOL/HDL Ratio: 2.9 (calc) (ref <5.0)
Triglycerides: 81 mg/dL (ref <150)

## 2022-02-09 MED ORDER — CYCLOBENZAPRINE HCL 10 MG PO TABS
10.0000 mg | ORAL_TABLET | Freq: Two times a day (BID) | ORAL | 0 refills | Status: DC | PRN
  Filled 2022-02-09: qty 20, 10d supply, fill #0

## 2022-02-10 ENCOUNTER — Other Ambulatory Visit (HOSPITAL_COMMUNITY): Payer: Self-pay

## 2022-02-10 NOTE — Telephone Encounter (Signed)
Unable to refill per protocol, Rx expired. Medication was discontinued 02/08/22 by PCP, will refuse request.  Requested Prescriptions  Pending Prescriptions Disp Refills  . metroNIDAZOLE (METROGEL) 0.75 % vaginal gel 70 g 0    Sig: Place 1 Applicatorful vaginally 2 (two) times daily.     Off-Protocol Failed - 02/09/2022  8:07 AM      Failed - Medication not assigned to a protocol, review manually.      Passed - Valid encounter within last 12 months    Recent Outpatient Visits          2 days ago Pure hypercholesterolemia   [place], Coralie Keens, NP   6 months ago Encounter for general adult medical examination with abnormal findings   [facility] [place], Coralie Keens, NP   10 months ago Burning with urination   [facility] [place], Coralie Keens, NP   1 year ago [facility], Coralie Keens, NP      Future Appointments            In 5 days [place], Coralie Keens, NP [facility], [facility]

## 2022-02-11 ENCOUNTER — Other Ambulatory Visit (HOSPITAL_COMMUNITY): Payer: Self-pay

## 2022-02-15 ENCOUNTER — Ambulatory Visit (INDEPENDENT_AMBULATORY_CARE_PROVIDER_SITE_OTHER): Payer: No Typology Code available for payment source | Admitting: Internal Medicine

## 2022-02-15 ENCOUNTER — Other Ambulatory Visit (HOSPITAL_COMMUNITY): Payer: Self-pay

## 2022-02-15 ENCOUNTER — Encounter: Payer: Self-pay | Admitting: Internal Medicine

## 2022-02-15 ENCOUNTER — Other Ambulatory Visit (HOSPITAL_COMMUNITY)
Admission: RE | Admit: 2022-02-15 | Discharge: 2022-02-15 | Disposition: A | Discharge status: Home / Self Care | Payer: No Typology Code available for payment source | Source: Ambulatory Visit | Attending: Internal Medicine | Admitting: Internal Medicine

## 2022-02-15 ENCOUNTER — Other Ambulatory Visit: Payer: Self-pay

## 2022-02-15 VITALS — BP 112/70 | HR 80 | Temp 96.9°F | Wt 152.0 lb

## 2022-02-15 DIAGNOSIS — N951 Menopausal and female climacteric states: Secondary | ICD-10-CM

## 2022-02-15 DIAGNOSIS — Z01419 Encounter for gynecological examination (general) (routine) without abnormal findings: Secondary | ICD-10-CM

## 2022-02-15 DIAGNOSIS — Z124 Encounter for screening for malignant neoplasm of cervix: Secondary | ICD-10-CM | POA: Insufficient documentation

## 2022-02-15 MED ORDER — INFLUENZA VAC SPLIT QUAD 0.5 ML IM SUSY
0.5000 mL | PREFILLED_SYRINGE | Freq: Once | INTRAMUSCULAR | 0 refills | Status: DC
  Filled 2022-02-15: qty 0.5, 1d supply, fill #0

## 2022-02-15 MED ORDER — VEOZAH 45 MG PO TABS
45.0000 mg | ORAL_TABLET | Freq: Every day | ORAL | 1 refills | Status: DC
  Filled 2022-02-15 – 2022-04-26 (×4): qty 90, 90d supply, fill #0

## 2022-02-15 MED ORDER — COVID-19 MRNA 2023-2024 VACCINE (COMIRNATY) 0.3 ML INJECTION
0.3000 mL | Freq: Once | INTRAMUSCULAR | 0 refills | Status: DC
  Filled 2022-02-15: qty 0.3, 1d supply, fill #0

## 2022-02-15 NOTE — Patient Instructions (Signed)
Managing Hot Flashes During Menopause You will learn what hot flashes/night sweats are, what causes them and what the treatment methods are. To view the content, go to this web address: [email or web address]  This video will expire on: 12/22/2023. If you need access to this video following this date, please reach out to the healthcare provider who assigned it to you. This information is not intended to replace advice given to you by your health care provider. Make sure you discuss any questions you have with your health care provider. Elsevier Patient Education  2023 Elsevier Inc.  

## 2022-02-15 NOTE — Assessment & Plan Note (Signed)
Will trial Veozah 45 mg daily

## 2022-02-15 NOTE — Progress Notes (Signed)
Subjective:    Patient ID: Cheryl Williams, female    DOB: 11/13/1965, 56 y.o.   MRN: [id number]  HPI  Patient presents to clinic today for her Pap smear.  Her last Pap smear was 01/2017.  She continues to have regular menstrual cycles.  She is having hot flashes.  She is interested in trying [employer].  Review of Systems     Past Medical History:  Diagnosis Date   Anemia    Anxiety    claustrphobia   Chicken pox    GERD (gastroesophageal reflux disease)    IBS (irritable bowel syndrome)    Lupus (HCC)    Raynaud disease    Sjogrens syndrome (HCC)     Current Outpatient Medications  Medication Sig Dispense Refill   ciprofloxacin (CIPRO) 250 MG tablet Take 1 tablet (250 mg total) by mouth once after intercourse 30 tablet 0   cyclobenzaprine (FLEXERIL) 10 MG tablet Take 1 tablet (10 mg total) by mouth 2 (two) times daily as needed for muscle spasms for up to 10 days 20 tablet 0   hydroxychloroquine (PLAQUENIL) 200 MG tablet Take 1 tablet (200 mg total) by mouth daily with food or milk 90 tablet 0   omeprazole (PRILOSEC) 20 MG capsule Take 1 capsule (20 mg total) by mouth daily. 90 capsule 1   propranolol (INDERAL) 10 MG tablet Take 1 tablet (10 mg total) by mouth daily as needed. 30 tablet 1   Suvorexant (BELSOMRA) 10 MG TABS Take 1 tablet by mouth at bedtime. 30 tablet 0   urea (CARMOL) 40 % CREA Apply to the affected toe daily to twice daily as needed. 28.35 g 11   valACYclovir (VALTREX) 1000 MG tablet Take 0.5 tablets (500 mg total) by mouth twice daily for 3 days for recurrent outbreaks and 1 tablet daily for suppressive therapy 90 tablet 0   No current facility-administered medications for this visit.    Allergies  Allergen Reactions   Bactrim [Sulfamethoxazole-Trimethoprim] Hives   Trimethoprim Hives   Imuran [Azathioprine] Other (See Comments)    Mental hallucinations   Penicillins      Rash and hives at age 62    Family History  Problem Relation Age of  Onset   Hyperlipidemia Mother    Hypertension Mother    Deafness Mother    Colon polyps Mother    Alcohol abuse Father    Deafness Father    Drug abuse Sister    Hypertension Sister    Drug abuse Brother    Rheum arthritis Maternal Aunt    Lupus Maternal Aunt    Rheum arthritis Maternal Grandmother    Hypertension Sister    Colon cancer Neg Hx    Esophageal cancer Neg Hx    Rectal cancer Neg Hx    Stomach cancer Neg Hx     Social History   Socioeconomic History   Marital status: Divorced    Spouse name: Not on file   Number of children: Not on file   Years of education: Not on file   Highest education level: Not on file  Occupational History   Not on file  Tobacco Use   Smoking status: Never   Smokeless tobacco: Never  Substance and Sexual Activity   Alcohol use: Yes    Alcohol/week: 0.0 standard drinks of alcohol    Comment: occasional--moderate   Drug use: No   Sexual activity: Yes  Other Topics Concern   Not on file  Social History Narrative  Not on file   Social Determinants of Health   Financial Resource Strain: Not on file  Food Insecurity: Not on file  Transportation Needs: Not on file  Physical Activity: Not on file  Stress: Not on file  Social Connections: Not on file  Intimate Partner Violence: Not on file     Constitutional: Denies fever, malaise, fatigue, headache or abrupt weight changes.  Respiratory: Denies difficulty breathing, shortness of breath, cough or sputum production.   Cardiovascular: Denies chest pain, chest tightness, palpitations or swelling in the hands or feet.  Gastrointestinal: Denies abdominal pain, bloating, constipation, diarrhea or blood in the stool.  GU: Denies urgency, frequency, pain with urination, burning sensation, blood in urine, odor or discharge.   No other specific complaints in a complete review of systems (except as listed in HPI above).  Objective:   Physical Exam   BP 112/70 (BP Location: Left  Arm, Patient Position: Sitting, Cuff Size: Normal)   Pulse 80   Temp (!) 96.9 F (36.1 C) (Temporal)   Wt 152 lb (68.9 kg)   LMP 06/17/2019   SpO2 99%   BMI 23.81 kg/m   Wt Readings from Last 3 Encounters:  02/08/22 149 lb (67.6 kg)  08/03/21 152 lb (68.9 kg)  07/13/21 154 lb (69.9 kg)    General: Appears her stated age, well developed, well nourished in NAD. Cardiovascular: Normal rate and rhythm.  Pulmonary/Chest: Normal effort. Abdomen: Soft and nontender.  Pelvic: Normal female anatomy.  Cervix without mass or lesion.  No CMT.  Adnexa nonpalpable. Neurological: Alert and oriented.   BMET    Component Value Date/Time   NA 139 02/08/2022 1129   K 3.9 02/08/2022 1129   CL 103 02/08/2022 1129   CO2 28 02/08/2022 1129   GLUCOSE 85 02/08/2022 1129   BUN 21 02/08/2022 1129   CREATININE 0.79 02/08/2022 1129   CALCIUM 9.4 02/08/2022 1129    Lipid Panel     Component Value Date/Time   CHOL 200 (H) 02/08/2022 1129   TRIG 81 02/08/2022 1129   HDL 69 02/08/2022 1129   CHOLHDL 2.9 02/08/2022 1129   VLDL 14.4 05/26/2020 0955   LDLCALC 113 (H) 02/08/2022 1129    CBC    Component Value Date/Time   WBC 4.6 02/08/2022 1129   RBC 3.77 (L) 02/08/2022 1129   HGB 10.9 (L) 02/08/2022 1129   HCT 33.8 (L) 02/08/2022 1129   PLT 227 02/08/2022 1129   MCV 89.7 02/08/2022 1129   MCH 28.9 02/08/2022 1129   MCHC 32.2 02/08/2022 1129   RDW 13.5 02/08/2022 1129    Hgb A1C Lab Results  Component Value Date   HGBA1C 5.9 (H) 02/08/2022           Assessment & Plan:   Screening for Cervical Cancer with Routine GYN exam:  Pap smear today, with STD screening  RTC in 6 months for your annual exam Webb Silversmith, NP

## 2022-02-18 LAB — CYTOLOGY - PAP
Adequacy: ABSENT
Chlamydia: NEGATIVE
Comment: NEGATIVE
Comment: NEGATIVE
Comment: NEGATIVE
Comment: NORMAL
Diagnosis: NEGATIVE
High risk HPV: NEGATIVE
Neisseria Gonorrhea: NEGATIVE
Trichomonas: NEGATIVE

## 2022-03-07 ENCOUNTER — Other Ambulatory Visit (HOSPITAL_COMMUNITY): Payer: Self-pay

## 2022-03-28 ENCOUNTER — Telehealth: Payer: Self-pay | Admitting: Pulmonary Disease

## 2022-03-29 NOTE — Telephone Encounter (Signed)
Hey ladies,  Is this patient on your upcoming list to have home sleep study done?  Please advise

## 2022-04-06 NOTE — Telephone Encounter (Signed)
In lab study was ordered, not HST. Will close this encounter.

## 2022-04-24 ENCOUNTER — Other Ambulatory Visit: Payer: Self-pay | Admitting: Internal Medicine

## 2022-04-25 NOTE — Telephone Encounter (Signed)
Requested medication (s) are due for refill today: routing for approval  Requested medication (s) are on the active medication list: yes  Last refill:  12/23/21  Future visit scheduled: yes  Notes to clinic:  Medication not assigned to a protocol, review manually      Requested Prescriptions  Pending Prescriptions Disp Refills   Suvorexant (BELSOMRA) 10 MG TABS 30 tablet 0    Sig: Take 1 tablet by mouth at bedtime.     Off-Protocol Failed - 04/24/2022  3:40 PM      Failed - Medication not assigned to a protocol, review manually.      Passed - Valid encounter within last 12 months    Recent Outpatient Visits           2 months ago Screening for cervical cancer   [place], NP   2 months ago Pure hypercholesterolemia   [place], Coralie Keens, NP   8 months ago Encounter for general adult medical examination with abnormal findings   [facility] [place], Coralie Keens, NP   1 year ago Burning with urination   [facility] [place], Coralie Keens, NP   1 year ago [facility] [place], Coralie Keens, [place]

## 2022-04-26 ENCOUNTER — Other Ambulatory Visit (HOSPITAL_COMMUNITY): Payer: Self-pay

## 2022-04-26 MED ORDER — BELSOMRA 10 MG PO TABS
1.0000 | ORAL_TABLET | Freq: Every day | ORAL | 0 refills | Status: DC
  Filled 2022-04-26: qty 30, 30d supply, fill #0

## 2022-05-05 ENCOUNTER — Other Ambulatory Visit (HOSPITAL_COMMUNITY): Payer: Self-pay

## 2022-05-05 DIAGNOSIS — L299 Pruritus, unspecified: Secondary | ICD-10-CM | POA: Diagnosis not present

## 2022-05-05 DIAGNOSIS — B351 Tinea unguium: Secondary | ICD-10-CM | POA: Diagnosis not present

## 2022-05-05 DIAGNOSIS — L249 Irritant contact dermatitis, unspecified cause: Secondary | ICD-10-CM | POA: Diagnosis not present

## 2022-05-05 DIAGNOSIS — L905 Scar conditions and fibrosis of skin: Secondary | ICD-10-CM | POA: Diagnosis not present

## 2022-05-05 MED ORDER — FLUOCINONIDE 0.05 % EX OINT
TOPICAL_OINTMENT | CUTANEOUS | 0 refills | Status: DC
  Filled 2022-05-05: qty 30, 30d supply, fill #0

## 2022-05-05 MED ORDER — TERBINAFINE HCL 250 MG PO TABS
250.0000 mg | ORAL_TABLET | Freq: Every day | ORAL | 0 refills | Status: DC
  Filled 2022-05-05: qty 90, 90d supply, fill #0

## 2022-05-11 ENCOUNTER — Ambulatory Visit (HOSPITAL_BASED_OUTPATIENT_CLINIC_OR_DEPARTMENT_OTHER): Payer: Commercial Managed Care - PPO | Attending: Pulmonary Disease | Admitting: Pulmonary Disease

## 2022-05-11 VITALS — Ht 67.0 in | Wt 150.0 lb

## 2022-05-11 DIAGNOSIS — G4733 Obstructive sleep apnea (adult) (pediatric): Secondary | ICD-10-CM | POA: Diagnosis not present

## 2022-05-11 DIAGNOSIS — R0683 Snoring: Secondary | ICD-10-CM | POA: Diagnosis not present

## 2022-05-11 DIAGNOSIS — G4700 Insomnia, unspecified: Secondary | ICD-10-CM | POA: Diagnosis not present

## 2022-05-11 DIAGNOSIS — R5383 Other fatigue: Secondary | ICD-10-CM | POA: Insufficient documentation

## 2022-05-16 ENCOUNTER — Other Ambulatory Visit: Payer: Self-pay

## 2022-05-24 ENCOUNTER — Telehealth: Payer: Self-pay | Admitting: Pulmonary Disease

## 2022-05-24 DIAGNOSIS — G4733 Obstructive sleep apnea (adult) (pediatric): Secondary | ICD-10-CM | POA: Diagnosis not present

## 2022-05-24 NOTE — Telephone Encounter (Signed)
Left detailed message about sleep study. Informed pt on vm to call us if she has questions. Otherwise nothing further needed.

## 2022-05-24 NOTE — Telephone Encounter (Signed)
Called the pt and there was no answer- LMTCB.  

## 2022-05-24 NOTE — Telephone Encounter (Signed)
Call patient  Sleep study result  Date of study: 05/11/2022  Impression: Negative study for significant sleep disordered breathing, good sleep efficiency during the study  Current study comparable with last study 03/07/2021  Recommendation:  Optimize sleep hygiene as able  Follow-up as previously scheduled

## 2022-05-24 NOTE — Procedures (Signed)
POLYSOMNOGRAPHY  Last, First: Cheryl, Williams MRN: [id number] Gender: Female Age (years): 32 Weight (lbs): 150 DOB: Jul 18, 1965 BMI: 23 Primary Care: No PCP Epworth Score: 0 Referring: Laurin Coder MD Technician: Carolin Coy Interpreting: Laurin Coder MD Study Type: NPSG Ordered Study Type: NPSG Study date: 05/11/2022 Location: [place] CLINICAL INFORMATION Cheryl Williams is a 57 year old Female and was referred to the sleep center for evaluation of G47.33 OSA: Adult and Pediatric (327.23). Indications include Daytime Fatigue, Fatigue, Insomnia, Snoring.   Most recent polysomnogram dated 03/07/2021 revealed an AHI of 0.5/h and RDI of 1.7/h. MEDICATIONS Patient self administered medications include: belsomra. Medications administered during study include Sleep medicine administered - Belsomra .5 mg at 08:45:00 PM  SLEEP STUDY TECHNIQUE A multi-channel overnight Polysomnography study was performed. The channels recorded and monitored were central and occipital EEG, electrooculogram (EOG), submentalis EMG (chin), nasal and oral airflow, thoracic and abdominal wall motion, anterior tibialis EMG, snore microphone, electrocardiogram, and a pulse oximetry. TECHNICIAN COMMENTS Comments added by Technician: PATIENT WAS ORDERED AS A NPSG ONLY. Comments added by Scorer: N/A SLEEP ARCHITECTURE The study was initiated at 10:39:05 PM and terminated at 4:50:05 AM. The total recorded time was 371 minutes. EEG confirmed total sleep time was 310 minutes yielding a sleep efficiency of 83.6%. Sleep onset after lights out was 34.7 minutes with a REM latency of 83.5 minutes. The patient spent 5.0% of the night in stage N1 sleep, 74.8% in stage N2 sleep, 2.3% in stage N3 and 17.9% in REM. Wake after sleep onset (WASO) was 26.3 minutes. The Arousal Index was 11.4/hour. RESPIRATORY PARAMETERS There were a total of 3 respiratory disturbances out of which 1 were apneas ( 1  obstructive, 0 mixed, 0 central) and 2 hypopneas. The apnea/hypopnea index (AHI) was 0.6 events/hour. The central sleep apnea index was 0 events/hour. The REM AHI was 1.1 events/hour and NREM AHI was 0.5 events/hour. The supine AHI was N/A events/hour and the non supine AHI was 0.6 supine during 0.00% of sleep. Respiratory disturbances were associated with oxygen desaturation down to a nadir of 93.0% during sleep. The mean oxygen saturation during the study was 96.7%. The cumulative time under 88% oxygen saturation was 5.5 minutes.  LEG MOVEMENT DATA The total leg movements were 0 with a resulting leg movement index of 0.0/hr .Associated arousal with leg movement index was 0.0/hr.  CARDIAC DATA The underlying cardiac rhythm was most consistent with sinus rhythm. Mean heart rate during sleep was 79.1 bpm. Additional rhythm abnormalities include None. IMPRESSIONS - No Significant Obstructive Sleep apnea(OSA) - EKG showed no cardiac abnormalities. - No significant Oxygen Desaturation - The patient snored with soft snoring volume. - No significant periodic leg movements(PLMs) during sleep. However, no significant associated arousals. DIAGNOSIS - Negative for significant sleep disordered breathing RECOMMENDATIONS - Avoid alcohol, sedatives and other CNS depressants that may worsen sleep apnea and disrupt normal sleep architecture. - Sleep hygiene should be reviewed to assess factors that may improve sleep quality. - Weight management and regular exercise should be initiated or continued.  [Electronically signed] 05/24/2022 05:35 AM  Sherrilyn Rist MD NPI: [id number]

## 2022-05-24 NOTE — Telephone Encounter (Signed)
Patient is returning phone call. Patient phone number is [phone].

## 2022-05-25 ENCOUNTER — Encounter: Payer: Self-pay | Admitting: *Deleted

## 2022-05-25 NOTE — Telephone Encounter (Signed)
ATC patient, returning patient's call regarding sleep study results.  No answer.  Left VM to return call if she has questions regarding results.  I also copied and pasted results per Dr. Ander Slade and sent mychart message to patient.  Will leave open to see if patient calls back.

## 2022-05-25 NOTE — Telephone Encounter (Signed)
Spoke with patient in regards to sleep study. She doesn't feel like test was accurate. She advises she did not sleep well that night and tech did not hook her up until 11. She states she feels frustrated and is curious as to the next steps from her. She says she snores loudly at night and feels like she has some issue going on.  Dr. Ander Slade please advise?

## 2022-05-26 NOTE — Telephone Encounter (Signed)
Patient Is returning phone call. Patient phone number is [phone].

## 2022-05-26 NOTE — Telephone Encounter (Signed)
Called and spoke with patient about results and AO's recommendations. She verbalized understanding. She stated that she is not interested in any surgical interventions nor a sleep aid. She is however interested in the oral device. She would like to have a referral to Dr. Oneal Grout to discuss the oral appliance.   Dr. Jenetta Downer, please advise if you are ok with this referral. Thanks!

## 2022-05-26 NOTE — Telephone Encounter (Signed)
Both of your studies have shown you do not have significant sleep apnea.  You slept better on the current study compared to the previous one.  A sleep study is the gold standard to show whether you have significant sleep apnea or not.  Some people snore and do not have significant sleep apnea. Snoring can be managed with an oral device or surgical interventions.  If you are not sleeping well through the night, sleep aid can be tried-may make your snoring a little worse-your most recent study showed that you slept for 83% of the recorded time in bed.  No other options for me to suggest based off of your 2 recent sleep studies.

## 2022-05-26 NOTE — Telephone Encounter (Signed)
ATC LVMTCB x 1  

## 2022-05-27 NOTE — Telephone Encounter (Signed)
Called and spoke with pt letting her know info per AO and she verbalized understanding. Referral placed. Nothing further needed.

## 2022-05-27 NOTE — Addendum Note (Signed)
Addended by: Lorretta Harp on: 05/27/2022 10:12 AM   Modules accepted: Orders

## 2022-05-27 NOTE — Telephone Encounter (Signed)
Okay to place referral

## 2022-06-24 ENCOUNTER — Institutional Professional Consult (permissible substitution): Payer: Self-pay | Admitting: Plastic Surgery

## 2022-06-29 ENCOUNTER — Other Ambulatory Visit: Payer: Self-pay | Admitting: Internal Medicine

## 2022-06-29 ENCOUNTER — Other Ambulatory Visit (HOSPITAL_COMMUNITY): Payer: Self-pay

## 2022-06-29 MED ORDER — HYDROXYCHLOROQUINE SULFATE 200 MG PO TABS
200.0000 mg | ORAL_TABLET | Freq: Every day | ORAL | 0 refills | Status: DC
  Filled 2022-06-29 – 2022-07-18 (×2): qty 90, 90d supply, fill #0

## 2022-06-30 ENCOUNTER — Other Ambulatory Visit (HOSPITAL_COMMUNITY): Payer: Self-pay

## 2022-06-30 MED ORDER — BELSOMRA 10 MG PO TABS
1.0000 | ORAL_TABLET | Freq: Every day | ORAL | 0 refills | Status: DC
  Filled 2022-06-30 – 2022-07-18 (×3): qty 30, 30d supply, fill #0

## 2022-06-30 NOTE — Telephone Encounter (Signed)
Requested medication (s) are due for refill today -yes  Requested medication (s) are on the active medication list -yes  Future visit scheduled -no  Last refill: 04/26/22 #30  Notes to clinic: off protocol- provider review   Requested Prescriptions  Pending Prescriptions Disp Refills   Suvorexant (BELSOMRA) 10 MG TABS 30 tablet 0    Sig: Take 1 tablet (10 mg total) by mouth at bedtime.     Off-Protocol Failed - 06/29/2022  3:00 PM      Failed - Medication not assigned to a protocol, review manually.      Passed - Valid encounter within last 12 months    Recent Outpatient Visits           4 months ago Screening for cervical cancer   [facility] [place], Coralie Keens, NP   4 months ago Pure hypercholesterolemia   [facility] [place], Coralie Keens, NP   11 months ago Encounter for general adult medical examination with abnormal findings   [facility] [place], Coralie Keens, NP   1 year ago Burning with urination   [facility] [place], Coralie Keens, NP   1 year ago [facility] [place], Coralie Keens, NP                 Requested Prescriptions  Pending Prescriptions Disp Refills   Suvorexant (BELSOMRA) 10 MG TABS 30 tablet 0    Sig: Take 1 tablet (10 mg total) by mouth at bedtime.     Off-Protocol Failed - 06/29/2022  3:00 PM      Failed - Medication not assigned to a protocol, review manually.      Passed - Valid encounter within last 12 months    Recent Outpatient Visits           4 months ago Screening for cervical cancer   [facility] [place], Coralie Keens, NP   4 months ago Pure hypercholesterolemia   [facility] [place], Coralie Keens, NP   11 months ago Encounter for general adult medical examination with abnormal findings   [facility] [place], Coralie Keens, NP   1 year ago  Burning with urination   [facility] [place], Coralie Keens, NP   1 year ago [facility] [place], Coralie Keens, [place]

## 2022-07-15 ENCOUNTER — Other Ambulatory Visit (HOSPITAL_COMMUNITY): Payer: Self-pay

## 2022-07-18 ENCOUNTER — Other Ambulatory Visit: Payer: Self-pay | Admitting: Internal Medicine

## 2022-07-18 ENCOUNTER — Other Ambulatory Visit (HOSPITAL_COMMUNITY): Payer: Self-pay

## 2022-07-18 DIAGNOSIS — A609 Anogenital herpesviral infection, unspecified: Secondary | ICD-10-CM

## 2022-07-19 ENCOUNTER — Other Ambulatory Visit (HOSPITAL_COMMUNITY): Payer: Self-pay

## 2022-07-19 MED ORDER — VALACYCLOVIR HCL 1 G PO TABS
500.0000 mg | ORAL_TABLET | ORAL | 0 refills | Status: DC
  Filled 2022-07-19: qty 90, 90d supply, fill #0

## 2022-07-19 NOTE — Telephone Encounter (Signed)
Requested Prescriptions  Pending Prescriptions Disp Refills   valACYclovir (VALTREX) 1000 MG tablet 90 tablet 0    Sig: Take 0.5 tablets (500 mg total) by mouth twice daily for 3 days for recurrent outbreaks and 1 tablet daily for suppressive therapy     Antimicrobials:  Antiviral Agents - Anti-Herpetic Passed - 07/18/2022  2:24 PM      Passed - Valid encounter within last 12 months    Recent Outpatient Visits           5 months ago Screening for cervical cancer   [facility] [place], Coralie Keens, NP   5 months ago Pure hypercholesterolemia   [facility] [place], Coralie Keens, NP   11 months ago Encounter for general adult medical examination with abnormal findings   [facility] [place], Coralie Keens, NP   1 year ago Burning with urination   [facility] [place], Coralie Keens, NP   1 year ago [facility] [place], Coralie Keens, [place]

## 2022-07-28 ENCOUNTER — Encounter: Payer: Self-pay | Admitting: Internal Medicine

## 2022-08-10 ENCOUNTER — Other Ambulatory Visit: Payer: Self-pay | Admitting: Internal Medicine

## 2022-08-10 DIAGNOSIS — Z1231 Encounter for screening mammogram for malignant neoplasm of breast: Secondary | ICD-10-CM

## 2022-08-11 ENCOUNTER — Other Ambulatory Visit: Payer: Self-pay | Admitting: Internal Medicine

## 2022-08-11 ENCOUNTER — Other Ambulatory Visit (HOSPITAL_COMMUNITY): Payer: Self-pay

## 2022-08-11 MED ORDER — CIPROFLOXACIN HCL 250 MG PO TABS
250.0000 mg | ORAL_TABLET | ORAL | 0 refills | Status: DC
  Filled 2022-08-11: qty 30, 30d supply, fill #0

## 2022-08-11 NOTE — Telephone Encounter (Signed)
Requested medication (s) are due for refill today - provider review   Requested medication (s) are on the active medication list -yes  Future visit scheduled -no  Last refill: 12/23/21 #30  Notes to clinic: off protocol- provider review   Requested Prescriptions  Pending Prescriptions Disp Refills   ciprofloxacin (CIPRO) 250 MG tablet 30 tablet 0    Sig: Take 1 tablet (250 mg total) by mouth once after intercourse     Off-Protocol Failed - 08/11/2022  6:57 AM      Failed - Medication not assigned to a protocol, review manually.      Passed - Valid encounter within last 12 months    Recent Outpatient Visits           5 months ago Screening for cervical cancer   [place] [facility] [place], Salvadore Oxford, NP   6 months ago Pure hypercholesterolemia   [place] [place], Salvadore Oxford, NP   1 year ago Encounter for general adult medical examination with abnormal findings   [place] [place], Salvadore Oxford, NP   1 year ago Burning with urination   [facility] [place], [place], NP   1 year ago Snoring   [place] [facility] [place], Salvadore Oxford, NP                 Requested Prescriptions  Pending Prescriptions Disp Refills   ciprofloxacin (CIPRO) 250 MG tablet 30 tablet 0    Sig: Take 1 tablet (250 mg total) by mouth once after intercourse     Off-Protocol Failed - 08/11/2022  6:57 AM      Failed - Medication not assigned to a protocol, review manually.      Passed - Valid encounter within last 12 months    Recent Outpatient Visits           5 months ago Screening for cervical cancer   [place] [facility] [place], Salvadore Oxford, NP   6 months ago Pure hypercholesterolemia   [place] [facility] [place], Salvadore Oxford, NP   1 year ago Encounter for general adult medical examination with abnormal findings   [place] [facility] [place], Salvadore Oxford, NP   1 year ago Burning with urination   [place] [facility] [place], Salvadore Oxford, NP   1 year ago Snoring   [place] [facility] [place], Salvadore Oxford, [place]

## 2022-08-18 ENCOUNTER — Other Ambulatory Visit (HOSPITAL_COMMUNITY): Payer: Self-pay

## 2022-08-19 ENCOUNTER — Other Ambulatory Visit (HOSPITAL_COMMUNITY): Payer: Self-pay

## 2022-08-29 ENCOUNTER — Ambulatory Visit: Payer: Commercial Managed Care - PPO | Admitting: Internal Medicine

## 2022-08-29 DIAGNOSIS — M3210 Systemic lupus erythematosus, organ or system involvement unspecified: Secondary | ICD-10-CM | POA: Diagnosis not present

## 2022-08-29 DIAGNOSIS — M329 Systemic lupus erythematosus, unspecified: Secondary | ICD-10-CM | POA: Diagnosis not present

## 2022-08-29 DIAGNOSIS — R5383 Other fatigue: Secondary | ICD-10-CM | POA: Diagnosis not present

## 2022-08-29 DIAGNOSIS — M3500 Sicca syndrome, unspecified: Secondary | ICD-10-CM | POA: Diagnosis not present

## 2022-08-29 DIAGNOSIS — G4700 Insomnia, unspecified: Secondary | ICD-10-CM | POA: Diagnosis not present

## 2022-08-29 DIAGNOSIS — Z79899 Other long term (current) drug therapy: Secondary | ICD-10-CM | POA: Diagnosis not present

## 2022-08-29 DIAGNOSIS — I7300 Raynaud's syndrome without gangrene: Secondary | ICD-10-CM | POA: Diagnosis not present

## 2022-08-30 ENCOUNTER — Other Ambulatory Visit (HOSPITAL_COMMUNITY): Payer: Self-pay

## 2022-08-30 ENCOUNTER — Other Ambulatory Visit: Payer: Self-pay | Admitting: Internal Medicine

## 2022-08-30 ENCOUNTER — Encounter: Payer: Self-pay | Admitting: *Deleted

## 2022-08-30 ENCOUNTER — Ambulatory Visit (INDEPENDENT_AMBULATORY_CARE_PROVIDER_SITE_OTHER): Payer: Self-pay | Admitting: Plastic Surgery

## 2022-08-30 ENCOUNTER — Encounter: Payer: Self-pay | Admitting: Plastic Surgery

## 2022-08-30 VITALS — BP 124/82 | HR 96 | Ht 67.5 in | Wt 165.2 lb

## 2022-08-30 DIAGNOSIS — R7303 Prediabetes: Secondary | ICD-10-CM

## 2022-08-30 DIAGNOSIS — E7800 Pure hypercholesterolemia, unspecified: Secondary | ICD-10-CM

## 2022-08-30 DIAGNOSIS — M3500 Sicca syndrome, unspecified: Secondary | ICD-10-CM

## 2022-08-30 DIAGNOSIS — M329 Systemic lupus erythematosus, unspecified: Secondary | ICD-10-CM

## 2022-08-30 DIAGNOSIS — Z719 Counseling, unspecified: Secondary | ICD-10-CM

## 2022-08-30 DIAGNOSIS — I7300 Raynaud's syndrome without gangrene: Secondary | ICD-10-CM

## 2022-08-30 LAB — LAB REPORT - SCANNED: EGFR: 73

## 2022-08-30 MED ORDER — TERBINAFINE HCL 250 MG PO TABS
250.0000 mg | ORAL_TABLET | Freq: Every day | ORAL | 0 refills | Status: DC
  Filled 2022-08-30: qty 90, 90d supply, fill #0

## 2022-08-30 NOTE — Progress Notes (Signed)
Patient ID: Cheryl Williams, female    DOB: 1965/05/17, 57 y.o.   MRN: [id number]   Chief Complaint  Patient presents with   Consult   Breast Problem    The patient is a 57 year old very pleasant lady.  She is 165 pounds 5 feet 7 inches tall.  She is interested in a mastopexy and may be a breast augmentation.  She her past medical history is positive for lupus for which she takes Plaquenil and also has Sjogren's disease.  She is watching her hemoglobin A1c.  She has had liposuction and a cholecystectomy in the past.  She is probably a B bra size.  She is very active and an avid cycler.  Her mammogram is up-to-date and the last one was negative.    Review of Systems  Constitutional: Negative.   HENT: Negative.    Eyes: Negative.   Respiratory: Negative.  Negative for chest tightness and shortness of breath.   Cardiovascular: Negative.   Gastrointestinal: Negative.   Endocrine: Negative.   Genitourinary: Negative.   Musculoskeletal: Negative.     Past Medical History:  Diagnosis Date   Anemia    Anxiety    claustrphobia   Chicken pox    GERD (gastroesophageal reflux disease)    IBS (irritable bowel syndrome)    Lupus (HCC)    Raynaud disease    Sjogrens syndrome (HCC)     Past Surgical History:  Procedure Laterality Date   BUNIONECTOMY Left    [facility]   CHOLECYSTECTOMY  2006      Current Outpatient Medications:    ciprofloxacin (CIPRO) 250 MG tablet, Take 1 tablet (250 mg total) by mouth once after intercourse, Disp: 30 tablet, Rfl: 0   cyclobenzaprine (FLEXERIL) 10 MG tablet, Take 1 tablet (10 mg total) by mouth 2 (two) times daily as needed for muscle spasms for up to 10 days, Disp: 20 tablet, Rfl: 0   Fezolinetant (VEOZAH) 45 MG TABS, Take one tablet (45 mg) by mouth daily., Disp: 90 tablet, Rfl: 1   fluocinonide ointment (LIDEX) 0.05 %, Apply to affected area on right leg twice daily x 2 weeks, then daily x 2 weeks. Not to face., Disp: 30 g, Rfl: 0    hydroxychloroquine (PLAQUENIL) 200 MG tablet, Take 1 tablet (200 mg total) by mouth daily with food or milk, Disp: 90 tablet, Rfl: 0   omeprazole (PRILOSEC) 20 MG capsule, Take 1 capsule (20 mg total) by mouth daily., Disp: 90 capsule, Rfl: 1   propranolol (INDERAL) 10 MG tablet, Take 1 tablet (10 mg total) by mouth daily as needed., Disp: 30 tablet, Rfl: 1   Suvorexant (BELSOMRA) 10 MG TABS, Take 1 tablet (10 mg total) by mouth at bedtime., Disp: 30 tablet, Rfl: 0   terbinafine (LAMISIL) 250 MG tablet, Take 1 tablet (250 mg total) by mouth daily for 3 months, Disp: 90 tablet, Rfl: 0   urea (CARMOL) 40 % CREA, Apply to the affected toe daily to twice daily as needed., Disp: 28.35 g, Rfl: 11   valACYclovir (VALTREX) 1000 MG tablet, Take 1/2 tablet (500 mg total) by mouth twice daily for 3 days for recurrent outbreaks and 1 tablet daily for suppressive therapy, Disp: 90 tablet, Rfl: 0   Objective:   Vitals:   08/30/22 0836  BP: 124/82  Pulse: 96  SpO2: 95%    Physical Exam Vitals and nursing note reviewed.  Constitutional:      Appearance: Normal appearance.  HENT:  Head: Normocephalic and atraumatic.  Cardiovascular:     Rate and Rhythm: Normal rate.     Pulses: Normal pulses.  Pulmonary:     Effort: Pulmonary effort is normal.  Skin:    General: Skin is warm.     Capillary Refill: Capillary refill takes less than 2 seconds.  Neurological:     Mental Status: She is alert and oriented to person, place, and time.     Assessment & Plan:  Prediabetes  Pure hypercholesterolemia  Sjogren's syndrome, with unspecified organ involvement (HCC)  Systemic lupus erythematosus, unspecified SLE type, unspecified organ involvement status (HCC)  Raynaud's disease without gangrene  Encounter for counseling  The patient is a candidate for bilateral mastopexy.  After I described the implants that she is probably not going to do that which I think is very smart.  My concern is with her  lupus that we could flare things up.  Also the implants would have to be exchanged in around 10 years.  I do recommend that she come off the Plaquenil a month before and a month after the surgery to assure healing if at all possible.  We will send her a quote and some information as well.  I also gave her the information for [employer] as another option for implants in her bra.  Pictures were obtained of the patient and placed in the chart with the patient's or guardian's permission.   Alena Bills Kery Haltiwanger, DO

## 2022-08-30 NOTE — Telephone Encounter (Signed)
Requested medication (s) are due for refill today: yes  Requested medication (s) are on the active medication list: no  Last refill:  01/06/20  Future visit scheduled: yes  Notes to clinic:  med is not on pt med list, please advise for refill.      Requested Prescriptions  Pending Prescriptions Disp Refills   estradiol (ESTRACE) 0.1 MG/GM vaginal cream 42.5 g 12    Sig: PLACE 1 APPLICATORFUL VAGINALLY 3 (THREE) TIMES A WEEK.     OB/GYN:  Estrogens Failed - 08/30/2022  5:07 PM      Failed - Mammogram is up-to-date per Health Maintenance      Passed - Last BP in normal range    BP Readings from Last 1 Encounters:  08/30/22 124/82         Passed - Valid encounter within last 12 months    Recent Outpatient Visits           6 months ago Screening for cervical cancer   [facility] [place], Salvadore Oxford, NP   6 months ago Pure hypercholesterolemia   [facility] [place], Salvadore Oxford, NP   1 year ago Encounter for general adult medical examination with abnormal findings   [place] [facility] [place], Salvadore Oxford, NP   1 year ago Burning with urination   [place] [facility] [place], Salvadore Oxford, NP   1 year ago Snoring   [place] [facility] [place], Salvadore Oxford, NP       Future Appointments             In 1 week [place], Salvadore Oxford, NP [facility], [facility]

## 2022-08-30 NOTE — Telephone Encounter (Signed)
Requested medication (s) are due for refill today: Yes  Requested medication (s) are on the active medication list: Yes  Last refill:  06/30/22  Future visit scheduled: Yes  Notes to clinic:  See request.    Requested Prescriptions  Pending Prescriptions Disp Refills   Suvorexant (BELSOMRA) 10 MG TABS 30 tablet 0    Sig: Take 1 tablet (10 mg total) by mouth at bedtime.     Off-Protocol Failed - 08/30/2022  2:42 PM      Failed - Medication not assigned to a protocol, review manually.      Passed - Valid encounter within last 12 months    Recent Outpatient Visits           6 months ago Screening for cervical cancer   [facility] [place], Salvadore Oxford, NP   6 months ago Pure hypercholesterolemia   [place] [place], Salvadore Oxford, NP   1 year ago Encounter for general adult medical examination with abnormal findings   [facility], Salvadore Oxford, NP   1 year ago Burning with urination   [facility] [place], Salvadore Oxford, NP   1 year ago Snoring   [facility] [place], Salvadore Oxford, NP       Future Appointments             In 1 week Sam[place] Si, Salvadore Oxford, NP [place], [facility]

## 2022-08-31 ENCOUNTER — Other Ambulatory Visit (HOSPITAL_COMMUNITY): Payer: Self-pay

## 2022-08-31 MED ORDER — BELSOMRA 10 MG PO TABS
1.0000 | ORAL_TABLET | Freq: Every day | ORAL | 0 refills | Status: DC
  Filled 2022-08-31: qty 30, 30d supply, fill #0

## 2022-09-06 ENCOUNTER — Encounter: Payer: Commercial Managed Care - PPO | Admitting: Internal Medicine

## 2022-09-06 DIAGNOSIS — Z1231 Encounter for screening mammogram for malignant neoplasm of breast: Secondary | ICD-10-CM

## 2022-09-06 NOTE — Progress Notes (Deleted)
Subjective:    Patient ID: Cheryl Williams, female    DOB: December 21, 1965, 57 y.o.   MRN: [id number]  HPI  Patient presents to clinic today for her annual exam.  Flu: 01/2022 Tetanus: COVID: Pfizer x 4 Shingrix: Never Pap smear: 01/2022 Mammogram: 07/2021 Colon screening: 04/2018 Vision screening: Dentist:  Diet: Exercise:  Review of Systems     Past Medical History:  Diagnosis Date   Anemia    Anxiety    claustrphobia   Chicken pox    GERD (gastroesophageal reflux disease)    IBS (irritable bowel syndrome)    Lupus (HCC)    Raynaud disease    Sjogrens syndrome (HCC)     Current Outpatient Medications  Medication Sig Dispense Refill   ciprofloxacin (CIPRO) 250 MG tablet Take 1 tablet (250 mg total) by mouth once after intercourse 30 tablet 0   cyclobenzaprine (FLEXERIL) 10 MG tablet Take 1 tablet (10 mg total) by mouth 2 (two) times daily as needed for muscle spasms for up to 10 days 20 tablet 0   Fezolinetant (VEOZAH) 45 MG TABS Take one tablet (45 mg) by mouth daily. 90 tablet 1   fluocinonide ointment (LIDEX) 0.05 % Apply to affected area on right leg twice daily x 2 weeks, then daily x 2 weeks. Not to face. 30 g 0   hydroxychloroquine (PLAQUENIL) 200 MG tablet Take 1 tablet (200 mg total) by mouth daily with food or milk 90 tablet 0   omeprazole (PRILOSEC) 20 MG capsule Take 1 capsule (20 mg total) by mouth daily. 90 capsule 1   propranolol (INDERAL) 10 MG tablet Take 1 tablet (10 mg total) by mouth daily as needed. 30 tablet 1   Suvorexant (BELSOMRA) 10 MG TABS Take 1 tablet (10 mg total) by mouth at bedtime. 30 tablet 0   terbinafine (LAMISIL) 250 MG tablet Take 1 tablet (250 mg total) by mouth daily for 3 months 90 tablet 0   urea (CARMOL) 40 % CREA Apply to the affected toe daily to twice daily as needed. 28.35 g 11   valACYclovir (VALTREX) 1000 MG tablet Take 1/2 tablet (500 mg total) by mouth twice daily for 3 days for recurrent outbreaks and 1 tablet  daily for suppressive therapy 90 tablet 0   No current facility-administered medications for this visit.    Allergies  Allergen Reactions   Bactrim [Sulfamethoxazole-Trimethoprim] Hives   Trimethoprim Hives   Imuran [Azathioprine] Other (See Comments)    Mental hallucinations   Penicillins      Rash and hives at age 25    Family History  Problem Relation Age of Onset   Hyperlipidemia Mother    Hypertension Mother    Deafness Mother    Colon polyps Mother    Alcohol abuse Father    Deafness Father    Drug abuse Sister    Hypertension Sister    Drug abuse Brother    Rheum arthritis Maternal Aunt    Lupus Maternal Aunt    Rheum arthritis Maternal Grandmother    Hypertension Sister    Colon cancer Neg Hx    Esophageal cancer Neg Hx    Rectal cancer Neg Hx    Stomach cancer Neg Hx     Social History   Socioeconomic History   Marital status: Divorced    Spouse name: Not on file   Number of children: Not on file   Years of education: Not on file   Highest education level: Not on file  Occupational History   Not on file  Tobacco Use   Smoking status: Never   Smokeless tobacco: Never  Substance and Sexual Activity   Alcohol use: Yes    Alcohol/week: 0.0 standard drinks of alcohol    Comment: occasional--moderate   Drug use: No   Sexual activity: Yes  Other Topics Concern   Not on file  Social History Narrative   Not on file   Social Determinants of Health   Financial Resource Strain: Not on file  Food Insecurity: Not on file  Transportation Needs: Not on file  Physical Activity: Not on file  Stress: Not on file  Social Connections: Not on file  Intimate Partner Violence: Not on file     Constitutional: Denies fever, malaise, fatigue, headache or abrupt weight changes.  HEENT: Denies eye pain, eye redness, ear pain, ringing in the ears, wax buildup, runny nose, nasal congestion, bloody nose, or sore throat. Respiratory: Denies difficulty breathing,  shortness of breath, cough or sputum production.   Cardiovascular: Denies chest pain, chest tightness, palpitations or swelling in the hands or feet.  Gastrointestinal: Denies abdominal pain, bloating, constipation, diarrhea or blood in the stool.  GU: Denies urgency, frequency, pain with urination, burning sensation, blood in urine, odor or discharge. Musculoskeletal: Denies decrease in range of motion, difficulty with gait, muscle pain or joint pain and swelling.  Skin: Denies redness, rashes, lesions or ulcercations.  Neurological: Patient reports insomnia.  Denies dizziness, difficulty with memory, difficulty with speech or problems with balance and coordination.  Psych: Patient has a history of anxiety.  Denies depression, SI/HI.  No other specific complaints in a complete review of systems (except as listed in HPI above).  Objective:   Physical Exam   LMP 06/17/2019  Wt Readings from Last 3 Encounters:  08/30/22 165 lb 3.2 oz (74.9 kg)  05/11/22 150 lb (68 kg)  02/15/22 152 lb (68.9 kg)    General: Appears their stated age, well developed, well nourished in NAD. Skin: Warm, dry and intact. No rashes, lesions or ulcerations noted. HEENT: Head: normal shape and size; Eyes: sclera white, no icterus, conjunctiva pink, PERRLA and EOMs intact; Ears: Tm's gray and intact, normal light reflex; Nose: mucosa pink and moist, septum midline; Throat/Mouth: Teeth present, mucosa pink and moist, no exudate, lesions or ulcerations noted.  Neck:  Neck supple, trachea midline. No masses, lumps or thyromegaly present.  Cardiovascular: Normal rate and rhythm. S1,S2 noted.  No murmur, rubs or gallops noted. No JVD or BLE edema. No carotid bruits noted. Pulmonary/Chest: Normal effort and positive vesicular breath sounds. No respiratory distress. No wheezes, rales or ronchi noted.  Abdomen: Soft and nontender. Normal bowel sounds. No distention or masses noted. Liver, spleen and kidneys non  palpable. Musculoskeletal: Normal range of motion. No signs of joint swelling. No difficulty with gait.  Neurological: Alert and oriented. Cranial nerves II-XII grossly intact. Coordination normal.  Psychiatric: Mood and affect normal. Behavior is normal. Judgment and thought content normal.    BMET    Component Value Date/Time   NA 139 02/08/2022 1129   K 3.9 02/08/2022 1129   CL 103 02/08/2022 1129   CO2 28 02/08/2022 1129   GLUCOSE 85 02/08/2022 1129   BUN 21 02/08/2022 1129   CREATININE 0.79 02/08/2022 1129   CALCIUM 9.4 02/08/2022 1129    Lipid Panel     Component Value Date/Time   CHOL 200 (H) 02/08/2022 1129   TRIG 81 02/08/2022 1129   HDL 69 02/08/2022  1129   CHOLHDL 2.9 02/08/2022 1129   VLDL 14.4 05/26/2020 0955   LDLCALC 113 (H) 02/08/2022 1129    CBC    Component Value Date/Time   WBC 4.6 02/08/2022 1129   RBC 3.77 (L) 02/08/2022 1129   HGB 10.9 (L) 02/08/2022 1129   HCT 33.8 (L) 02/08/2022 1129   PLT 227 02/08/2022 1129   MCV 89.7 02/08/2022 1129   MCH 28.9 02/08/2022 1129   MCHC 32.2 02/08/2022 1129   RDW 13.5 02/08/2022 1129    Hgb A1C Lab Results  Component Value Date   HGBA1C 5.9 (H) 02/08/2022           Assessment & Plan:   Preventative Health Maintenance:  Encouraged her to get a flu shot in the fall Tetanus Encouraged her to get her COVID booster Discussed Shingrix vaccine, she will check coverage with her insurance company and schedule visit if she would like to have this done Pap smear UTD Mammogram previously ordered-she just needs to call and schedule Colon screening UTD Encouraged her to consume a balanced diet and exercise regimen Advised her to see an eye doctor and dentist annually We will check CBC, c-Met, lipid, A1c today  RTC in 6 months, follow-up chronic conditions Nicki Reaper, NP

## 2022-09-12 ENCOUNTER — Encounter: Payer: Self-pay | Admitting: Internal Medicine

## 2022-09-13 ENCOUNTER — Other Ambulatory Visit (HOSPITAL_COMMUNITY): Payer: Self-pay

## 2022-09-13 MED ORDER — ESTRADIOL 0.1 MG/GM VA CREA
1.0000 | TOPICAL_CREAM | Freq: Every day | VAGINAL | 2 refills | Status: DC
  Filled 2022-09-13: qty 42.5, 30d supply, fill #0
  Filled 2022-10-11: qty 42.5, 30d supply, fill #1

## 2022-09-16 ENCOUNTER — Ambulatory Visit (INDEPENDENT_AMBULATORY_CARE_PROVIDER_SITE_OTHER): Payer: Commercial Managed Care - PPO | Admitting: Internal Medicine

## 2022-09-16 ENCOUNTER — Encounter: Payer: Self-pay | Admitting: Internal Medicine

## 2022-09-16 VITALS — BP 106/64 | HR 84 | Temp 97.1°F | Ht 67.0 in | Wt 159.0 lb

## 2022-09-16 DIAGNOSIS — E7800 Pure hypercholesterolemia, unspecified: Secondary | ICD-10-CM

## 2022-09-16 DIAGNOSIS — R7303 Prediabetes: Secondary | ICD-10-CM | POA: Diagnosis not present

## 2022-09-16 DIAGNOSIS — Z0001 Encounter for general adult medical examination with abnormal findings: Secondary | ICD-10-CM | POA: Diagnosis not present

## 2022-09-16 NOTE — Progress Notes (Signed)
Subjective:    Patient ID: Cheryl Williams, female    DOB: 17-Aug-1965, 57 y.o.   MRN: [id number]  HPI  Patient presents to clinic today for annual exam.  Flu: 01/2022 Tetanus: unsure COVID: Pfizer x 5 Shingrix: Never Pap smear: 01/2022 Mammogram: 07/2021 Colon screening: 04/2018 Vision screening: annually Dentist: biannually  Diet: She does eat meat. She consumes fruits and veggies. She does eat some fried foods. She drinks mostly water. Exercise: Ride a bike, weights  Review of Systems     Past Medical History:  Diagnosis Date   Anemia    Anxiety    claustrphobia   Chicken pox    GERD (gastroesophageal reflux disease)    IBS (irritable bowel syndrome)    Lupus (HCC)    Raynaud disease    Sjogrens syndrome (HCC)     Current Outpatient Medications  Medication Sig Dispense Refill   ciprofloxacin (CIPRO) 250 MG tablet Take 1 tablet (250 mg total) by mouth once after intercourse 30 tablet 0   estradiol (ESTRACE) 0.1 MG/GM vaginal cream Insert 1 gram vaginally at bedtime as directed 42.5 g 2   fluocinonide ointment (LIDEX) 0.05 % Apply to affected area on right leg twice daily x 2 weeks, then daily x 2 weeks. Not to face. 30 g 0   hydroxychloroquine (PLAQUENIL) 200 MG tablet Take 1 tablet (200 mg total) by mouth daily with food or milk 90 tablet 0   omeprazole (PRILOSEC) 20 MG capsule Take 1 capsule (20 mg total) by mouth daily. 90 capsule 1   propranolol (INDERAL) 10 MG tablet Take 1 tablet (10 mg total) by mouth daily as needed. 30 tablet 1   Suvorexant (BELSOMRA) 10 MG TABS Take 1 tablet (10 mg total) by mouth at bedtime. 30 tablet 0   terbinafine (LAMISIL) 250 MG tablet Take 1 tablet (250 mg total) by mouth daily for 3 months 90 tablet 0   valACYclovir (VALTREX) 1000 MG tablet Take 1/2 tablet (500 mg total) by mouth twice daily for 3 days for recurrent outbreaks and 1 tablet daily for suppressive therapy 90 tablet 0   No current facility-administered medications  for this visit.    Allergies  Allergen Reactions   Bactrim [Sulfamethoxazole-Trimethoprim] Hives   Trimethoprim Hives   Imuran [Azathioprine] Other (See Comments)    Mental hallucinations   Penicillins      Rash and hives at age 89    Family History  Problem Relation Age of Onset   Hyperlipidemia Mother    Hypertension Mother    Deafness Mother    Colon polyps Mother    Alcohol abuse Father    Deafness Father    Drug abuse Sister    Hypertension Sister    Drug abuse Brother    Rheum arthritis Maternal Aunt    Lupus Maternal Aunt    Rheum arthritis Maternal Grandmother    Hypertension Sister    Colon cancer Neg Hx    Esophageal cancer Neg Hx    Rectal cancer Neg Hx    Stomach cancer Neg Hx     Social History   Socioeconomic History   Marital status: Divorced    Spouse name: Not on file   Number of children: Not on file   Years of education: Not on file   Highest education level: Not on file  Occupational History   Not on file  Tobacco Use   Smoking status: Never   Smokeless tobacco: Never  Substance and Sexual Activity  Alcohol use: Yes    Alcohol/week: 0.0 standard drinks of alcohol    Comment: occasional--moderate   Drug use: No   Sexual activity: Yes  Other Topics Concern   Not on file  Social History Narrative   Not on file   Social Determinants of Health   Financial Resource Strain: Not on file  Food Insecurity: Not on file  Transportation Needs: Not on file  Physical Activity: Not on file  Stress: Not on file  Social Connections: Not on file  Intimate Partner Violence: Not on file     Constitutional: Denies fever, malaise, fatigue, headache or abrupt weight changes.  HEENT: Denies eye pain, eye redness, ear pain, ringing in the ears, wax buildup, runny nose, nasal congestion, bloody nose, or sore throat. Respiratory: Denies difficulty breathing, shortness of breath, cough or sputum production.   Cardiovascular: Denies chest pain, chest  tightness, palpitations or swelling in the hands or feet.  Gastrointestinal: Pt reports intermittent reflux. Denies abdominal pain, bloating, constipation, diarrhea or blood in the stool.  GU: Denies urgency, frequency, pain with urination, burning sensation, blood in urine, odor or discharge. Musculoskeletal: Denies decrease in range of motion, difficulty with gait, muscle pain or joint pain and swelling.  Skin: Denies redness, rashes, lesions or ulcercations.  Neurological: Patient reports hot flashes, insomnia.  Denies dizziness, difficulty with memory, difficulty with speech or problems with balance and coordination.  Psych: Patient reports anxiety.  Denies depression, SI/HI.  No other specific complaints in a complete review of systems (except as listed in HPI above).  Objective:   Physical Exam  BP 106/64 (BP Location: Left Arm, Patient Position: Sitting, Cuff Size: Normal)   Pulse 84   Temp (!) 97.1 F (36.2 C) (Temporal)   Ht 5\' 7"  (1.702 m)   Wt 159 lb (72.1 kg)   LMP 06/17/2019   BMI 24.90 kg/m   Wt Readings from Last 3 Encounters:  08/30/22 165 lb 3.2 oz (74.9 kg)  05/11/22 150 lb (68 kg)  02/15/22 152 lb (68.9 kg)    General: Appears her stated age, well developed, well nourished in NAD. Skin: Warm, dry and intact. No rashes noted. HEENT: Head: normal shape and size; Eyes: sclera white, no icterus, conjunctiva pink, PERRLA and EOMs intact;  Neck:  Neck supple, trachea midline. No masses, lumps or thyromegaly present.  Cardiovascular: Normal rate and rhythm. S1,S2 noted.  No murmur, rubs or gallops noted. No JVD or BLE edema. No carotid bruits noted. Pulmonary/Chest: Normal effort and positive vesicular breath sounds. No respiratory distress. No wheezes, rales or ronchi noted.  Abdomen:. Normal bowel sounds.  Musculoskeletal: Strength 5/5 BUE/BLE.  No difficulty with gait.  Neurological: Alert and oriented. Cranial nerves II-XII grossly intact. Coordination normal.   Psychiatric: Mood and affect normal. Behavior is normal. Judgment and thought content normal.    BMET    Component Value Date/Time   NA 139 02/08/2022 1129   K 3.9 02/08/2022 1129   CL 103 02/08/2022 1129   CO2 28 02/08/2022 1129   GLUCOSE 85 02/08/2022 1129   BUN 21 02/08/2022 1129   CREATININE 0.79 02/08/2022 1129   CALCIUM 9.4 02/08/2022 1129    Lipid Panel     Component Value Date/Time   CHOL 200 (H) 02/08/2022 1129   TRIG 81 02/08/2022 1129   HDL 69 02/08/2022 1129   CHOLHDL 2.9 02/08/2022 1129   VLDL 14.4 05/26/2020 0955   LDLCALC 113 (H) 02/08/2022 1129    CBC  Component Value Date/Time   WBC 4.6 02/08/2022 1129   RBC 3.77 (L) 02/08/2022 1129   HGB 10.9 (L) 02/08/2022 1129   HCT 33.8 (L) 02/08/2022 1129   PLT 227 02/08/2022 1129   MCV 89.7 02/08/2022 1129   MCH 28.9 02/08/2022 1129   MCHC 32.2 02/08/2022 1129   RDW 13.5 02/08/2022 1129    Hgb A1C Lab Results  Component Value Date   HGBA1C 5.9 (H) 02/08/2022           Assessment & Plan:   Preventative Health Maintenance:  Encouraged her to get a flu shot in the fall Tetanus declined today Encouraged her to get her COVID booster Discussed Shingrix vaccine, she will check coverage with her insurance company and schedule a visit if she would like to have this done Pap smear UTD Mammogram ordered-she will call to schedule Colon screening UTD Encouraged her to consume a balanced diet and exercise regimen Advised her to see an eye doctor and dentist annually We will check lipid, A1c today  RTC in 6 months, follow-up chronic conditions Nicki Reaper, NP

## 2022-09-16 NOTE — Patient Instructions (Signed)
Health Maintenance for Postmenopausal Women Menopause is a normal process in which your ability to get pregnant comes to an end. This process happens slowly over many months or years, usually between the ages of 48 and 55. Menopause is complete when you have missed your menstrual period for 12 months. It is important to talk with your health care provider about some of the most common conditions that affect women after menopause (postmenopausal women). These include heart disease, cancer, and bone loss (osteoporosis). Adopting a healthy lifestyle and getting preventive care can help to promote your health and wellness. The actions you take can also lower your chances of developing some of these common conditions. What are the signs and symptoms of menopause? During menopause, you may have the following symptoms: Hot flashes. These can be moderate or severe. Night sweats. Decrease in sex drive. Mood swings. Headaches. Tiredness (fatigue). Irritability. Memory problems. Problems falling asleep or staying asleep. Talk with your health care provider about treatment options for your symptoms. Do I need hormone replacement therapy? Hormone replacement therapy is effective in treating symptoms that are caused by menopause, such as hot flashes and night sweats. Hormone replacement carries certain risks, especially as you become older. If you are thinking about using estrogen or estrogen with progestin, discuss the benefits and risks with your health care provider. How can I reduce my risk for heart disease and stroke? The risk of heart disease, heart attack, and stroke increases as you age. One of the causes may be a change in the body's hormones during menopause. This can affect how your body uses dietary fats, triglycerides, and cholesterol. Heart attack and stroke are medical emergencies. There are many things that you can do to help prevent heart disease and stroke. Watch your blood pressure High  blood pressure causes heart disease and increases the risk of stroke. This is more likely to develop in people who have high blood pressure readings or are overweight. Have your blood pressure checked: Every 3-5 years if you are 18-39 years of age. Every year if you are 40 years old or older. Eat a healthy diet  Eat a diet that includes plenty of vegetables, fruits, low-fat dairy products, and lean protein. Do not eat a lot of foods that are high in solid fats, added sugars, or sodium. Get regular exercise Get regular exercise. This is one of the most important things you can do for your health. Most adults should: Try to exercise for at least 150 minutes each week. The exercise should increase your heart rate and make you sweat (moderate-intensity exercise). Try to do strengthening exercises at least twice each week. Do these in addition to the moderate-intensity exercise. Spend less time sitting. Even light physical activity can be beneficial. Other tips Work with your health care provider to achieve or maintain a healthy weight. Do not use any products that contain nicotine or tobacco. These products include cigarettes, chewing tobacco, and vaping devices, such as e-cigarettes. If you need help quitting, ask your health care provider. Know your numbers. Ask your health care provider to check your cholesterol and your blood sugar (glucose). Continue to have your blood tested as directed by your health care provider. Do I need screening for cancer? Depending on your health history and family history, you may need to have cancer screenings at different stages of your life. This may include screening for: Breast cancer. Cervical cancer. Lung cancer. Colorectal cancer. What is my risk for osteoporosis? After menopause, you may be   at increased risk for osteoporosis. Osteoporosis is a condition in which bone destruction happens more quickly than new bone creation. To help prevent osteoporosis or  the bone fractures that can happen because of osteoporosis, you may take the following actions: If you are 19-50 years old, get at least 1,000 mg of calcium and at least 600 international units (IU) of vitamin D per day. If you are older than age 50 but younger than age 70, get at least 1,200 mg of calcium and at least 600 international units (IU) of vitamin D per day. If you are older than age 70, get at least 1,200 mg of calcium and at least 800 international units (IU) of vitamin D per day. Smoking and drinking excessive alcohol increase the risk of osteoporosis. Eat foods that are rich in calcium and vitamin D, and do weight-bearing exercises several times each week as directed by your health care provider. How does menopause affect my mental health? Depression may occur at any age, but it is more common as you become older. Common symptoms of depression include: Feeling depressed. Changes in sleep patterns. Changes in appetite or eating patterns. Feeling an overall lack of motivation or enjoyment of activities that you previously enjoyed. Frequent crying spells. Talk with your health care provider if you think that you are experiencing any of these symptoms. General instructions See your health care provider for regular wellness exams and vaccines. This may include: Scheduling regular health, dental, and eye exams. Getting and maintaining your vaccines. These include: Influenza vaccine. Get this vaccine each year before the flu season begins. Pneumonia vaccine. Shingles vaccine. Tetanus, diphtheria, and pertussis (Tdap) booster vaccine. Your health care provider may also recommend other immunizations. Tell your health care provider if you have ever been abused or do not feel safe at home. Summary Menopause is a normal process in which your ability to get pregnant comes to an end. This condition causes hot flashes, night sweats, decreased interest in sex, mood swings, headaches, or lack  of sleep. Treatment for this condition may include hormone replacement therapy. Take actions to keep yourself healthy, including exercising regularly, eating a healthy diet, watching your weight, and checking your blood pressure and blood sugar levels. Get screened for cancer and depression. Make sure that you are up to date with all your vaccines. This information is not intended to replace advice given to you by your health care provider. Make sure you discuss any questions you have with your health care provider. Document Revised: 08/24/2020 Document Reviewed: 08/24/2020 Elsevier Patient Education  2024 Elsevier Inc.  

## 2022-09-17 LAB — LIPID PANEL
Cholesterol: 214 mg/dL — ABNORMAL HIGH (ref <200)
HDL: 68 mg/dL (ref >=50)
LDL Cholesterol (Calc): 125 mg/dL (calc) — ABNORMAL HIGH
Non-HDL Cholesterol (Calc): 146 mg/dL (calc) — ABNORMAL HIGH (ref <130)
Total CHOL/HDL Ratio: 3.1 (calc) (ref <5.0)
Triglycerides: 100 mg/dL (ref <150)

## 2022-09-17 LAB — HEMOGLOBIN A1C
Hgb A1c MFr Bld: 6 % of total Hgb — ABNORMAL HIGH (ref <5.7)
Mean Plasma Glucose: 126 mg/dL
eAG (mmol/L): 7 mmol/L

## 2022-10-11 ENCOUNTER — Other Ambulatory Visit: Payer: Self-pay | Admitting: Internal Medicine

## 2022-10-11 ENCOUNTER — Encounter: Payer: Self-pay | Admitting: Internal Medicine

## 2022-10-11 ENCOUNTER — Other Ambulatory Visit (HOSPITAL_COMMUNITY): Payer: Self-pay

## 2022-10-11 ENCOUNTER — Other Ambulatory Visit: Payer: Self-pay

## 2022-10-11 DIAGNOSIS — E7800 Pure hypercholesterolemia, unspecified: Secondary | ICD-10-CM

## 2022-10-12 ENCOUNTER — Other Ambulatory Visit (HOSPITAL_COMMUNITY): Payer: Self-pay

## 2022-10-12 MED ORDER — SIMVASTATIN 10 MG PO TABS
10.0000 mg | ORAL_TABLET | Freq: Every day | ORAL | 1 refills | Status: DC
  Filled 2022-10-12: qty 90, 90d supply, fill #0

## 2022-10-12 MED ORDER — HYDROXYCHLOROQUINE SULFATE 200 MG PO TABS
200.0000 mg | ORAL_TABLET | Freq: Every day | ORAL | 0 refills | Status: DC
  Filled 2022-10-12: qty 90, 90d supply, fill #0

## 2022-10-12 NOTE — Telephone Encounter (Signed)
Requested medication (s) are due for refill today: yes  Requested medication (s) are on the active medication list: yes  Last refill:  08/31/22  Future visit scheduled: yes  Notes to clinic:  Medication not assigned to a protocol, review manually.      Requested Prescriptions  Pending Prescriptions Disp Refills   Suvorexant (BELSOMRA) 10 MG TABS 30 tablet 0    Sig: Take 1 tablet (10 mg total) by mouth at bedtime.     Off-Protocol Failed - 10/11/2022  6:23 PM      Failed - Medication not assigned to a protocol, review manually.      Passed - Valid encounter within last 12 months    Recent Outpatient Visits           3 weeks ago Encounter for general adult medical examination with abnormal findings   [place] [facility] [place], Salvadore Oxford, NP   7 months ago Screening for cervical cancer   [place] [facility] [place], Salvadore Oxford, NP   8 months ago Pure hypercholesterolemia   [place] [facility] [place], Salvadore Oxford, NP   1 year ago Encounter for general adult medical examination with abnormal findings   [place] [facility] [place], Salvadore Oxford, NP   1 year ago Burning with urination   [place] [facility] [place], Salvadore Oxford, NP       Future Appointments             In 5 months Baity, Salvadore Oxford, NP [facility], [facility]

## 2022-10-13 ENCOUNTER — Other Ambulatory Visit: Payer: Self-pay | Admitting: Internal Medicine

## 2022-10-13 ENCOUNTER — Other Ambulatory Visit (HOSPITAL_COMMUNITY): Payer: Self-pay

## 2022-10-13 MED ORDER — BELSOMRA 10 MG PO TABS
1.0000 | ORAL_TABLET | Freq: Every day | ORAL | 0 refills | Status: DC
  Filled 2022-10-13: qty 30, 30d supply, fill #0

## 2022-10-13 NOTE — Telephone Encounter (Signed)
Requested medication (s) are due for refill today - yes  Requested medication (s) are on the active medication list -yes  Future visit scheduled -yes  Last refill: 08/11/22 #30   Notes to clinic: off protocol - provider review   Requested Prescriptions  Pending Prescriptions Disp Refills   ciprofloxacin (CIPRO) 250 MG tablet 30 tablet 0    Sig: Take 1 tablet (250 mg total) by mouth once after intercourse     Off-Protocol Failed - 10/13/2022  2:25 PM      Failed - Medication not assigned to a protocol, review manually.      Passed - Valid encounter within last 12 months    Recent Outpatient Visits           3 weeks ago Encounter for general adult medical examination with abnormal findings   [place] [facility] [place], Salvadore Oxford, NP   8 months ago Screening for cervical cancer   [place] [facility] [place], Salvadore Oxford, NP   8 months ago Pure hypercholesterolemia   [place] [facility] [place], Salvadore Oxford, NP   1 year ago Encounter for general adult medical examination with abnormal findings   [place] [facility] [place], Salvadore Oxford, NP   1 year ago Burning with urination   [place] [facility] [place], Salvadore Oxford, NP       Future Appointments             In 5 months [place], Salvadore Oxford, NP [facility], [facility]               Requested Prescriptions  Pending Prescriptions Disp Refills   ciprofloxacin (CIPRO) 250 MG tablet 30 tablet 0    Sig: Take 1 tablet (250 mg total) by mouth once after intercourse     Off-Protocol Failed - 10/13/2022  2:25 PM      Failed - Medication not assigned to a protocol, review manually.      Passed - Valid encounter within last 12 months    Recent Outpatient Visits           3 weeks ago Encounter for general adult medical examination with abnormal findings   [place] [facility] [place], Salvadore Oxford, NP   8 months ago  Screening for cervical cancer   [place] [facility] [place], Salvadore Oxford, NP   8 months ago Pure hypercholesterolemia   [place] [facility] [place], Salvadore Oxford, NP   1 year ago Encounter for general adult medical examination with abnormal findings   [place] [address] [facility] [place], Salvadore Oxford, NP   1 year ago Burning with urination   [place] [facility] [place], Salvadore Oxford, NP       Future Appointments             In 5 months Baity, Salvadore Oxford, NP [place] [facility], [facility]

## 2022-10-14 ENCOUNTER — Other Ambulatory Visit (HOSPITAL_COMMUNITY): Payer: Self-pay

## 2022-10-14 NOTE — Telephone Encounter (Signed)
Pt takes 1 tablet have intercourse and also after riding a bike.    Thanks,   -Vernona Rieger

## 2022-10-14 NOTE — Telephone Encounter (Signed)
Can you call patient and ask her how often she is taking this?

## 2022-11-28 ENCOUNTER — Other Ambulatory Visit: Payer: Self-pay | Admitting: Internal Medicine

## 2022-11-30 NOTE — Telephone Encounter (Signed)
Requested medication (s) are due for refill today: Yes  Requested medication (s) are on the active medication list: Yes  Last refill:  Cipro 08/11/22, Suvorexant 10/13/22  Future visit scheduled: Yes  Notes to clinic:  Unable to refill per protocol, medication not assigned to the refill protocol.      Requested Prescriptions  Pending Prescriptions Disp Refills   ciprofloxacin (CIPRO) 250 MG tablet 30 tablet 0    Sig: Take 1 tablet (250 mg total) by mouth once after intercourse     Off-Protocol Failed - 11/28/2022  4:26 PM      Failed - Medication not assigned to a protocol, review manually.      Passed - Valid encounter within last 12 months    Recent Outpatient Visits           2 months ago Encounter for general adult medical examination with abnormal findings   [place] [facility] [place], Salvadore Oxford, NP   9 months ago Screening for cervical cancer   [place] [facility] [place], Salvadore Oxford, NP   9 months ago Pure hypercholesterolemia   [place] [place], Salvadore Oxford, NP   1 year ago Encounter for general adult medical examination with abnormal findings   [place] [facility] [place], Salvadore Oxford, NP   1 year ago Burning with urination   [place] [facility] [place], Salvadore Oxford, NP       Future Appointments             In 3 months Sampson Si, Salvadore Oxford, NP [place] [facility], [facility]             Suvorexant (BELSOMRA) 10 MG TABS 30 tablet 0    Sig: Take 1 tablet (10 mg total) by mouth at bedtime.     Off-Protocol Failed - 11/28/2022  4:26 PM      Failed - Medication not assigned to a protocol, review manually.      Passed - Valid encounter within last 12 months    Recent Outpatient Visits           2 months ago Encounter for general adult medical examination with abnormal findings   [place] [facility] [place], Salvadore Oxford, NP   9 months ago  Screening for cervical cancer   [place] [facility] [place], Salvadore Oxford, NP   9 months ago Pure hypercholesterolemia   [place] [facility] [place], Salvadore Oxford, NP   1 year ago Encounter for general adult medical examination with abnormal findings   [place] [facility] [place], Salvadore Oxford, NP   1 year ago Burning with urination   [place] [facility] [place], Salvadore Oxford, NP       Future Appointments             In 3 months Baity, Salvadore Oxford, NP [place] [facility], [facility]

## 2022-12-01 ENCOUNTER — Other Ambulatory Visit (HOSPITAL_COMMUNITY): Payer: Self-pay

## 2022-12-01 MED ORDER — CIPROFLOXACIN HCL 250 MG PO TABS
250.0000 mg | ORAL_TABLET | ORAL | 0 refills | Status: DC
  Filled 2022-12-01: qty 30, 30d supply, fill #0

## 2022-12-01 MED ORDER — BELSOMRA 10 MG PO TABS
1.0000 | ORAL_TABLET | Freq: Every day | ORAL | 0 refills | Status: DC
  Filled 2022-12-01 (×2): qty 30, 30d supply, fill #0

## 2022-12-06 ENCOUNTER — Other Ambulatory Visit (HOSPITAL_COMMUNITY): Payer: Self-pay

## 2023-01-09 ENCOUNTER — Other Ambulatory Visit: Payer: Self-pay | Admitting: Internal Medicine

## 2023-01-10 ENCOUNTER — Other Ambulatory Visit (HOSPITAL_COMMUNITY): Payer: Self-pay

## 2023-01-10 MED ORDER — BELSOMRA 10 MG PO TABS
1.0000 | ORAL_TABLET | Freq: Every day | ORAL | 0 refills | Status: DC
  Filled 2023-01-10 – 2023-02-06 (×2): qty 30, 30d supply, fill #0

## 2023-01-10 NOTE — Telephone Encounter (Signed)
Requested medication (s) are due for refill today -yes  Requested medication (s) are on the active medication list -yes  Future visit scheduled -yes  Last refill: 12/01/22 #30  Notes to clinic: off protocol- provider review   Requested Prescriptions  Pending Prescriptions Disp Refills   Suvorexant (BELSOMRA) 10 MG TABS 30 tablet 0    Sig: Take 1 tablet (10 mg total) by mouth at bedtime.     Off-Protocol Failed - 01/09/2023  1:22 PM      Failed - Medication not assigned to a protocol, review manually.      Passed - Valid encounter within last 12 months    Recent Outpatient Visits           3 months ago Encounter for general adult medical examination with abnormal findings   [place] [facility] [place], Salvadore Oxford, NP   10 months ago Screening for cervical cancer   [place] [facility] [place], Salvadore Oxford, NP   11 months ago Pure hypercholesterolemia   [place] [facility] [place], Salvadore Oxford, NP   1 year ago Encounter for general adult medical examination with abnormal findings   [place] [facility] [place], Salvadore Oxford, NP   1 year ago Burning with urination   [place] [facility] [place], Salvadore Oxford, NP       Future Appointments             In 2 months Sam[place] Si, Salvadore Oxford, NP [place] [facility], [facility]               Requested Prescriptions  Pending Prescriptions Disp Refills   Suvorexant (BELSOMRA) 10 MG TABS 30 tablet 0    Sig: Take 1 tablet (10 mg total) by mouth at bedtime.     Off-Protocol Failed - 01/09/2023  1:22 PM      Failed - Medication not assigned to a protocol, review manually.      Passed - Valid encounter within last 12 months    Recent Outpatient Visits           3 months ago Encounter for general adult medical examination with abnormal findings   [employer] [facility] [place], Salvadore Oxford, NP   10 months ago Screening for cervical cancer    [place] [facility] [place], Salvadore Oxford, NP   11 months ago Pure hypercholesterolemia   [place] [facility] [place], Salvadore Oxford, NP   1 year ago Encounter for general adult medical examination with abnormal findings   [place] [facility] [place], Salvadore Oxford, NP   1 year ago Burning with urination   [place] [facility] [place], Salvadore Oxford, NP       Future Appointments             In 2 months Baity, Salvadore Oxford, NP  [facility], [facility]

## 2023-01-20 ENCOUNTER — Other Ambulatory Visit (HOSPITAL_COMMUNITY): Payer: Self-pay

## 2023-01-28 ENCOUNTER — Ambulatory Visit
Admission: RE | Admit: 2023-01-28 | Discharge: 2023-01-28 | Disposition: A | Discharge status: Home / Self Care | Payer: Commercial Managed Care - PPO | Source: Ambulatory Visit | Attending: Internal Medicine | Admitting: Internal Medicine

## 2023-01-28 DIAGNOSIS — Z1231 Encounter for screening mammogram for malignant neoplasm of breast: Secondary | ICD-10-CM | POA: Diagnosis not present

## 2023-01-28 LAB — AMB RESULTS CONSOLE CBG: Glucose: 89

## 2023-02-06 ENCOUNTER — Other Ambulatory Visit (HOSPITAL_COMMUNITY): Payer: Self-pay

## 2023-02-06 MED ORDER — HYDROXYCHLOROQUINE SULFATE 200 MG PO TABS
200.0000 mg | ORAL_TABLET | Freq: Every day | ORAL | 0 refills | Status: DC
  Filled 2023-02-06: qty 90, 90d supply, fill #0

## 2023-02-07 ENCOUNTER — Other Ambulatory Visit: Payer: Self-pay

## 2023-02-07 ENCOUNTER — Other Ambulatory Visit (HOSPITAL_COMMUNITY): Payer: Self-pay

## 2023-02-16 ENCOUNTER — Other Ambulatory Visit (HOSPITAL_COMMUNITY): Payer: Self-pay

## 2023-02-16 MED ORDER — INFLUENZA VIRUS VACC SPLIT PF (FLUZONE) 0.5 ML IM SUSY
0.5000 mL | PREFILLED_SYRINGE | Freq: Once | INTRAMUSCULAR | 0 refills | Status: AC
  Filled 2023-02-16: qty 0.5, 1d supply, fill #0

## 2023-02-16 MED ORDER — COVID-19 MRNA VAC-TRIS(PFIZER) 30 MCG/0.3ML IM SUSY
0.3000 mL | PREFILLED_SYRINGE | Freq: Once | INTRAMUSCULAR | 0 refills | Status: AC
  Filled 2023-02-16: qty 0.3, 1d supply, fill #0

## 2023-02-17 ENCOUNTER — Other Ambulatory Visit (HOSPITAL_COMMUNITY): Payer: Self-pay

## 2023-03-01 DIAGNOSIS — Z79899 Other long term (current) drug therapy: Secondary | ICD-10-CM | POA: Diagnosis not present

## 2023-03-01 DIAGNOSIS — M79601 Pain in right arm: Secondary | ICD-10-CM | POA: Diagnosis not present

## 2023-03-01 DIAGNOSIS — G4700 Insomnia, unspecified: Secondary | ICD-10-CM | POA: Diagnosis not present

## 2023-03-01 DIAGNOSIS — M329 Systemic lupus erythematosus, unspecified: Secondary | ICD-10-CM | POA: Diagnosis not present

## 2023-03-09 ENCOUNTER — Other Ambulatory Visit (HOSPITAL_COMMUNITY): Payer: Self-pay

## 2023-03-09 DIAGNOSIS — L819 Disorder of pigmentation, unspecified: Secondary | ICD-10-CM | POA: Diagnosis not present

## 2023-03-09 DIAGNOSIS — B351 Tinea unguium: Secondary | ICD-10-CM | POA: Diagnosis not present

## 2023-03-09 DIAGNOSIS — L680 Hirsutism: Secondary | ICD-10-CM | POA: Diagnosis not present

## 2023-03-09 DIAGNOSIS — L905 Scar conditions and fibrosis of skin: Secondary | ICD-10-CM | POA: Diagnosis not present

## 2023-03-09 DIAGNOSIS — L249 Irritant contact dermatitis, unspecified cause: Secondary | ICD-10-CM | POA: Diagnosis not present

## 2023-03-09 MED ORDER — TRETINOIN 0.025 % EX CREA
1.0000 | TOPICAL_CREAM | Freq: Every evening | CUTANEOUS | 2 refills | Status: AC
  Filled 2023-03-09: qty 45, 30d supply, fill #0
  Filled 2023-04-03 – 2023-04-26 (×2): qty 45, 30d supply, fill #1
  Filled 2023-06-15: qty 45, 30d supply, fill #2

## 2023-03-09 MED ORDER — CICLOPIROX 8 % EX SOLN
1.0000 | Freq: Every day | CUTANEOUS | 11 refills | Status: DC
  Filled 2023-03-09: qty 6.6, 30d supply, fill #0
  Filled 2023-04-03 – 2023-04-26 (×2): qty 6.6, 30d supply, fill #1
  Filled 2023-06-15: qty 6.6, 30d supply, fill #2
  Filled 2023-08-12: qty 6.6, 30d supply, fill #3
  Filled 2023-10-09: qty 6.6, 30d supply, fill #4
  Filled 2023-11-07: qty 6.6, 30d supply, fill #5

## 2023-03-22 ENCOUNTER — Ambulatory Visit: Payer: Commercial Managed Care - PPO | Admitting: Internal Medicine

## 2023-04-03 ENCOUNTER — Other Ambulatory Visit: Payer: Self-pay | Admitting: Internal Medicine

## 2023-04-03 ENCOUNTER — Other Ambulatory Visit: Payer: Self-pay

## 2023-04-03 ENCOUNTER — Other Ambulatory Visit (HOSPITAL_COMMUNITY): Payer: Self-pay

## 2023-04-03 DIAGNOSIS — A609 Anogenital herpesviral infection, unspecified: Secondary | ICD-10-CM

## 2023-04-03 MED ORDER — HYDROXYCHLOROQUINE SULFATE 200 MG PO TABS
200.0000 mg | ORAL_TABLET | Freq: Every day | ORAL | 0 refills | Status: DC
  Filled 2023-04-03 – 2023-04-26 (×2): qty 90, 90d supply, fill #0

## 2023-04-04 ENCOUNTER — Other Ambulatory Visit (HOSPITAL_COMMUNITY): Payer: Self-pay

## 2023-04-04 MED ORDER — VALACYCLOVIR HCL 1 G PO TABS
500.0000 mg | ORAL_TABLET | ORAL | 1 refills | Status: DC
  Filled 2023-04-04 – 2023-04-26 (×2): qty 90, 90d supply, fill #0
  Filled 2023-08-30: qty 90, 90d supply, fill #1

## 2023-04-04 MED ORDER — BELSOMRA 10 MG PO TABS
1.0000 | ORAL_TABLET | Freq: Every day | ORAL | 0 refills | Status: DC
Start: 2023-04-04 — End: 2023-07-12
  Filled 2023-04-04 – 2023-04-26 (×2): qty 30, 30d supply, fill #0

## 2023-04-04 NOTE — Telephone Encounter (Signed)
Requested Prescriptions  Pending Prescriptions Disp Refills   valACYclovir (VALTREX) 1000 MG tablet 90 tablet 0    Sig: Take 1/2 tablet (500 mg total) by mouth twice daily for 3 days for recurrent outbreaks and 1 tablet daily for suppressive therapy     Antimicrobials:  Antiviral Agents - Anti-Herpetic Passed - 04/04/2023 10:40 AM      Passed - Valid encounter within last 12 months    Recent Outpatient Visits           6 months ago Encounter for general adult medical examination with abnormal findings   [facility] [place], Salvadore [place], NP   1 year ago Screening for cervical cancer   [place] [facility] [place], Salvadore Oxford, NP   1 year ago Pure hypercholesterolemia   [place] [facility] [place], Salvadore Oxford, NP   1 year ago Encounter for general adult medical examination with abnormal findings   [place] [place], Salvadore Oxford, NP   2 years ago Burning with urination   [place] [facility] [place], [place], NP               Suvorexant (BELSOMRA) 10 MG TABS 30 tablet 0    Sig: Take 1 tablet (10 mg total) by mouth at bedtime.     Off-Protocol Failed - 04/04/2023 10:40 AM      Failed - Medication not assigned to a protocol, review manually.      Passed - Valid encounter within last 12 months    Recent Outpatient Visits           6 months ago Encounter for general adult medical examination with abnormal findings   [place] [facility] [place], Salvadore Oxford, NP   1 year ago Screening for cervical cancer   [facility] [place], Salvadore Oxford, NP   1 year ago Pure hypercholesterolemia   [place] [facility] [place], Salvadore Oxford, NP   1 year ago Encounter for general adult medical examination with abnormal findings   [place] [facility] [place], Salvadore Oxford, NP   2 years ago Burning with urination   [place] [place], Salv[place], [place]

## 2023-04-04 NOTE — Telephone Encounter (Signed)
Requested medications are due for refill today.  yes  Requested medications are on the active medications list.  yes  Last refill. 01/10/2023 #30 0 rf  Future visit scheduled.   no  Notes to clinic.  Refill not delegated.    Requested Prescriptions  Pending Prescriptions Disp Refills   Suvorexant (BELSOMRA) 10 MG TABS 30 tablet 0    Sig: Take 1 tablet (10 mg total) by mouth at bedtime.     Off-Protocol Failed - 04/04/2023 10:40 AM      Failed - Medication not assigned to a protocol, review manually.      Passed - Valid encounter within last 12 months    Recent Outpatient Visits           6 months ago Encounter for general adult medical examination with abnormal findings   [place] [facility] [place], Salvadore Oxford, NP   1 year ago Screening for cervical cancer   [facility] [facility] [place], Salvadore Oxford, NP   1 year ago Pure hypercholesterolemia   [place] [facility] [place], Salvadore Oxford, NP   1 year ago Encounter for general adult medical examination with abnormal findings   [place] [place], Salvadore Oxford, NP   2 years ago Burning with urination   [place] [facility] [place], Salvadore Oxford, NP              Signed Prescriptions Disp Refills   valACYclovir (VALTREX) 1000 MG tablet 90 tablet 1    Sig: Take 1/2 tablet (500 mg total) by mouth twice daily for 3 days for recurrent outbreaks and 1 tablet daily for suppressive therapy     Antimicrobials:  Antiviral Agents - Anti-Herpetic Passed - 04/04/2023 10:40 AM      Passed - Valid encounter within last 12 months    Recent Outpatient Visits           6 months ago Encounter for general adult medical examination with abnormal findings   [place] [facility] [place], Salvadore Oxford, NP   1 year ago Screening for cervical cancer   [facility] [place], Salvadore Oxford, NP   1 year ago Pure hypercholesterolemia    [place] [facility] [place], Salvadore Oxford, NP   1 year ago Encounter for general adult medical examination with abnormal findings   [place] [facility] [place], Salvadore Oxford, NP   2 years ago Burning with urination   [place] [facility] [place], Salv[place], [place]

## 2023-04-06 ENCOUNTER — Ambulatory Visit: Payer: Commercial Managed Care - PPO | Admitting: Internal Medicine

## 2023-04-13 ENCOUNTER — Other Ambulatory Visit (HOSPITAL_COMMUNITY): Payer: Self-pay

## 2023-04-14 ENCOUNTER — Other Ambulatory Visit (HOSPITAL_COMMUNITY): Payer: Self-pay

## 2023-04-26 ENCOUNTER — Other Ambulatory Visit (HOSPITAL_COMMUNITY): Payer: Self-pay

## 2023-04-26 ENCOUNTER — Other Ambulatory Visit: Payer: Self-pay | Admitting: Internal Medicine

## 2023-04-26 ENCOUNTER — Other Ambulatory Visit: Payer: Self-pay

## 2023-04-28 ENCOUNTER — Other Ambulatory Visit (HOSPITAL_COMMUNITY): Payer: Self-pay

## 2023-04-28 MED ORDER — CIPROFLOXACIN HCL 250 MG PO TABS
250.0000 mg | ORAL_TABLET | ORAL | 0 refills | Status: AC
  Filled 2023-04-28: qty 30, 30d supply, fill #0

## 2023-04-28 NOTE — Telephone Encounter (Signed)
 Requested medication (s) are due for refill today - yes  Requested medication (s) are on the active medication list -yes  Future visit scheduled -yes  Last refill: 12/01/22 #30  Notes to clinic: off protocol- provider review    Requested Prescriptions  Pending Prescriptions Disp Refills   ciprofloxacin  (CIPRO ) 250 MG tablet 30 tablet 0    Sig: Take 1 tablet (250 mg total) by mouth once after intercourse     Off-Protocol Failed - 04/28/2023  8:32 AM      Failed - Medication not assigned to a protocol, review manually.      Passed - Valid encounter within last 12 months    Recent Outpatient Visits           7 months ago Encounter for general adult medical examination with abnormal findings   [place] [facility] [place], Angeline ORN, NP   1 year ago Screening for cervical cancer   [place] [facility] [place], Angeline ORN, NP   1 year ago Pure hypercholesterolemia   [place] [facility] [place], Angeline ORN, NP   1 year ago Encounter for general adult medical examination with abnormal findings   [place] [facility] [place], Angeline ORN, NP   2 years ago Burning with urination   [place] [facility] [place], Angeline ORN, NP                 Requested Prescriptions  Pending Prescriptions Disp Refills   ciprofloxacin  (CIPRO ) 250 MG tablet 30 tablet 0    Sig: Take 1 tablet (250 mg total) by mouth once after intercourse     Off-Protocol Failed - 04/28/2023  8:32 AM      Failed - Medication not assigned to a protocol, review manually.      Passed - Valid encounter within last 12 months    Recent Outpatient Visits           7 months ago Encounter for general adult medical examination with abnormal findings   [place] [facility] [place], Angeline ORN, NP   1 year ago Screening for cervical cancer   [place] [facility] [place], Angeline ORN, NP   1 year ago Pure  hypercholesterolemia   [place] [facility] [place], Angeline ORN, NP   1 year ago Encounter for general adult medical examination with abnormal findings   [place] [facility] [place], Angeline ORN, NP   2 years ago Burning with urination   [place] [facility] [place], Angeline ORN, [place]

## 2023-06-15 ENCOUNTER — Other Ambulatory Visit: Payer: Self-pay

## 2023-06-15 ENCOUNTER — Other Ambulatory Visit: Payer: Self-pay | Admitting: Internal Medicine

## 2023-06-16 ENCOUNTER — Other Ambulatory Visit (HOSPITAL_COMMUNITY): Payer: Self-pay

## 2023-06-16 ENCOUNTER — Encounter (HOSPITAL_COMMUNITY): Payer: Self-pay

## 2023-06-16 NOTE — Telephone Encounter (Signed)
 Requested medication (s) are due for refill today: Yes  Requested medication (s) are on the active medication list: Yes  Last refill:  04/04/23  Future visit scheduled: No  Notes to clinic:  Unable to refill due to no refill protocol for this medication.      Requested Prescriptions  Pending Prescriptions Disp Refills   Suvorexant (BELSOMRA) 10 MG TABS 30 tablet 0    Sig: Take 1 tablet (10 mg total) by mouth at bedtime.     Off-Protocol Failed - 06/16/2023  3:36 PM      Failed - Medication not assigned to a protocol, review manually.      Passed - Valid encounter within last 12 months    Recent Outpatient Visits           9 months ago Encounter for general adult medical examination with abnormal findings   [place] [facility] [place], Salvadore Oxford, NP   1 year ago Screening for cervical cancer   [place] [facility] [place], Salvadore Oxford, NP   1 year ago Pure hypercholesterolemia   [place] [facility] [place], Salvadore Oxford, NP   1 year ago Encounter for general adult medical examination with abnormal findings   [place] [facility] [place], Salvadore Oxford, NP   2 years ago Burning with urination   [place] [facility] [place], Salvadore Oxford, [place]

## 2023-06-21 ENCOUNTER — Other Ambulatory Visit (HOSPITAL_COMMUNITY): Payer: Self-pay

## 2023-06-28 ENCOUNTER — Ambulatory Visit: Admitting: Internal Medicine

## 2023-06-28 ENCOUNTER — Other Ambulatory Visit (HOSPITAL_COMMUNITY): Payer: Self-pay

## 2023-06-28 ENCOUNTER — Encounter: Payer: Self-pay | Admitting: Internal Medicine

## 2023-06-28 VITALS — BP 102/64 | Ht 67.0 in | Wt 151.2 lb

## 2023-06-28 DIAGNOSIS — B009 Herpesviral infection, unspecified: Secondary | ICD-10-CM

## 2023-06-28 DIAGNOSIS — R7303 Prediabetes: Secondary | ICD-10-CM

## 2023-06-28 DIAGNOSIS — F411 Generalized anxiety disorder: Secondary | ICD-10-CM

## 2023-06-28 DIAGNOSIS — D508 Other iron deficiency anemias: Secondary | ICD-10-CM | POA: Diagnosis not present

## 2023-06-28 DIAGNOSIS — E7800 Pure hypercholesterolemia, unspecified: Secondary | ICD-10-CM

## 2023-06-28 DIAGNOSIS — M329 Systemic lupus erythematosus, unspecified: Secondary | ICD-10-CM

## 2023-06-28 DIAGNOSIS — N390 Urinary tract infection, site not specified: Secondary | ICD-10-CM

## 2023-06-28 DIAGNOSIS — F5104 Psychophysiologic insomnia: Secondary | ICD-10-CM | POA: Diagnosis not present

## 2023-06-28 DIAGNOSIS — N951 Menopausal and female climacteric states: Secondary | ICD-10-CM | POA: Diagnosis not present

## 2023-06-28 DIAGNOSIS — M3500 Sicca syndrome, unspecified: Secondary | ICD-10-CM

## 2023-06-28 DIAGNOSIS — I7300 Raynaud's syndrome without gangrene: Secondary | ICD-10-CM

## 2023-06-28 DIAGNOSIS — K219 Gastro-esophageal reflux disease without esophagitis: Secondary | ICD-10-CM | POA: Diagnosis not present

## 2023-06-28 MED ORDER — NORGESTIM-ETH ESTRAD TRIPHASIC 0.18/0.215/0.25 MG-35 MCG PO TABS
1.0000 | ORAL_TABLET | Freq: Every day | ORAL | 1 refills | Status: DC
  Filled 2023-06-28: qty 84, 84d supply, fill #0
  Filled 2023-08-25 – 2023-09-04 (×3): qty 84, 84d supply, fill #1

## 2023-06-28 NOTE — Assessment & Plan Note (Signed)
 Continue omeprazole as needed

## 2023-06-28 NOTE — Patient Instructions (Signed)

## 2023-06-28 NOTE — Assessment & Plan Note (Signed)
 Continue belsomra or if too expensive can try melatonin up to 10 mg at bedtime

## 2023-06-28 NOTE — Assessment & Plan Note (Signed)
Continue propanolol as needed

## 2023-06-28 NOTE — Progress Notes (Signed)
 Subjective:    Patient ID: Cheryl Williams, female    DOB: Sep 02, 1965, 58 y.o.   MRN: [id number]  HPI  Patient presents to clinic today for follow-up of chronic conditions.  Lupus/sjogren's: Managed on plaquenil.  She follows with rheumatology.  Anxiety: Triggered by flying and public speaking.  She takes propanolol as needed.  She is not currently seeing a therapist.  She denies depression, SI/HI.  GERD: Intermittent.  She takes omeprazole as needed.  There is no upper GI on file.  Insomnia: She has difficulty falling asleep.  She is taking belsomra as prescribed with good relief of symptoms but reports it is very expensive.  Sleep study from 02/2021 reviewed.  Genital herpes: She denies recent outbreak.  She takes valacyclovir only as needed.  Raynaud's: She denies any discoloration or pain of her fingers/toes.  She is not currently taking any medications for this.  Anemia: Her last H/H was 10.9/33.8, 01/2022.  She is not taking any oral iron at this time.  She does not follow with hematology.  HLD: Her last LDL was 125, triglycerides 100, 08/2022.  She is not currently taking simvastatin.  She tries to consume a low-fat diet.  Prediabetes: Her last A1c was 6%, 08/2022.  She is not taking any oral diabetic medication at this time.  She does not check her sugars.  Postcoital UTI: Managed with cipro as needed.  She does not follow with urogynecology.  Review of Systems     Past Medical History:  Diagnosis Date   Anemia    Anxiety    claustrphobia   Chicken pox    GERD (gastroesophageal reflux disease)    IBS (irritable bowel syndrome)    Lupus    Raynaud disease    Sjogrens syndrome (HCC)     Current Outpatient Medications  Medication Sig Dispense Refill   ciclopirox (PENLAC) 8 % solution Apply topically at bedtime. Apply over nail and surrounding skin. Apply daily over previous coat. After seven (7) days, may remove with alcohol and continue cycle. 6.6 mL 11    ciprofloxacin (CIPRO) 250 MG tablet Take 1 tablet (250 mg total) by mouth once after intercourse 30 tablet 0   estradiol (ESTRACE) 0.1 MG/GM vaginal cream Insert 1 gram vaginally at bedtime as directed 42.5 g 2   fluocinonide ointment (LIDEX) 0.05 % Apply to affected area on right leg twice daily x 2 weeks, then daily x 2 weeks. Not to face. 30 g 0   hydroxychloroquine (PLAQUENIL) 200 MG tablet Take 1 tablet (200 mg total) by mouth daily with food or milk. 90 tablet 0   omeprazole (PRILOSEC) 20 MG capsule Take 1 capsule (20 mg total) by mouth daily. 90 capsule 1   propranolol (INDERAL) 10 MG tablet Take 1 tablet (10 mg total) by mouth daily as needed. 30 tablet 1   simvastatin (ZOCOR) 10 MG tablet Take 1 tablet (10 mg total) by mouth at bedtime. 90 tablet 1   Suvorexant (BELSOMRA) 10 MG TABS Take 1 tablet (10 mg total) by mouth at bedtime. 30 tablet 0   terbinafine (LAMISIL) 250 MG tablet Take 1 tablet (250 mg total) by mouth daily for 3 months 90 tablet 0   tretinoin (RETIN-A) 0.025 % cream Apply 1 application topically to face nightly. 45 g 2   valACYclovir (VALTREX) 1000 MG tablet Take 1/2 tablet (500 mg total) by mouth twice daily for 3 days for recurrent outbreaks and 1 tablet daily for suppressive therapy. 90 tablet 1  No current facility-administered medications for this visit.    Allergies  Allergen Reactions   Bactrim [Sulfamethoxazole-Trimethoprim] Hives   Trimethoprim Hives   Imuran [Azathioprine] Other (See Comments)    Mental hallucinations   Penicillins      Rash and hives at age 25    Family History  Problem Relation Age of Onset   Hyperlipidemia Mother    Hypertension Mother    Deafness Mother    Colon polyps Mother    Alcohol abuse Father    Deafness Father    Drug abuse Sister    Hypertension Sister    Drug abuse Brother    Rheum arthritis Maternal Aunt    Lupus Maternal Aunt    Rheum arthritis Maternal Grandmother    Hypertension Sister    Colon cancer Neg  Hx    Esophageal cancer Neg Hx    Rectal cancer Neg Hx    Stomach cancer Neg Hx     Social History   Socioeconomic History   Marital status: Divorced    Spouse name: Not on file   Number of children: Not on file   Years of education: Not on file   Highest education level: Not on file  Occupational History   Not on file  Tobacco Use   Smoking status: Never   Smokeless tobacco: Never  Substance and Sexual Activity   Alcohol use: Yes    Alcohol/week: 0.0 standard drinks of alcohol    Comment: occasional--moderate   Drug use: No   Sexual activity: Yes  Other Topics Concern   Not on file  Social History Narrative   Not on file   Social Drivers of Health   Financial Resource Strain: Not on file  Food Insecurity: Not on file  Transportation Needs: Not on file  Physical Activity: Not on file  Stress: Not on file  Social Connections: Not on file  Intimate Partner Violence: Not on file     Constitutional: Denies fever, malaise, fatigue, headache or abrupt weight changes.  HEENT: Denies eye pain, eye redness, ear pain, ringing in the ears, wax buildup, runny nose, nasal congestion, bloody nose, or sore throat. Respiratory: Denies difficulty breathing, shortness of breath, cough or sputum production.   Cardiovascular: Denies chest pain, chest tightness, palpitations or swelling in the hands or feet.  Gastrointestinal: Denies abdominal pain, bloating, constipation, diarrhea or blood in the stool.  GU: Denies urgency, frequency, pain with urination, burning sensation, blood in urine, odor or discharge. Musculoskeletal: Denies decrease in range of motion, difficulty with gait, muscle pain or joint pain and swelling.  Skin: Denies redness, rashes, lesions or ulcercations.  Neurological: Patient reports insomnia, hot flashes.  Denies dizziness, difficulty with memory, difficulty with speech or problems with balance and coordination.  Psych: Patient has a history of anxiety.  Denies  depression, SI/HI.  No other specific complaints in a complete review of systems (except as listed in HPI above).  Objective:   Physical Exam BP 102/64 (BP Location: Right Arm, Patient Position: Sitting, Cuff Size: Normal)   Ht 5\' 7"  (1.702 m)   Wt 151 lb 3.2 oz (68.6 kg)   LMP 06/17/2019   BMI 23.68 kg/m    Wt Readings from Last 3 Encounters:  09/16/22 159 lb (72.1 kg)  08/30/22 165 lb 3.2 oz (74.9 kg)  05/11/22 150 lb (68 kg)    General: Appears her stated age, well developed, well nourished in NAD. Skin: Warm, dry and intact.  HEENT: Head: normal shape and  size; Eyes: sclera white, no icterus, conjunctiva pink, PERRLA and EOMs intact;  Cardiovascular: Normal rate and rhythm. S1,S2 noted.  No murmur, rubs or gallops noted. No JVD or BLE edema. No carotid bruits noted. Pulmonary/Chest: Normal effort and positive vesicular breath sounds. No respiratory distress. No wheezes, rales or ronchi noted.  Musculoskeletal:   No difficulty with gait.  Neurological: Alert and oriented. Cranial nerves II-XII grossly intact. Coordination normal.     BMET    Component Value Date/Time   NA 139 02/08/2022 1129   K 3.9 02/08/2022 1129   CL 103 02/08/2022 1129   CO2 28 02/08/2022 1129   GLUCOSE 85 02/08/2022 1129   BUN 21 02/08/2022 1129   CREATININE 0.79 02/08/2022 1129   CALCIUM 9.4 02/08/2022 1129    Lipid Panel     Component Value Date/Time   CHOL 214 (H) 09/16/2022 1357   TRIG 100 09/16/2022 1357   HDL 68 09/16/2022 1357   CHOLHDL 3.1 09/16/2022 1357   VLDL 14.4 05/26/2020 0955   LDLCALC 125 (H) 09/16/2022 1357    CBC    Component Value Date/Time   WBC 4.6 02/08/2022 1129   RBC 3.77 (L) 02/08/2022 1129   HGB 10.9 (L) 02/08/2022 1129   HCT 33.8 (L) 02/08/2022 1129   PLT 227 02/08/2022 1129   MCV 89.7 02/08/2022 1129   MCH 28.9 02/08/2022 1129   MCHC 32.2 02/08/2022 1129   RDW 13.5 02/08/2022 1129    Hgb A1C Lab Results  Component Value Date   HGBA1C 6.0 (H)  09/16/2022            Assessment & Plan:     RTC in 6 months for your annual exam Nicki Reaper, NP

## 2023-06-28 NOTE — Assessment & Plan Note (Signed)
Continue Cipro as needed

## 2023-06-28 NOTE — Assessment & Plan Note (Signed)
Continue plaquenil per rheumatology

## 2023-06-28 NOTE — Assessment & Plan Note (Signed)
 CBC today.

## 2023-06-28 NOTE — Assessment & Plan Note (Signed)
 Continue valacyclovir as needed

## 2023-06-28 NOTE — Assessment & Plan Note (Signed)
Encourage low-carb diet and exercise for weight loss A1c today 

## 2023-06-28 NOTE — Assessment & Plan Note (Signed)
 C-Met and lipid profile today Encourage her to consume a low-fat diet She may need to restart simvastatin based on labs

## 2023-06-28 NOTE — Assessment & Plan Note (Signed)
Not treated We will monitor 

## 2023-06-28 NOTE — Assessment & Plan Note (Signed)
 Will trial Tri-Sprintec

## 2023-06-29 ENCOUNTER — Other Ambulatory Visit (HOSPITAL_COMMUNITY): Payer: Self-pay

## 2023-06-29 ENCOUNTER — Encounter: Payer: Self-pay | Admitting: Internal Medicine

## 2023-06-29 LAB — COMPLETE METABOLIC PANEL WITH GFR
AG Ratio: 1.3 (calc) (ref 1.0–2.5)
ALT: 18 U/L (ref 6–29)
AST: 23 U/L (ref 10–35)
Albumin: 4.8 g/dL (ref 3.6–5.1)
Alkaline phosphatase (APISO): 67 U/L (ref 37–153)
BUN: 18 mg/dL (ref 7–25)
CO2: 30 mmol/L (ref 20–32)
Calcium: 10.1 mg/dL (ref 8.6–10.4)
Chloride: 102 mmol/L (ref 98–110)
Creat: 0.86 mg/dL (ref 0.50–1.03)
Globulin: 3.6 g/dL (ref 1.9–3.7)
Glucose, Bld: 86 mg/dL (ref 65–99)
Potassium: 4 mmol/L (ref 3.5–5.3)
Sodium: 139 mmol/L (ref 135–146)
Total Bilirubin: 0.5 mg/dL (ref 0.2–1.2)
Total Protein: 8.4 g/dL — ABNORMAL HIGH (ref 6.1–8.1)
eGFR: 80 mL/min/{1.73_m2} (ref >=60)

## 2023-06-29 LAB — CBC
HCT: 35.7 % (ref 35.0–45.0)
Hemoglobin: 11.8 g/dL (ref 11.7–15.5)
MCH: 29.6 pg (ref 27.0–33.0)
MCHC: 33.1 g/dL (ref 32.0–36.0)
MCV: 89.5 fL (ref 80.0–100.0)
MPV: 10.6 fL (ref 7.5–12.5)
Platelets: 230 10*3/uL (ref 140–400)
RBC: 3.99 10*6/uL (ref 3.80–5.10)
RDW: 13 % (ref 11.0–15.0)
WBC: 3.1 10*3/uL — ABNORMAL LOW (ref 3.8–10.8)

## 2023-06-29 LAB — LIPID PANEL
Cholesterol: 205 mg/dL — ABNORMAL HIGH (ref <200)
HDL: 76 mg/dL (ref >=50)
LDL Cholesterol (Calc): 114 mg/dL — ABNORMAL HIGH
Non-HDL Cholesterol (Calc): 129 mg/dL (ref <130)
Total CHOL/HDL Ratio: 2.7 (calc) (ref <5.0)
Triglycerides: 63 mg/dL (ref <150)

## 2023-06-29 LAB — HEMOGLOBIN A1C
Hgb A1c MFr Bld: 6.1 %{Hb} — ABNORMAL HIGH (ref <5.7)
Mean Plasma Glucose: 128 mg/dL
eAG (mmol/L): 7.1 mmol/L

## 2023-06-30 ENCOUNTER — Encounter (HOSPITAL_COMMUNITY): Payer: Self-pay

## 2023-06-30 ENCOUNTER — Other Ambulatory Visit (HOSPITAL_COMMUNITY): Payer: Self-pay

## 2023-07-12 ENCOUNTER — Other Ambulatory Visit: Payer: Self-pay | Admitting: Internal Medicine

## 2023-07-13 ENCOUNTER — Other Ambulatory Visit (HOSPITAL_COMMUNITY): Payer: Self-pay

## 2023-07-13 MED ORDER — BELSOMRA 10 MG PO TABS
1.0000 | ORAL_TABLET | Freq: Every day | ORAL | 0 refills | Status: DC
Start: 2023-07-13 — End: 2023-08-12
  Filled 2023-07-13: qty 30, 30d supply, fill #0

## 2023-07-13 NOTE — Telephone Encounter (Signed)
 Requested medication (s) are due for refill today: Yes  Requested medication (s) are on the active medication list: Yes  Last refill:  04/04/23  Future visit scheduled: Yes  Notes to clinic:  Unable to refill due to no refill protocol for this medication.      Requested Prescriptions  Pending Prescriptions Disp Refills   Suvorexant (BELSOMRA) 10 MG TABS 30 tablet 0    Sig: Take 1 tablet (10 mg total) by mouth at bedtime.     Off-Protocol Failed - 07/13/2023  1:20 PM      Failed - Medication not assigned to a protocol, review manually.      Failed - Valid encounter within last 12 months    Recent Outpatient Visits           2 weeks ago Prediabetes   [place] [facility] [place], Salvadore [place], [place]

## 2023-08-02 ENCOUNTER — Other Ambulatory Visit (HOSPITAL_COMMUNITY): Payer: Self-pay

## 2023-08-02 DIAGNOSIS — Z79899 Other long term (current) drug therapy: Secondary | ICD-10-CM | POA: Diagnosis not present

## 2023-08-02 DIAGNOSIS — D72819 Decreased white blood cell count, unspecified: Secondary | ICD-10-CM | POA: Diagnosis not present

## 2023-08-02 DIAGNOSIS — R5383 Other fatigue: Secondary | ICD-10-CM | POA: Diagnosis not present

## 2023-08-02 DIAGNOSIS — M329 Systemic lupus erythematosus, unspecified: Secondary | ICD-10-CM | POA: Diagnosis not present

## 2023-08-02 MED ORDER — HYDROXYCHLOROQUINE SULFATE 200 MG PO TABS
200.0000 mg | ORAL_TABLET | Freq: Every day | ORAL | 1 refills | Status: DC
  Filled 2023-08-02 – 2023-10-09 (×2): qty 90, 90d supply, fill #0
  Filled 2023-12-25 – 2024-01-11 (×2): qty 90, 90d supply, fill #1

## 2023-08-04 ENCOUNTER — Other Ambulatory Visit (HOSPITAL_COMMUNITY): Payer: Self-pay

## 2023-08-12 ENCOUNTER — Other Ambulatory Visit: Payer: Self-pay | Admitting: Internal Medicine

## 2023-08-14 ENCOUNTER — Other Ambulatory Visit (HOSPITAL_COMMUNITY): Payer: Self-pay

## 2023-08-15 ENCOUNTER — Other Ambulatory Visit: Payer: Self-pay

## 2023-08-15 ENCOUNTER — Other Ambulatory Visit (HOSPITAL_COMMUNITY): Payer: Self-pay

## 2023-08-15 MED ORDER — BELSOMRA 10 MG PO TABS
1.0000 | ORAL_TABLET | Freq: Every day | ORAL | 0 refills | Status: DC
Start: 2023-08-15 — End: 2023-10-09
  Filled 2023-08-15: qty 30, 30d supply, fill #0

## 2023-08-15 NOTE — Telephone Encounter (Signed)
 Requested medication (s) are due for refill today: Yes  Requested medication (s) are on the active medication list: Yes  Last refill:  07/13/23  Future visit scheduled: Yes  Notes to clinic:  Manual review.    Requested Prescriptions  Pending Prescriptions Disp Refills   Suvorexant  (BELSOMRA ) 10 MG TABS 30 tablet 0    Sig: Take 1 tablet (10 mg total) by mouth at bedtime.     Off-Protocol Failed - 08/15/2023  8:30 AM      Failed - Medication not assigned to a protocol, review manually.      Failed - Valid encounter within last 12 months    Recent Outpatient Visits           1 month ago Prediabetes   [place] [facility] [place], [place], [place]

## 2023-08-25 ENCOUNTER — Other Ambulatory Visit (HOSPITAL_COMMUNITY): Payer: Self-pay

## 2023-08-25 ENCOUNTER — Other Ambulatory Visit: Payer: Self-pay | Admitting: Internal Medicine

## 2023-08-28 ENCOUNTER — Other Ambulatory Visit (HOSPITAL_COMMUNITY): Payer: Self-pay

## 2023-08-28 NOTE — Telephone Encounter (Signed)
 Requested medication (s) are due for refill today: yes  Requested medication (s) are on the active medication list: yes  Last refill:  04/28/23  Future visit scheduled: no  Notes to clinic:   Medication not assigned to a protocol, review manually.      Requested Prescriptions  Pending Prescriptions Disp Refills   ciprofloxacin  (CIPRO ) 250 MG tablet 30 tablet 0    Sig: Take 1 tablet (250 mg total) by mouth once after intercourse     Off-Protocol Failed - 08/28/2023 11:56 AM      Failed - Medication not assigned to a protocol, review manually.      Failed - Valid encounter within last 12 months    Recent Outpatient Visits           2 months ago Prediabetes   [place] [place], [place], [place]

## 2023-08-30 ENCOUNTER — Other Ambulatory Visit: Payer: Self-pay

## 2023-08-30 ENCOUNTER — Other Ambulatory Visit: Payer: Self-pay | Admitting: Internal Medicine

## 2023-08-31 NOTE — Telephone Encounter (Signed)
 Requested medication (s) are due for refill today: yes  Requested medication (s) are on the active medication list: yes  Last refill:  02/12/21  Future visit scheduled: no  Notes to clinic:  routing for review, last refill 01/2021 for short supply. Should patient continue to take?     Requested Prescriptions  Pending Prescriptions Disp Refills   propranolol  (INDERAL ) 10 MG tablet 30 tablet 1    Sig: Take 1 tablet (10 mg total) by mouth daily as needed.     Cardiovascular:  Beta Blockers Failed - 08/31/2023  3:36 PM      Failed - Valid encounter within last 6 months    Recent Outpatient Visits           2 months ago Prediabetes   [place] [facility] [place], [place] W, NP              Passed - Last BP in normal range    BP Readings from Last 1 Encounters:  06/28/23 102/64         Passed - Last Heart Rate in normal range    Pulse Readings from Last 1 Encounters:  09/16/22 84

## 2023-09-01 ENCOUNTER — Other Ambulatory Visit (HOSPITAL_COMMUNITY): Payer: Self-pay

## 2023-09-01 MED ORDER — PROPRANOLOL HCL 10 MG PO TABS
10.0000 mg | ORAL_TABLET | Freq: Every day | ORAL | 1 refills | Status: AC | PRN
  Filled 2023-09-01: qty 30, 30d supply, fill #0

## 2023-09-05 DIAGNOSIS — Z79899 Other long term (current) drug therapy: Secondary | ICD-10-CM | POA: Diagnosis not present

## 2023-09-05 DIAGNOSIS — I7300 Raynaud's syndrome without gangrene: Secondary | ICD-10-CM | POA: Diagnosis not present

## 2023-09-05 DIAGNOSIS — M3500 Sicca syndrome, unspecified: Secondary | ICD-10-CM | POA: Diagnosis not present

## 2023-09-05 DIAGNOSIS — M3210 Systemic lupus erythematosus, organ or system involvement unspecified: Secondary | ICD-10-CM | POA: Diagnosis not present

## 2023-10-09 ENCOUNTER — Other Ambulatory Visit: Payer: Self-pay | Admitting: Internal Medicine

## 2023-10-10 ENCOUNTER — Other Ambulatory Visit (HOSPITAL_COMMUNITY): Payer: Self-pay

## 2023-10-10 ENCOUNTER — Other Ambulatory Visit: Payer: Self-pay

## 2023-10-11 ENCOUNTER — Telehealth: Payer: Self-pay | Admitting: Internal Medicine

## 2023-10-11 NOTE — Telephone Encounter (Signed)
 Duplicate, see refill request encounter that is open dated 10/09/23.

## 2023-10-11 NOTE — Telephone Encounter (Signed)
 Copied from CRM [id number]. Topic: Clinical - Medication Refill >> Oct 11, 2023  3:49 PM Cheryl Williams wrote: Medication: Suvorexant  (BELSOMRA ) 10 MG TABS  Has the patient contacted their pharmacy? Yes (Agent: If no, request that the patient contact the pharmacy for the refill. If patient does not wish to contact the pharmacy document the reason why and proceed with request.) (Agent: If yes, when and what did the pharmacy advise?) Pharmacy needs order to refill  This is the patient's preferred pharmacy:  [facility] [address] [place] [address] Phone: [phone] Fax: [phone]  Is this the correct pharmacy for this prescription? Yes If no, delete pharmacy and type the correct one.   Has the prescription been filled recently? No  Is the patient out of the medication? No  Has the patient been seen for an appointment in the last year OR does the patient have an upcoming appointment? Yes  Can we respond through MyChart? Yes  Agent: Please be advised that Rx refills may take up to 3 business days. We ask that you follow-up with your pharmacy.

## 2023-10-11 NOTE — Telephone Encounter (Signed)
 Requested medication (s) are due for refill today: yes  Requested medication (s) are on the active medication list: yes  Last refill:  08/15/23 #30/0  Future visit scheduled: yes  Notes to clinic:  Unable to refill per protocol, medication not assigned to the refill protocol.      Requested Prescriptions  Pending Prescriptions Disp Refills   Suvorexant  (BELSOMRA ) 10 MG TABS 30 tablet 0    Sig: Take 1 tablet (10 mg total) by mouth at bedtime.     Off-Protocol Failed - 10/11/2023 12:41 PM      Failed - Medication not assigned to a protocol, review manually.      Failed - Valid encounter within last 12 months    Recent Outpatient Visits           3 months ago Prediabetes   [place] [facility] [place], Angeline [place], [place]

## 2023-10-12 ENCOUNTER — Other Ambulatory Visit (HOSPITAL_COMMUNITY): Payer: Self-pay

## 2023-10-12 MED ORDER — BELSOMRA 10 MG PO TABS
1.0000 | ORAL_TABLET | Freq: Every day | ORAL | 0 refills | Status: DC
Start: 2023-10-12 — End: 2023-11-09
  Filled 2023-10-12: qty 30, 30d supply, fill #0

## 2023-10-19 ENCOUNTER — Other Ambulatory Visit: Payer: Self-pay | Admitting: Internal Medicine

## 2023-10-24 ENCOUNTER — Other Ambulatory Visit (HOSPITAL_COMMUNITY): Payer: Self-pay

## 2023-10-24 ENCOUNTER — Encounter (HOSPITAL_COMMUNITY): Payer: Self-pay

## 2023-10-24 NOTE — Telephone Encounter (Signed)
 Too soon for refill, refilled 06/28/23 for 84 and 1 RF.  Requested Prescriptions  Pending Prescriptions Disp Refills   Norgestimate -Ethinyl Estradiol  Triphasic (TRI-VYLIBRA ) 0.18/0.215/0.25 MG-35 MCG tablet 84 tablet 1    Sig: Take 1 tablet by mouth daily.     OB/GYN:  Contraceptives Passed - 10/24/2023 10:39 AM      Passed - Last BP in normal range    BP Readings from Last 1 Encounters:  06/28/23 102/64         Passed - Valid encounter within last 12 months    Recent Outpatient Visits           3 months ago Prediabetes   [place] [facility] [place], Angeline [place], [place]              Passed - Patient is not a smoker

## 2023-11-07 ENCOUNTER — Other Ambulatory Visit: Payer: Self-pay | Admitting: Internal Medicine

## 2023-11-07 ENCOUNTER — Other Ambulatory Visit: Payer: Self-pay

## 2023-11-09 ENCOUNTER — Ambulatory Visit: Admitting: Internal Medicine

## 2023-11-09 ENCOUNTER — Encounter: Payer: Self-pay | Admitting: Internal Medicine

## 2023-11-09 ENCOUNTER — Other Ambulatory Visit (HOSPITAL_COMMUNITY): Payer: Self-pay

## 2023-11-09 ENCOUNTER — Other Ambulatory Visit: Payer: Self-pay

## 2023-11-09 MED ORDER — PREMPRO 0.45-1.5 MG PO TABS
1.0000 | ORAL_TABLET | Freq: Every day | ORAL | 0 refills | Status: DC
Start: 2023-11-09 — End: 2023-11-30
  Filled 2023-11-09: qty 28, 28d supply, fill #0

## 2023-11-09 MED ORDER — BELSOMRA 10 MG PO TABS
1.0000 | ORAL_TABLET | Freq: Every day | ORAL | 0 refills | Status: DC
  Filled 2023-11-09: qty 30, 30d supply, fill #0

## 2023-11-09 NOTE — Telephone Encounter (Signed)
 Requested medication (s) are due for refill today - yes  Requested medication (s) are on the active medication list -yes  Future visit scheduled -yes  Last refill: 04/28/23 #30  Notes to clinic: off protocol- provider review   Requested Prescriptions  Pending Prescriptions Disp Refills   ciprofloxacin  (CIPRO ) 250 MG tablet 30 tablet 0    Sig: Take 1 tablet (250 mg total) by mouth once after intercourse     Off-Protocol Failed - 11/09/2023 10:39 AM      Failed - Medication not assigned to a protocol, review manually.      Passed - Valid encounter within last 12 months    Recent Outpatient Visits           4 months ago Prediabetes   [facility] [place], Angeline ORN, NP                 Requested Prescriptions  Pending Prescriptions Disp Refills   ciprofloxacin  (CIPRO ) 250 MG tablet 30 tablet 0    Sig: Take 1 tablet (250 mg total) by mouth once after intercourse     Off-Protocol Failed - 11/09/2023 10:39 AM      Failed - Medication not assigned to a protocol, review manually.      Passed - Valid encounter within last 12 months    Recent Outpatient Visits           4 months ago Prediabetes   [facility] [place], Angeline ORN, [place]

## 2023-11-09 NOTE — Telephone Encounter (Signed)
 Copied from CRM [id number]. Topic: Appointments - Appointment Cancel/Reschedule >> Nov 09, 2023  8:18 AM [name] wrote: Patient/patient representative is calling to cancel or reschedule an appointment. Refer to attachments for appointment information.   patient returning missed call  Read [name] NP verbatim  [name] ORN, NP to Cheryl Williams     11/09/23  7:58 AM Hi [name], I see that you are on the schedule today to discuss your Tri-Sprintec.  Are you just needing this refilled?  I saw that the [facility] RN denied this refill on 7/3 as they said it was too early for refill.  If that is all you need, I will go ahead and refill it and you can keep your scheduled appointment in September.  Just let me know and we can cancel this appointment.   Patient has decided to cancel appt and communicate questions through MyChart

## 2023-11-09 NOTE — Addendum Note (Signed)
 Addended by: ANTONETTE ANGELINE ORN on: 11/09/2023 09:02 AM   Modules accepted: Orders

## 2023-11-13 ENCOUNTER — Other Ambulatory Visit (HOSPITAL_COMMUNITY): Payer: Self-pay

## 2023-11-14 ENCOUNTER — Encounter: Payer: Self-pay | Admitting: Internal Medicine

## 2023-11-14 ENCOUNTER — Telehealth: Admitting: Internal Medicine

## 2023-11-14 DIAGNOSIS — N950 Postmenopausal bleeding: Secondary | ICD-10-CM

## 2023-11-14 DIAGNOSIS — N951 Menopausal and female climacteric states: Secondary | ICD-10-CM | POA: Diagnosis not present

## 2023-11-14 NOTE — Progress Notes (Unsigned)
 Virtual Visit via Video Note  I connected with Cheryl Williams on 11/14/23 at  3:00 PM EDT by a video enabled telemedicine application and verified that I am speaking with the correct person using two identifiers.  Location: Patient: Home Provider: Office  Person's participating in this video call: [name], NP-C and Jaquelin Meaney   I discussed the limitations of evaluation and management by telemedicine and the availability of in person appointments. The patient expressed understanding and agreed to proceed.  History of Present Illness:  Discussed the use of AI scribe software for clinical note transcription with the patient, who gave verbal consent to proceed.   Cheryl Williams is a 58 year old female who presents with postmenopausal bleeding.  She was started on Trisprintec approximately six months ago for hot flashes after insurance issues prevented her from starting Veozah . Initially, she experienced confusion with the medication regimen, leading to a missed pack and subsequent menstrual-like bleeding for four to five days. After resuming the correct regimen, she was stable until mid-May to early June when she began experiencing spotting, which progressed to heavier bleeding requiring a thicker pad, though not frequent changes after switching to Prempro .  She experiences mild menstrual-like cramping, primarily in the pelvis and lower abdomen, with occasional discomfort in the back. No significant weight loss, but there was a temporary decrease in appetite when starting the medication, which has since resolved. Her weight has increased from 145-147 pounds to 150 pounds as she has been making an effort to eat more.  Her last natural menstrual period was in March 2021. There is a possible family history of gynecological cancers on her father's side, though details are unclear.        Past Medical History:  Diagnosis Date   Anemia    Anxiety    claustrphobia    Chicken pox    GERD (gastroesophageal reflux disease)    IBS (irritable bowel syndrome)    Lupus    Raynaud disease    Sjogrens syndrome (HCC)     Current Outpatient Medications  Medication Sig Dispense Refill   ciclopirox  (PENLAC ) 8 % solution Apply topically at bedtime. Apply over nail and surrounding skin. Apply daily over previous coat. After seven (7) days, may remove with alcohol and continue cycle. 6.6 mL 11   ciprofloxacin  (CIPRO ) 250 MG tablet Take 1 tablet (250 mg total) by mouth once after intercourse 30 tablet 0   estrogen, conjugated,-medroxyprogesterone (PREMPRO ) 0.45-1.5 MG tablet Take 1 tablet by mouth daily. 90 tablet 0   hydroxychloroquine  (PLAQUENIL ) 200 MG tablet Take 1 tablet (200 mg total) by mouth daily with food or milk. (Patient not taking: Reported on 06/28/2023) 90 tablet 0   hydroxychloroquine  (PLAQUENIL ) 200 MG tablet Take 1 tablet (200 mg total) by mouth daily with food or milk. 90 tablet 1   omeprazole  (PRILOSEC) 20 MG capsule Take 1 capsule (20 mg total) by mouth daily. 90 capsule 1   propranolol  (INDERAL ) 10 MG tablet Take 1 tablet (10 mg total) by mouth daily as needed. 30 tablet 1   simvastatin  (ZOCOR ) 10 MG tablet Take 1 tablet (10 mg total) by mouth at bedtime. (Patient not taking: Reported on 06/28/2023) 90 tablet 1   Suvorexant  (BELSOMRA ) 10 MG TABS Take 1 tablet (10 mg total) by mouth at bedtime. 30 tablet 0   tretinoin  (RETIN-A ) 0.025 % cream Apply 1 application topically to face nightly. 45 g 2   valACYclovir  (VALTREX ) 1000 MG tablet Take 1/2 tablet (500  mg total) by mouth twice daily for 3 days for recurrent outbreaks and 1 tablet daily for suppressive therapy. 90 tablet 1   No current facility-administered medications for this visit.    Allergies  Allergen Reactions   Bactrim  [Sulfamethoxazole -Trimethoprim ] Hives   Trimethoprim  Hives   Imuran [Azathioprine] Other (See Comments)    Mental hallucinations   Penicillins      Rash and hives at age  53    Family History  Problem Relation Age of Onset   Hyperlipidemia Mother    Hypertension Mother    Deafness Mother    Colon polyps Mother    Alcohol abuse Father    Deafness Father    Drug abuse Sister    Hypertension Sister    Drug abuse Brother    Rheum arthritis Maternal Aunt    Lupus Maternal Aunt    Rheum arthritis Maternal Grandmother    Hypertension Sister    Colon cancer Neg Hx    Esophageal cancer Neg Hx    Rectal cancer Neg Hx    Stomach cancer Neg Hx     Social History   Socioeconomic History   Marital status: Divorced    Spouse name: Not on file   Number of children: Not on file   Years of education: Not on file   Highest education level: Not on file  Occupational History   Not on file  Tobacco Use   Smoking status: Never   Smokeless tobacco: Never  Substance and Sexual Activity   Alcohol use: Yes    Alcohol/week: 0.0 standard drinks of alcohol    Comment: occasional--moderate   Drug use: No   Sexual activity: Yes  Other Topics Concern   Not on file  Social History Narrative   Not on file   Social Drivers of Health   Financial Resource Strain: Not on file  Food Insecurity: Not on file  Transportation Needs: Not on file  Physical Activity: Not on file  Stress: Not on file  Social Connections: Not on file  Intimate Partner Violence: Not on file     Constitutional: Denies fever, malaise, fatigue, headache or abrupt weight changes.  HEENT: Denies eye pain, eye redness, ear pain, ringing in the ears, wax buildup, runny nose, nasal congestion, bloody nose, or sore throat. Respiratory: Denies difficulty breathing, shortness of breath, cough or sputum production.   Cardiovascular: Denies chest pain, chest tightness, palpitations or swelling in the hands or feet.  Gastrointestinal: Pt reports menstrual cramping. Denies bloating, constipation, diarrhea or blood in the stool.  GU: Pt reports vaginal bleeding. Denies urgency, frequency, pain with  urination, burning sensation, blood in urine, odor or discharge. Musculoskeletal: Denies decrease in range of motion, difficulty with gait, muscle pain or joint pain and swelling.  Skin: Denies redness, rashes, lesions or ulcercations.  Neurological: Pt reports hot flashes. Denies dizziness, difficulty with memory, difficulty with speech or problems with balance and coordination.  Psych: Denies anxiety, depression, SI/HI.  No other specific complaints in a complete review of systems (except as listed in HPI above).  Observations/Objective:   Wt Readings from Last 3 Encounters:  06/28/23 151 lb 3.2 oz (68.6 kg)  09/16/22 159 lb (72.1 kg)  08/30/22 165 lb 3.2 oz (74.9 kg)    General: Appears her stated age, well developed, well nourished in NAD. Pulmonary/Chest: Normal effort. No respiratory distress.  Neurological: Alert and oriented.   BMET    Component Value Date/Time   NA 139 06/28/2023 1110   K  4.0 06/28/2023 1110   CL 102 06/28/2023 1110   CO2 30 06/28/2023 1110   GLUCOSE 86 06/28/2023 1110   BUN 18 06/28/2023 1110   CREATININE 0.86 06/28/2023 1110   CALCIUM 10.1 06/28/2023 1110    Lipid Panel     Component Value Date/Time   CHOL 205 (H) 06/28/2023 1110   TRIG 63 06/28/2023 1110   HDL 76 06/28/2023 1110   CHOLHDL 2.7 06/28/2023 1110   VLDL 14.4 05/26/2020 0955   LDLCALC 114 (H) 06/28/2023 1110    CBC    Component Value Date/Time   WBC 3.1 (L) 06/28/2023 1110   RBC 3.99 06/28/2023 1110   HGB 11.8 06/28/2023 1110   HCT 35.7 06/28/2023 1110   PLT 230 06/28/2023 1110   MCV 89.5 06/28/2023 1110   MCH 29.6 06/28/2023 1110   MCHC 33.1 06/28/2023 1110   RDW 13.0 06/28/2023 1110    Hgb A1C Lab Results  Component Value Date   HGBA1C 6.1 (H) 06/28/2023       Assessment and Plan:  Assessment and Plan    Postmenopausal bleeding Bleeding post-Trisprintec discontinuation and Prempro  initiation. Heavier than spotting, mild cramping. - Discontinue  Prempro . - Order pelvic ultrasound at [facility] [place]. - Refer to gynecology in [facility] for further evaluation and treatment.  Hot flashes Hot flashes managed with Prempro  after Trisprintec. Veozah  not used due to insurance. - Discontinue Prempro  for hot flash management at this time .       Follow Up Instructions:    I discussed the assessment and treatment plan with the patient. The patient was provided an opportunity to ask questions and all were answered. The patient agreed with the plan and demonstrated an understanding of the instructions.   The patient was advised to call back or seek an in-person evaluation if the symptoms worsen or if the condition fails to improve as anticipated.   [name], NP

## 2023-11-14 NOTE — Telephone Encounter (Signed)
 Can we set her up for a virtual visit for tomorrow?

## 2023-11-15 ENCOUNTER — Encounter: Payer: Self-pay | Admitting: Internal Medicine

## 2023-11-15 NOTE — Patient Instructions (Signed)
 Abnormal Uterine Bleeding    Abnormal uterine bleeding means bleeding more than normal from your womb (uterus). It can include:  Bleeding after sex.  Bleeding between monthly (menstrual) periods.  Bleeding that is heavier than normal.  Monthly periods that last longer than normal.  Bleeding after you have stopped having your monthly period (menopause).  You should see a doctor for any kind of bleeding that is not normal. Treatment depends on the cause of your bleeding and how much you bleed.  Follow these instructions at home:  Medicines  Take over-the-counter and prescription medicines only as told by your doctor.  Ask your doctor about:  Taking medicines such as aspirin and ibuprofen. Do not take these medicines unless your doctor tells you to take them.  Taking over-the-counter medicines, vitamins, herbs, and supplements.  You may be given iron pills. Take them as told by your doctor.  Managing constipation  If you take iron pills, you may need to take these actions to prevent or treat trouble pooping (constipation):  Drink enough fluid to keep your pee (urine) pale yellow.  Take over-the-counter or prescription medicines.  Eat foods that are high in fiber. These include beans, whole grains, and fresh fruits and vegetables.  Limit foods that are high in fat and sugar. These include fried or sweet foods.  Activity  Change your activity to decrease bleeding if you need to change your sanitary pad more than one time every 2 hours:  Lie in bed with your feet raised (elevated).  Place a cold pack on your lower belly.  Rest as much as you are able until the bleeding stops or slows down.  General instructions  Do not use tampons, douche, or have sex until your doctor says these things are okay.  Change your pads often.  Get regular exams. These include:  Pelvic exams.  Screenings for cancer of the cervix.  It is up to you to get the results of any tests that are done. Ask how to get your results when they are  ready.  Watch for any changes in your bleeding. For 2 months, write down:  When your monthly period starts.  When your monthly period ends.  When you get any abnormal bleeding from your vagina.  What problems you notice.  Keep all follow-up visits.  Contact a doctor if:  The bleeding lasts more than one week.  You feel dizzy at times.  You feel like you may vomit (nausea).  You vomit.  You feel light-headed or weak.  Your symptoms get worse.  Get help right away if:  You faint.  You have to change pads every hour.  You have pain in your belly.  You have a fever or chills.  You get sweaty or weak.  You pass large blood clots from your vagina.  These symptoms may be an emergency. Get help right away. Call your local emergency services (911 in the [place]  Do not wait to see if the symptoms will go away.  Do not drive yourself to the hospital.  Summary  Abnormal uterine bleeding means bleeding more than normal from your womb (uterus).  Any kind of bleeding that is not normal should be checked by a doctor.  Treatment depends on the cause of your bleeding and how much you bleed.  Get help right away if you faint, you have to change pads every hour, or you pass large blood clots from your vagina.  This information is not intended to replace  advice given to you by your health care provider. Make sure you discuss any questions you have with your health care provider.  Document Revised: 08/04/2020 Document Reviewed: 08/04/2020  Elsevier Patient Education  2024 [employer].

## 2023-11-21 ENCOUNTER — Ambulatory Visit
Admission: RE | Admit: 2023-11-21 | Discharge: 2023-11-21 | Disposition: A | Discharge status: Home / Self Care | Source: Ambulatory Visit | Attending: Internal Medicine | Admitting: Internal Medicine

## 2023-11-21 DIAGNOSIS — D251 Intramural leiomyoma of uterus: Secondary | ICD-10-CM | POA: Diagnosis not present

## 2023-11-21 DIAGNOSIS — N951 Menopausal and female climacteric states: Secondary | ICD-10-CM | POA: Diagnosis not present

## 2023-11-21 DIAGNOSIS — N939 Abnormal uterine and vaginal bleeding, unspecified: Secondary | ICD-10-CM | POA: Diagnosis not present

## 2023-11-21 DIAGNOSIS — N858 Other specified noninflammatory disorders of uterus: Secondary | ICD-10-CM | POA: Diagnosis not present

## 2023-11-21 DIAGNOSIS — N950 Postmenopausal bleeding: Secondary | ICD-10-CM | POA: Insufficient documentation

## 2023-11-30 ENCOUNTER — Ambulatory Visit (INDEPENDENT_AMBULATORY_CARE_PROVIDER_SITE_OTHER): Admitting: Internal Medicine

## 2023-11-30 ENCOUNTER — Other Ambulatory Visit (HOSPITAL_COMMUNITY): Payer: Self-pay

## 2023-11-30 ENCOUNTER — Encounter: Payer: Self-pay | Admitting: Internal Medicine

## 2023-11-30 VITALS — BP 102/68 | HR 95 | Ht 67.0 in | Wt 150.4 lb

## 2023-11-30 DIAGNOSIS — M3219 Other organ or system involvement in systemic lupus erythematosus: Secondary | ICD-10-CM

## 2023-11-30 DIAGNOSIS — Z0001 Encounter for general adult medical examination with abnormal findings: Secondary | ICD-10-CM | POA: Diagnosis not present

## 2023-11-30 DIAGNOSIS — E7800 Pure hypercholesterolemia, unspecified: Secondary | ICD-10-CM

## 2023-11-30 DIAGNOSIS — R7303 Prediabetes: Secondary | ICD-10-CM

## 2023-11-30 DIAGNOSIS — Z1231 Encounter for screening mammogram for malignant neoplasm of breast: Secondary | ICD-10-CM

## 2023-11-30 MED ORDER — PREDNISONE 10 MG PO TABS
ORAL_TABLET | ORAL | 0 refills | Status: AC
Start: 2023-11-30 — End: 2023-12-06
  Filled 2023-11-30: qty 21, 6d supply, fill #0

## 2023-11-30 NOTE — Patient Instructions (Signed)
 Health Maintenance for Postmenopausal Women Menopause is a normal process in which your ability to get pregnant comes to an end. This process happens slowly over many months or years, usually between the ages of 76 and 38. Menopause is complete when you have missed your menstrual period for 12 months. It is important to talk with your health care provider about some of the most common conditions that affect women after menopause (postmenopausal women). These include heart disease, cancer, and bone loss (osteoporosis). Adopting a healthy lifestyle and getting preventive care can help to promote your health and wellness. The actions you take can also lower your chances of developing some of these common conditions. What are the signs and symptoms of menopause? During menopause, you may have the following symptoms: Hot flashes. These can be moderate or severe. Night sweats. Decrease in sex drive. Mood swings. Headaches. Tiredness (fatigue). Irritability. Memory problems. Problems falling asleep or staying asleep. Talk with your health care provider about treatment options for your symptoms. Do I need hormone replacement therapy? Hormone replacement therapy is effective in treating symptoms that are caused by menopause, such as hot flashes and night sweats. Hormone replacement carries certain risks, especially as you become older. If you are thinking about using estrogen or estrogen with progestin, discuss the benefits and risks with your health care provider. How can I reduce my risk for heart disease and stroke? The risk of heart disease, heart attack, and stroke increases as you age. One of the causes may be a change in the body's hormones during menopause. This can affect how your body uses dietary fats, triglycerides, and cholesterol. Heart attack and stroke are medical emergencies. There are many things that you can do to help prevent heart disease and stroke. Watch your blood pressure High  blood pressure causes heart disease and increases the risk of stroke. This is more likely to develop in people who have high blood pressure readings or are overweight. Have your blood pressure checked: Every 3-5 years if you are 32-23 years of age. Every year if you are 31 years old or older. Eat a healthy diet  Eat a diet that includes plenty of vegetables, fruits, low-fat dairy products, and lean protein. Do not eat a lot of foods that are high in solid fats, added sugars, or sodium. Get regular exercise Get regular exercise. This is one of the most important things you can do for your health. Most adults should: Try to exercise for at least 150 minutes each week. The exercise should increase your heart rate and make you sweat (moderate-intensity exercise). Try to do strengthening exercises at least twice each week. Do these in addition to the moderate-intensity exercise. Spend less time sitting. Even light physical activity can be beneficial. Other tips Work with your health care provider to achieve or maintain a healthy weight. Do not use any products that contain nicotine or tobacco. These products include cigarettes, chewing tobacco, and vaping devices, such as e-cigarettes. If you need help quitting, ask your health care provider. Know your numbers. Ask your health care provider to check your cholesterol and your blood sugar (glucose). Continue to have your blood tested as directed by your health care provider. Do I need screening for cancer? Depending on your health history and family history, you may need to have cancer screenings at different stages of your life. This may include screening for: Breast cancer. Cervical cancer. Lung cancer. Colorectal cancer. What is my risk for osteoporosis? After menopause, you may be  at increased risk for osteoporosis. Osteoporosis is a condition in which bone destruction happens more quickly than new bone creation. To help prevent osteoporosis or  the bone fractures that can happen because of osteoporosis, you may take the following actions: If you are 24-54 years old, get at least 1,000 mg of calcium and at least 600 international units (IU) of vitamin D  per day. If you are older than age 75 but younger than age 30, get at least 1,200 mg of calcium and at least 600 international units (IU) of vitamin D  per day. If you are older than age 8, get at least 1,200 mg of calcium and at least 800 international units (IU) of vitamin D  per day. Smoking and drinking excessive alcohol increase the risk of osteoporosis. Eat foods that are rich in calcium and vitamin D , and do weight-bearing exercises several times each week as directed by your health care provider. How does menopause affect my mental health? Depression may occur at any age, but it is more common as you become older. Common symptoms of depression include: Feeling depressed. Changes in sleep patterns. Changes in appetite or eating patterns. Feeling an overall lack of motivation or enjoyment of activities that you previously enjoyed. Frequent crying spells. Talk with your health care provider if you think that you are experiencing any of these symptoms. General instructions See your health care provider for regular wellness exams and vaccines. This may include: Scheduling regular health, dental, and eye exams. Getting and maintaining your vaccines. These include: Influenza vaccine. Get this vaccine each year before the flu season begins. Pneumonia vaccine. Shingles vaccine. Tetanus, diphtheria, and pertussis (Tdap) booster vaccine. Your health care provider may also recommend other immunizations. Tell your health care provider if you have ever been abused or do not feel safe at home. Summary Menopause is a normal process in which your ability to get pregnant comes to an end. This condition causes hot flashes, night sweats, decreased interest in sex, mood swings, headaches, or lack  of sleep. Treatment for this condition may include hormone replacement therapy. Take actions to keep yourself healthy, including exercising regularly, eating a healthy diet, watching your weight, and checking your blood pressure and blood sugar levels. Get screened for cancer and depression. Make sure that you are up to date with all your vaccines. This information is not intended to replace advice given to you by your health care provider. Make sure you discuss any questions you have with your health care provider. Document Revised: 08/24/2020 Document Reviewed: 08/24/2020 Elsevier Patient Education  2024 [employer].

## 2023-11-30 NOTE — Progress Notes (Signed)
 Subjective:    Patient ID: Cheryl Williams, female    DOB: Sep 12, 1965, 58 y.o.   MRN: [id number]  HPI  Patient presents to clinic today for annual exam.  She also reports a rash of her bilateral upper extremities.  She noticed this 2 weeks ago but the rash seems to be worsening.  She denies any initial itching, burning or drainage but reports the lesions that are now itchy and sore.  She does have a history of lupus but has never had a flare.  She has tried triamcinolone  cream OTC with minimal relief of symptoms.  Flu: 01/2023 Tetanus: 10/2016 COVID: Pfizer x 6 Shingrix: Never Pap smear: 01/2022 Mammogram: 01/2023 Colon screening: 04/2018 Vision screening: annually Dentist: biannually  Diet: She does eat meat. She consumes fruits and veggies. She does eat some fried foods. She drinks mostly water. Exercise: Ride a bike, weights  Review of Systems     Past Medical History:  Diagnosis Date   Anemia    Anxiety    claustrphobia   Chicken pox    GERD (gastroesophageal reflux disease)    IBS (irritable bowel syndrome)    Lupus    Raynaud disease    Sjogrens syndrome (HCC)     Current Outpatient Medications  Medication Sig Dispense Refill   ciclopirox  (PENLAC ) 8 % solution Apply topically at bedtime. Apply over nail and surrounding skin. Apply daily over previous coat. After seven (7) days, may remove with alcohol and continue cycle. 6.6 mL 11   ciprofloxacin  (CIPRO ) 250 MG tablet Take 1 tablet (250 mg total) by mouth once after intercourse 30 tablet 0   estrogen, conjugated,-medroxyprogesterone (PREMPRO ) 0.45-1.5 MG tablet Take 1 tablet by mouth daily. 90 tablet 0   hydroxychloroquine  (PLAQUENIL ) 200 MG tablet Take 1 tablet (200 mg total) by mouth daily with food or milk. 90 tablet 1   omeprazole  (PRILOSEC) 20 MG capsule Take 1 capsule (20 mg total) by mouth daily. 90 capsule 1   propranolol  (INDERAL ) 10 MG tablet Take 1 tablet (10 mg total) by mouth daily as needed. 30  tablet 1   simvastatin  (ZOCOR ) 10 MG tablet Take 1 tablet (10 mg total) by mouth at bedtime. (Patient not taking: Reported on 06/28/2023) 90 tablet 1   Suvorexant  (BELSOMRA ) 10 MG TABS Take 1 tablet (10 mg total) by mouth at bedtime. 30 tablet 0   tretinoin  (RETIN-A ) 0.025 % cream Apply 1 application topically to face nightly. 45 g 2   valACYclovir  (VALTREX ) 1000 MG tablet Take 1/2 tablet (500 mg total) by mouth twice daily for 3 days for recurrent outbreaks and 1 tablet daily for suppressive therapy. 90 tablet 1   No current facility-administered medications for this visit.    Allergies  Allergen Reactions   Bactrim  [Sulfamethoxazole -Trimethoprim ] Hives   Trimethoprim  Hives   Imuran [Azathioprine] Other (See Comments)    Mental hallucinations   Penicillins      Rash and hives at age 32    Family History  Problem Relation Age of Onset   Hyperlipidemia Mother    Hypertension Mother    Deafness Mother    Colon polyps Mother    Alcohol abuse Father    Deafness Father    Drug abuse Sister    Hypertension Sister    Drug abuse Brother    Rheum arthritis Maternal Aunt    Lupus Maternal Aunt    Rheum arthritis Maternal Grandmother    Hypertension Sister    Colon cancer Neg Hx  Esophageal cancer Neg Hx    Rectal cancer Neg Hx    Stomach cancer Neg Hx     Social History   Socioeconomic History   Marital status: Divorced    Spouse name: Not on file   Number of children: Not on file   Years of education: Not on file   Highest education level: Not on file  Occupational History   Not on file  Tobacco Use   Smoking status: Never   Smokeless tobacco: Never  Substance and Sexual Activity   Alcohol use: Yes    Alcohol/week: 0.0 standard drinks of alcohol    Comment: occasional--moderate   Drug use: No   Sexual activity: Yes  Other Topics Concern   Not on file  Social History Narrative   Not on file   Social Drivers of Health   Financial Resource Strain: Not on file   Food Insecurity: Not on file  Transportation Needs: Not on file  Physical Activity: Not on file  Stress: Not on file  Social Connections: Not on file  Intimate Partner Violence: Not on file     Constitutional: Denies fever, malaise, fatigue, headache or abrupt weight changes.  HEENT: Denies eye pain, eye redness, ear pain, ringing in the ears, wax buildup, runny nose, nasal congestion, bloody nose, or sore throat. Respiratory: Denies difficulty breathing, shortness of breath, cough or sputum production.   Cardiovascular: Denies chest pain, chest tightness, palpitations or swelling in the hands or feet.  Gastrointestinal: Pt reports intermittent reflux. Denies abdominal pain, bloating, constipation, diarrhea or blood in the stool.  GU: Denies urgency, frequency, pain with urination, burning sensation, blood in urine, odor or discharge. Musculoskeletal: Denies decrease in range of motion, difficulty with gait, muscle pain or joint pain and swelling.  Skin: Pt reports rash of bilateral upper arms. Denies ulcercations.  Neurological: Patient reports hot flashes, insomnia.  Denies dizziness, difficulty with memory, difficulty with speech or problems with balance and coordination.  Psych: Patient reports anxiety.  Denies depression, SI/HI.  No other specific complaints in a complete review of systems (except as listed in HPI above).  Objective:   Physical Exam  BP 102/68 (BP Location: Left Arm, Patient Position: Sitting, Cuff Size: Normal)   Pulse 95   Ht 5' 7 (1.702 m)   Wt 150 lb 6.4 oz (68.2 kg)   LMP 06/21/2023 (Approximate) Comment: had a period while taking birth control  SpO2 98%   BMI 23.56 kg/m    Wt Readings from Last 3 Encounters:  06/28/23 151 lb 3.2 oz (68.6 kg)  09/16/22 159 lb (72.1 kg)  08/30/22 165 lb 3.2 oz (74.9 kg)    General: Appears her stated age, well developed, well nourished in NAD. Skin: Warm, dry and intact.  Grouped, round, raised papular lesions  noted of bilateral posterior upper arms.    HEENT: Head: normal shape and size; Eyes: sclera white, no icterus, conjunctiva pink, PERRLA and EOMs intact;  Neck:  Neck supple, trachea midline. No masses, lumps or thyromegaly present.  Cardiovascular: Normal rate and rhythm. S1,S2 noted.  No murmur, rubs or gallops noted. No JVD or BLE edema. No carotid bruits noted. Pulmonary/Chest: Normal effort and positive vesicular breath sounds. No respiratory distress. No wheezes, rales or ronchi noted.  Abdomen:. Normal bowel sounds.  Musculoskeletal: Strength 5/5 BUE/BLE.  No difficulty with gait.  Neurological: Alert and oriented. Cranial nerves II-XII grossly intact. Coordination normal.  Psychiatric: Mood and affect normal. Behavior is normal. Judgment and thought content  normal.    BMET    Component Value Date/Time   NA 139 06/28/2023 1110   K 4.0 06/28/2023 1110   CL 102 06/28/2023 1110   CO2 30 06/28/2023 1110   GLUCOSE 86 06/28/2023 1110   BUN 18 06/28/2023 1110   CREATININE 0.86 06/28/2023 1110   CALCIUM 10.1 06/28/2023 1110    Lipid Panel     Component Value Date/Time   CHOL 205 (H) 06/28/2023 1110   TRIG 63 06/28/2023 1110   HDL 76 06/28/2023 1110   CHOLHDL 2.7 06/28/2023 1110   VLDL 14.4 05/26/2020 0955   LDLCALC 114 (H) 06/28/2023 1110    CBC    Component Value Date/Time   WBC 3.1 (L) 06/28/2023 1110   RBC 3.99 06/28/2023 1110   HGB 11.8 06/28/2023 1110   HCT 35.7 06/28/2023 1110   PLT 230 06/28/2023 1110   MCV 89.5 06/28/2023 1110   MCH 29.6 06/28/2023 1110   MCHC 33.1 06/28/2023 1110   RDW 13.0 06/28/2023 1110    Hgb A1C Lab Results  Component Value Date   HGBA1C 6.1 (H) 06/28/2023           Assessment & Plan:   Preventative Health Maintenance:  Encouraged her to get a flu shot in the fall Tetanus UTD per report Encouraged her to get her COVID booster Discussed Shingrix vaccine, she will check coverage with her insurance company and schedule a  visit if she would like to have this done Pap smear UTD Mammogram ordered-she will call to schedule Colon screening UTD Encouraged her to consume a balanced diet and exercise regimen Advised her to see an eye doctor and dentist annually We will check CBC, c-Met, lipid and A1c today  SLE Flare:  Rx for Pred taper x 6 days Advised her to call her rheumatologist and schedule an appointment for follow-up  RTC in 6 months, follow-up chronic conditions [name], NP

## 2023-12-01 ENCOUNTER — Encounter: Admitting: Internal Medicine

## 2023-12-01 DIAGNOSIS — Z0001 Encounter for general adult medical examination with abnormal findings: Secondary | ICD-10-CM | POA: Diagnosis not present

## 2023-12-01 DIAGNOSIS — R7303 Prediabetes: Secondary | ICD-10-CM | POA: Diagnosis not present

## 2023-12-01 DIAGNOSIS — E7800 Pure hypercholesterolemia, unspecified: Secondary | ICD-10-CM | POA: Diagnosis not present

## 2023-12-02 ENCOUNTER — Ambulatory Visit: Payer: Self-pay | Admitting: Internal Medicine

## 2023-12-02 LAB — LIPID PANEL
Chol/HDL Ratio: 3.5 ratio (ref 0.0–4.4)
Cholesterol, Total: 228 mg/dL — ABNORMAL HIGH (ref 100–199)
HDL: 66 mg/dL (ref >39)
LDL Chol Calc (NIH): 142 mg/dL — ABNORMAL HIGH (ref 0–99)
Triglycerides: 112 mg/dL (ref 0–149)
VLDL Cholesterol Cal: 20 mg/dL (ref 5–40)

## 2023-12-02 LAB — COMPREHENSIVE METABOLIC PANEL WITH GFR
ALT: 20 IU/L (ref 0–32)
AST: 40 IU/L (ref 0–40)
Albumin: 4 g/dL (ref 3.8–4.9)
Alkaline Phosphatase: 58 IU/L (ref 44–121)
BUN/Creatinine Ratio: 20 (ref 9–23)
BUN: 17 mg/dL (ref 6–24)
Bilirubin Total: 0.3 mg/dL (ref 0.0–1.2)
CO2: 23 mmol/L (ref 20–29)
Calcium: 9.1 mg/dL (ref 8.7–10.2)
Chloride: 100 mmol/L (ref 96–106)
Creatinine, Ser: 0.85 mg/dL (ref 0.57–1.00)
Globulin, Total: 3.5 g/dL (ref 1.5–4.5)
Glucose: 71 mg/dL (ref 70–99)
Potassium: 4.3 mmol/L (ref 3.5–5.2)
Sodium: 137 mmol/L (ref 134–144)
Total Protein: 7.5 g/dL (ref 6.0–8.5)
eGFR: 80 mL/min/1.73 (ref >59)

## 2023-12-02 LAB — HEMOGLOBIN A1C
Est. average glucose Bld gHb Est-mCnc: 117 mg/dL
Hgb A1c MFr Bld: 5.7 % — ABNORMAL HIGH (ref 4.8–5.6)

## 2023-12-02 LAB — CBC
Hematocrit: 36.1 % (ref 34.0–46.6)
Hemoglobin: 11.6 g/dL (ref 11.1–15.9)
MCH: 30.1 pg (ref 26.6–33.0)
MCHC: 32.1 g/dL (ref 31.5–35.7)
MCV: 94 fL (ref 79–97)
Platelets: 231 x10E3/uL (ref 150–450)
RBC: 3.86 x10E6/uL (ref 3.77–5.28)
RDW: 13 % (ref 11.7–15.4)
WBC: 3.2 x10E3/uL — ABNORMAL LOW (ref 3.4–10.8)

## 2023-12-03 ENCOUNTER — Other Ambulatory Visit: Payer: Self-pay | Admitting: Internal Medicine

## 2023-12-03 ENCOUNTER — Encounter: Payer: Self-pay | Admitting: Internal Medicine

## 2023-12-04 ENCOUNTER — Other Ambulatory Visit (HOSPITAL_COMMUNITY): Payer: Self-pay

## 2023-12-04 ENCOUNTER — Ambulatory Visit: Payer: Self-pay | Admitting: Internal Medicine

## 2023-12-05 ENCOUNTER — Other Ambulatory Visit (HOSPITAL_COMMUNITY)
Admission: RE | Admit: 2023-12-05 | Discharge: 2023-12-05 | Disposition: A | Discharge status: Home / Self Care | Source: Ambulatory Visit | Attending: Obstetrics & Gynecology | Admitting: Obstetrics & Gynecology

## 2023-12-05 ENCOUNTER — Encounter: Admitting: Internal Medicine

## 2023-12-05 ENCOUNTER — Ambulatory Visit: Admitting: Obstetrics & Gynecology

## 2023-12-05 ENCOUNTER — Encounter: Payer: Self-pay | Admitting: Obstetrics & Gynecology

## 2023-12-05 VITALS — BP 117/79 | HR 91 | Ht 67.0 in | Wt 152.8 lb

## 2023-12-05 DIAGNOSIS — N888 Other specified noninflammatory disorders of cervix uteri: Secondary | ICD-10-CM | POA: Diagnosis not present

## 2023-12-05 DIAGNOSIS — N950 Postmenopausal bleeding: Secondary | ICD-10-CM | POA: Diagnosis not present

## 2023-12-05 DIAGNOSIS — R9389 Abnormal findings on diagnostic imaging of other specified body structures: Secondary | ICD-10-CM | POA: Diagnosis not present

## 2023-12-05 DIAGNOSIS — N858 Other specified noninflammatory disorders of uterus: Secondary | ICD-10-CM | POA: Diagnosis not present

## 2023-12-05 NOTE — Progress Notes (Signed)
    GYNECOLOGY PROGRESS NOTE  Subjective:    Patient ID: Cheryl Williams, female    DOB: 1966/02/05, 58 y.o.   MRN: [id number]  HPI  Patient is a 58 y.o. engaged G2P2002 (80 and 29 yo kids) here today as a new patient with the issue of PMB. She reports that her last natural period was March 2021. She started OCPs (tri spinspec) in January and took it for 6 months for hot flashes. She was having heavy spotting with this drug so she was prescibed prempro  July 2025. Her bleeding then increased and she stopped it and was referred here and had a pelvic ultrasound. Her bleeding stopped about a week after stopping the prempro . Her scan showed her endometrial lining to be 10 mm.  The following portions of the patient's history were reviewed and updated as appropriate: allergies, current medications, past family history, past medical history, past social history, past surgical history, and problem list.  Review of Systems Pertinent items are noted in HPI.  She works for [employer] remotely. She is sexually  Objective:   Height 5' 7 (1.702 m), weight 152 lb 12.8 oz (69.3 kg), last menstrual period 06/21/2023. Body mass index is 23.93 kg/m. Well nourished, well hydrated female, no apparent distress She is ambulating and conversing normally.   Consent signed, time out done Cervix prepped with betadine and sprayed with Hurricaine spray. I then grasped with a single tooth tenaculum. Uterus sounded to 9 cm Pipelle used for 2 passes with a moderate amount of tissue obtained. She tolerated the procedure well.  Assessment:  PMB, thickened endometrium  Plan:   Await pathology

## 2023-12-05 NOTE — Telephone Encounter (Signed)
 Requested medications are due for refill today.  yes  Requested medications are on the active medications list.  yes  Last refill. 11/09/2023 #30 0 rf  Future visit scheduled.   yes  Notes to clinic.  Refill not delegated.    Requested Prescriptions  Pending Prescriptions Disp Refills   Suvorexant  (BELSOMRA ) 10 MG TABS 30 tablet 0    Sig: Take 1 tablet (10 mg total) by mouth at bedtime.     Off-Protocol Failed - 12/05/2023  5:20 PM      Failed - Medication not assigned to a protocol, review manually.      Passed - Valid encounter within last 12 months    Recent Outpatient Visits           5 days ago Encounter for general adult medical examination with abnormal findings   [place] [facility] [place], Angeline ORN, NP   3 weeks ago Postmenopausal vaginal bleeding   [place] [facility] [place], Angeline ORN, NP   5 months ago Prediabetes   [facility] [place], Angeline ORN, [place]

## 2023-12-07 ENCOUNTER — Other Ambulatory Visit (HOSPITAL_COMMUNITY): Payer: Self-pay

## 2023-12-07 LAB — SURGICAL PATHOLOGY

## 2023-12-07 MED ORDER — BELSOMRA 10 MG PO TABS
1.0000 | ORAL_TABLET | Freq: Every day | ORAL | 0 refills | Status: DC
Start: 2023-12-07 — End: 2024-01-08
  Filled 2023-12-07: qty 30, 30d supply, fill #0

## 2023-12-08 ENCOUNTER — Encounter: Payer: Self-pay | Admitting: Obstetrics & Gynecology

## 2023-12-11 ENCOUNTER — Encounter: Payer: Self-pay | Admitting: Obstetrics & Gynecology

## 2023-12-25 ENCOUNTER — Other Ambulatory Visit: Payer: Self-pay

## 2023-12-25 ENCOUNTER — Encounter: Payer: Self-pay | Admitting: Pharmacist

## 2023-12-25 ENCOUNTER — Other Ambulatory Visit: Payer: Self-pay | Admitting: Internal Medicine

## 2023-12-26 ENCOUNTER — Other Ambulatory Visit (HOSPITAL_COMMUNITY): Payer: Self-pay

## 2023-12-26 MED ORDER — SIMVASTATIN 10 MG PO TABS
10.0000 mg | ORAL_TABLET | Freq: Every day | ORAL | 1 refills | Status: AC
  Filled 2023-12-26 – 2024-01-08 (×3): qty 90, 90d supply, fill #0

## 2023-12-26 NOTE — Telephone Encounter (Signed)
 Lipid panel in date.  Requested Prescriptions  Pending Prescriptions Disp Refills   simvastatin  (ZOCOR ) 10 MG tablet 90 tablet 1    Sig: Take 1 tablet (10 mg total) by mouth at bedtime.     Cardiovascular:  Antilipid - Statins Failed - 12/26/2023 11:49 AM      Failed - Lipid Panel in normal range within the last 12 months    Cholesterol, Total  Date Value Ref Range Status  12/01/2023 228 (H) 100 - 199 mg/dL Final   LDL Cholesterol (Calc)  Date Value Ref Range Status  06/28/2023 114 (H) mg/dL (calc) Final    Comment:    Reference range: <100 . Desirable range <100 mg/dL for primary prevention;   <70 mg/dL for patients with CHD or diabetic patients  with > or = 2 CHD risk factors. SABRA LDL-C is now calculated using the Martin-Hopkins  calculation, which is a validated novel method providing  better accuracy than the Friedewald equation in the  estimation of LDL-C.  Gladis APPLETHWAITE et al. SANDREA. 7986;689(80): 2061-2068  (http://education.Quest[employer].com/faq/FAQ164)    LDL Chol Calc (NIH)  Date Value Ref Range Status  12/01/2023 142 (H) 0 - 99 mg/dL Final   Direct LDL  Date Value Ref Range Status  12/04/2014 80.0 mg/dL Final    Comment:    Optimal:  <100 mg/dLNear or Above Optimal:  100-129 mg/dLBorderline High:  130-159 mg/dLHigh:  160-189 mg/dLVery High:  >190 mg/dL   HDL  Date Value Ref Range Status  12/01/2023 66 >39 mg/dL Final   Triglycerides  Date Value Ref Range Status  12/01/2023 112 0 - 149 mg/dL Final         Passed - Patient is not pregnant      Passed - Valid encounter within last 12 months    Recent Outpatient Visits           3 weeks ago Encounter for general adult medical examination with abnormal findings   [place] [facility] [place], Angeline ORN, NP   1 month ago Postmenopausal vaginal bleeding   [place] [facility] [place], Angeline ORN, NP   6 months ago Prediabetes   [facility] [place],  Angeline ORN, [place]

## 2023-12-28 ENCOUNTER — Other Ambulatory Visit: Payer: Self-pay

## 2024-01-02 ENCOUNTER — Other Ambulatory Visit (HOSPITAL_COMMUNITY): Payer: Self-pay

## 2024-01-02 DIAGNOSIS — M329 Systemic lupus erythematosus, unspecified: Secondary | ICD-10-CM | POA: Diagnosis not present

## 2024-01-02 DIAGNOSIS — Z79899 Other long term (current) drug therapy: Secondary | ICD-10-CM | POA: Diagnosis not present

## 2024-01-02 DIAGNOSIS — D72819 Decreased white blood cell count, unspecified: Secondary | ICD-10-CM | POA: Diagnosis not present

## 2024-01-02 DIAGNOSIS — M1990 Unspecified osteoarthritis, unspecified site: Secondary | ICD-10-CM | POA: Diagnosis not present

## 2024-01-02 MED ORDER — HYDROXYCHLOROQUINE SULFATE 200 MG PO TABS
200.0000 mg | ORAL_TABLET | Freq: Every day | ORAL | 1 refills | Status: DC
  Filled 2024-01-02: qty 90, 90d supply, fill #0

## 2024-01-03 ENCOUNTER — Encounter: Admitting: Internal Medicine

## 2024-01-04 ENCOUNTER — Other Ambulatory Visit (HOSPITAL_COMMUNITY): Payer: Self-pay

## 2024-01-04 DIAGNOSIS — M329 Systemic lupus erythematosus, unspecified: Secondary | ICD-10-CM | POA: Diagnosis not present

## 2024-01-05 ENCOUNTER — Other Ambulatory Visit (HOSPITAL_COMMUNITY): Payer: Self-pay

## 2024-01-05 MED ORDER — FOLIC ACID 1 MG PO TABS
1.0000 mg | ORAL_TABLET | Freq: Every day | ORAL | 1 refills | Status: AC
  Filled 2024-01-05: qty 90, 90d supply, fill #0
  Filled 2024-04-22: qty 90, 90d supply, fill #1

## 2024-01-05 MED ORDER — METHOTREXATE SODIUM 2.5 MG PO TABS
15.0000 mg | ORAL_TABLET | ORAL | 0 refills | Status: DC
  Filled 2024-01-05: qty 24, 28d supply, fill #0

## 2024-01-08 ENCOUNTER — Other Ambulatory Visit: Payer: Self-pay | Admitting: Internal Medicine

## 2024-01-08 ENCOUNTER — Other Ambulatory Visit (HOSPITAL_COMMUNITY): Payer: Self-pay

## 2024-01-08 ENCOUNTER — Other Ambulatory Visit: Payer: Self-pay

## 2024-01-09 ENCOUNTER — Other Ambulatory Visit (HOSPITAL_COMMUNITY): Payer: Self-pay

## 2024-01-09 ENCOUNTER — Encounter: Payer: Self-pay | Admitting: Obstetrics & Gynecology

## 2024-01-09 ENCOUNTER — Other Ambulatory Visit: Payer: Self-pay

## 2024-01-09 ENCOUNTER — Ambulatory Visit: Admitting: Obstetrics & Gynecology

## 2024-01-09 VITALS — BP 107/73 | HR 85 | Ht 67.0 in | Wt 154.2 lb

## 2024-01-09 DIAGNOSIS — R232 Flushing: Secondary | ICD-10-CM | POA: Diagnosis not present

## 2024-01-09 DIAGNOSIS — D485 Neoplasm of uncertain behavior of skin: Secondary | ICD-10-CM | POA: Diagnosis not present

## 2024-01-09 DIAGNOSIS — L308 Other specified dermatitis: Secondary | ICD-10-CM | POA: Diagnosis not present

## 2024-01-09 DIAGNOSIS — M329 Systemic lupus erythematosus, unspecified: Secondary | ICD-10-CM | POA: Diagnosis not present

## 2024-01-09 DIAGNOSIS — Z79890 Hormone replacement therapy (postmenopausal): Secondary | ICD-10-CM | POA: Diagnosis not present

## 2024-01-09 DIAGNOSIS — L932 Other local lupus erythematosus: Secondary | ICD-10-CM | POA: Diagnosis not present

## 2024-01-09 DIAGNOSIS — Z780 Asymptomatic menopausal state: Secondary | ICD-10-CM | POA: Diagnosis not present

## 2024-01-09 DIAGNOSIS — R6882 Decreased libido: Secondary | ICD-10-CM

## 2024-01-09 MED ORDER — NORETHINDRONE 0.35 MG PO TABS
1.0000 | ORAL_TABLET | Freq: Every day | ORAL | 11 refills | Status: AC
  Filled 2024-01-09: qty 28, 28d supply, fill #0
  Filled 2024-02-04: qty 28, 28d supply, fill #1
  Filled 2024-03-04: qty 28, 28d supply, fill #2
  Filled 2024-03-31: qty 28, 28d supply, fill #3
  Filled 2024-04-22: qty 28, 28d supply, fill #4
  Filled 2024-05-08 (×2): qty 84, 84d supply, fill #0

## 2024-01-09 MED ORDER — TESTOSTERONE 1.62 % TD GEL
1.0000 | TRANSDERMAL | 5 refills | Status: AC
  Filled 2024-01-09: qty 75, 30d supply, fill #0
  Filled 2024-01-11: qty 75, 90d supply, fill #0
  Filled 2024-03-21 – 2024-04-22 (×2): qty 75, 90d supply, fill #1

## 2024-01-09 MED ORDER — BELSOMRA 10 MG PO TABS
1.0000 | ORAL_TABLET | Freq: Every day | ORAL | 0 refills | Status: DC
  Filled 2024-01-09: qty 30, 30d supply, fill #0

## 2024-01-09 MED ORDER — ESTRADIOL 0.025 MG/24HR TD PTTW
1.0000 | MEDICATED_PATCH | TRANSDERMAL | 12 refills | Status: AC
  Filled 2024-01-09: qty 8, 28d supply, fill #0
  Filled 2024-02-04: qty 8, 28d supply, fill #1
  Filled 2024-03-04: qty 8, 28d supply, fill #2
  Filled 2024-03-31: qty 8, 28d supply, fill #3
  Filled 2024-04-22: qty 8, 28d supply, fill #4
  Filled 2024-05-08 (×2): qty 24, 84d supply, fill #0

## 2024-01-09 NOTE — Telephone Encounter (Signed)
 Requested medications are due for refill today.  yes  Requested medications are on the active medications list.  yes  Last refill. 12/07/2023 #30 0 rf  Future visit scheduled.   yes  Notes to clinic.  Medication not assigned to a protocol. Please are review for refill.    Requested Prescriptions  Pending Prescriptions Disp Refills   Suvorexant  (BELSOMRA ) 10 MG TABS 30 tablet 0    Sig: Take 1 tablet (10 mg total) by mouth at bedtime.     Off-Protocol Failed - 01/09/2024  1:47 PM      Failed - Medication not assigned to a protocol, review manually.      Passed - Valid encounter within last 12 months    Recent Outpatient Visits           1 month ago Encounter for general adult medical examination with abnormal findings   [place] [facility] [place], Angeline ORN, NP   1 month ago Postmenopausal vaginal bleeding   [place] [facility] [place], Angeline ORN, NP   6 months ago Prediabetes   [facility] [place], Angeline ORN, [place]

## 2024-01-09 NOTE — Progress Notes (Signed)
    GYNECOLOGY PROGRESS NOTE  Subjective:    Patient ID: Cheryl Williams, female    DOB: 12-May-1965, 58 y.o.   MRN: [id number]  HPI  Patient is a 58 y.o. [id number] here to discuss menopause symptoms. Her biggest concern is decreased libido which started about 2-3 years ago, worsening as time goes by. She also has vaginal dryness. She hasn't tried any otc things. She did try Prempro  in July of this year and only took it for 3-4 days but stopped it due to PMB. She had started taking Trispintec from about January - July but was having daily spotting which is why she was switched to Prempro . She reports that her official LMP was 06/2019. She didn't take any HRT/OCPs during that time. I did a pelvic ultrasound and the endometrium was 10 mm. I did a EMBX and pathology was benign.  The following portions of the patient's history were reviewed and updated as appropriate: allergies, current medications, past family history, past medical history, past social history, past surgical history, and problem list.  Review of Systems Pertinent items are noted in HPI.  Pap and mammo were normal 2024. She works for [employer]. Objective:   Blood pressure 107/73, pulse 85, height 5' 7 (1.702 m), weight 154 lb 3.2 oz (69.9 kg), last menstrual period 06/21/2023. Body mass index is 24.15 kg/m.   Assessment:   1. Menopause   2. Hormone replacement therapy (HRT)   3. Hot flash not due to menopause      Plan:   1. Menopause (Primary)   2. Hormone replacement therapy (HRT) - Options of HRT: patches are rec'd due to her elevated lipids/chol. She used vivelle  dot in the past 2023 for about a month with no issue. I have prescribed this along with micronor  to offset the estrogen   3. Hot flash not due to menopause  - FSH   4. Decreased libido- She is not a candidate for Addyi due to postmenopause state - trial of testosterone  gel If she has no improvement in 1 month, then I rec that she stop  taking it.  She will get a mammogram in October.

## 2024-01-10 ENCOUNTER — Ambulatory Visit: Payer: Self-pay | Admitting: Obstetrics & Gynecology

## 2024-01-10 LAB — FOLLICLE STIMULATING HORMONE: FSH: 73.5 m[IU]/mL

## 2024-01-11 ENCOUNTER — Other Ambulatory Visit (HOSPITAL_COMMUNITY): Payer: Self-pay

## 2024-01-12 ENCOUNTER — Telehealth: Payer: Self-pay

## 2024-01-12 ENCOUNTER — Other Ambulatory Visit (HOSPITAL_COMMUNITY): Payer: Self-pay

## 2024-01-12 NOTE — Telephone Encounter (Signed)
 [employer] PA Approved

## 2024-01-16 NOTE — Telephone Encounter (Signed)
-----   Message from Joseph Jorizzo, MD sent at 01/15/2024  6:39 PM EDT ----- Sorry - I saw f/u April - no needs appt with me soon ----- Message ----- From: Waddell Massie Marking, RN Sent: 01/15/2024   2:26 PM EDT To: Joseph Jorizzo, MD  Do you want them schedule with an office visit to discuss or call your cell phone on the designated day? ----- Message ----- From: Joseph Jorizzo, MD Sent: 01/14/2024   3:16 PM EDT To: [facility] Clinical Support  We will need to discuss therapy to add ----- Message ----- From: Lab, Background User Sent: 01/12/2024   2:20 PM EDT To: Joseph Jorizzo, MD

## 2024-01-16 NOTE — Telephone Encounter (Signed)
 Nurse left voice message for patient to return call.

## 2024-01-23 ENCOUNTER — Other Ambulatory Visit (HOSPITAL_COMMUNITY): Payer: Self-pay

## 2024-01-23 MED ORDER — FLUZONE HIGH-DOSE 0.5 ML IM SUSY
0.5000 mL | PREFILLED_SYRINGE | Freq: Once | INTRAMUSCULAR | 0 refills | Status: DC
  Filled 2024-01-23: qty 0.5, 1d supply, fill #0

## 2024-01-23 MED ORDER — FLUZONE 0.5 ML IM SUSY
0.5000 mL | PREFILLED_SYRINGE | Freq: Once | INTRAMUSCULAR | 0 refills | Status: AC
  Filled 2024-01-23: qty 0.5, 1d supply, fill #0

## 2024-01-30 ENCOUNTER — Other Ambulatory Visit (HOSPITAL_COMMUNITY): Payer: Self-pay

## 2024-01-30 DIAGNOSIS — M329 Systemic lupus erythematosus, unspecified: Secondary | ICD-10-CM | POA: Diagnosis not present

## 2024-01-30 MED ORDER — METHOTREXATE SODIUM 2.5 MG PO TABS
15.0000 mg | ORAL_TABLET | ORAL | 3 refills | Status: AC
  Filled 2024-01-30 – 2024-05-08 (×2): qty 72, 84d supply, fill #0

## 2024-01-30 MED ORDER — HYDROXYCHLOROQUINE SULFATE 200 MG PO TABS
200.0000 mg | ORAL_TABLET | ORAL | 3 refills | Status: DC
  Filled 2024-01-30: qty 135, 270d supply, fill #0

## 2024-01-30 MED ORDER — FOLIC ACID 1 MG PO TABS
1.0000 mg | ORAL_TABLET | Freq: Every day | ORAL | 3 refills | Status: AC
  Filled 2024-01-30 – 2024-05-11 (×5): qty 90, 90d supply, fill #0

## 2024-01-31 ENCOUNTER — Other Ambulatory Visit (HOSPITAL_COMMUNITY): Payer: Self-pay

## 2024-01-31 MED ORDER — HYDROXYCHLOROQUINE SULFATE 200 MG PO TABS
200.0000 mg | ORAL_TABLET | ORAL | 3 refills | Status: AC
  Filled 2024-01-31: qty 135, 90d supply, fill #0
  Filled 2024-02-04: qty 135, fill #0
  Filled 2024-03-04: qty 135, 90d supply, fill #0

## 2024-02-04 ENCOUNTER — Other Ambulatory Visit: Payer: Self-pay | Admitting: Internal Medicine

## 2024-02-05 ENCOUNTER — Other Ambulatory Visit (HOSPITAL_COMMUNITY): Payer: Self-pay

## 2024-02-05 ENCOUNTER — Encounter: Payer: Self-pay | Admitting: Internal Medicine

## 2024-02-05 ENCOUNTER — Other Ambulatory Visit: Payer: Self-pay

## 2024-02-05 DIAGNOSIS — Z111 Encounter for screening for respiratory tuberculosis: Secondary | ICD-10-CM

## 2024-02-06 ENCOUNTER — Other Ambulatory Visit: Payer: Self-pay | Admitting: Internal Medicine

## 2024-02-06 ENCOUNTER — Other Ambulatory Visit (HOSPITAL_COMMUNITY): Payer: Self-pay

## 2024-02-06 ENCOUNTER — Other Ambulatory Visit: Payer: Self-pay

## 2024-02-06 DIAGNOSIS — Z1231 Encounter for screening mammogram for malignant neoplasm of breast: Secondary | ICD-10-CM

## 2024-02-06 MED ORDER — BELSOMRA 10 MG PO TABS
1.0000 | ORAL_TABLET | Freq: Every day | ORAL | 0 refills | Status: DC
  Filled 2024-02-06: qty 30, 30d supply, fill #0

## 2024-02-06 NOTE — Telephone Encounter (Signed)
 Requested medications are due for refill today.  yes  Requested medications are on the active medications list.  yes  Last refill. 01/09/2024 #30 0 rf  Future visit scheduled.   Yes - next year  Notes to clinic.  Medication not assigned to a protocol. Please review for refill.    Requested Prescriptions  Pending Prescriptions Disp Refills   Suvorexant  (BELSOMRA ) 10 MG TABS 30 tablet 0    Sig: Take 1 tablet (10 mg total) by mouth at bedtime.     Off-Protocol Failed - 02/06/2024  2:21 PM      Failed - Medication not assigned to a protocol, review manually.      Passed - Valid encounter within last 12 months    Recent Outpatient Visits           2 months ago Encounter for general adult medical examination with abnormal findings   [place] [facility] [place], Angeline ORN, NP   2 months ago Postmenopausal vaginal bleeding    [facility], Angeline ORN, NP   7 months ago Prediabetes   [facility] [place], Angeline ORN, [place]

## 2024-02-07 ENCOUNTER — Other Ambulatory Visit (HOSPITAL_COMMUNITY): Payer: Self-pay

## 2024-02-08 ENCOUNTER — Ambulatory Visit

## 2024-02-13 ENCOUNTER — Telehealth: Payer: Self-pay

## 2024-02-13 ENCOUNTER — Other Ambulatory Visit: Payer: Self-pay

## 2024-02-13 DIAGNOSIS — Z111 Encounter for screening for respiratory tuberculosis: Secondary | ICD-10-CM | POA: Diagnosis not present

## 2024-02-13 NOTE — Telephone Encounter (Signed)
Patient notified order was faxed.

## 2024-02-13 NOTE — Telephone Encounter (Signed)
 Copied from CRM #[id number]. Topic: Clinical - Request for Lab/Test Order >> Feb 13, 2024 10:44 AM Selinda RAMAN wrote: Reason for CRM: The patient called in very frustrated saying her time is very valuable as well as she also is a [employer]. She states she has been standing in [facility] waiting to get a Quantiferon Gold test done but they state they have not received the order even though Epic says the provider sent it. Please resend the order and contact the patient as soon as possible to let her know the order has been sent.

## 2024-02-14 ENCOUNTER — Ambulatory Visit
Admission: RE | Admit: 2024-02-14 | Discharge: 2024-02-14 | Disposition: A | Discharge status: Home / Self Care | Source: Ambulatory Visit | Attending: Internal Medicine | Admitting: Internal Medicine

## 2024-02-14 DIAGNOSIS — Z1231 Encounter for screening mammogram for malignant neoplasm of breast: Secondary | ICD-10-CM | POA: Diagnosis not present

## 2024-02-16 ENCOUNTER — Ambulatory Visit: Payer: Self-pay | Admitting: Internal Medicine

## 2024-02-16 LAB — QUANTIFERON-TB GOLD PLUS
QuantiFERON Mitogen Value: 7.36 [IU]/mL
QuantiFERON Nil Value: 0.06 [IU]/mL
QuantiFERON TB1 Ag Value: 0.08 [IU]/mL
QuantiFERON TB2 Ag Value: 0.06 [IU]/mL
QuantiFERON-TB Gold Plus: NEGATIVE

## 2024-02-21 DIAGNOSIS — Z79899 Other long term (current) drug therapy: Secondary | ICD-10-CM | POA: Diagnosis not present

## 2024-02-21 DIAGNOSIS — D72819 Decreased white blood cell count, unspecified: Secondary | ICD-10-CM | POA: Diagnosis not present

## 2024-02-21 DIAGNOSIS — M1990 Unspecified osteoarthritis, unspecified site: Secondary | ICD-10-CM | POA: Diagnosis not present

## 2024-02-21 DIAGNOSIS — M329 Systemic lupus erythematosus, unspecified: Secondary | ICD-10-CM | POA: Diagnosis not present

## 2024-02-23 ENCOUNTER — Other Ambulatory Visit (HOSPITAL_COMMUNITY): Payer: Self-pay

## 2024-02-23 MED ORDER — DESONIDE 0.05 % EX CREA
1.0000 | TOPICAL_CREAM | Freq: Two times a day (BID) | CUTANEOUS | 5 refills | Status: AC
  Filled 2024-02-23: qty 15, 30d supply, fill #0
  Filled 2024-03-21: qty 60, 30d supply, fill #1

## 2024-03-04 ENCOUNTER — Other Ambulatory Visit: Payer: Self-pay | Admitting: Internal Medicine

## 2024-03-04 ENCOUNTER — Other Ambulatory Visit: Payer: Self-pay

## 2024-03-04 ENCOUNTER — Other Ambulatory Visit (HOSPITAL_COMMUNITY): Payer: Self-pay

## 2024-03-05 NOTE — Telephone Encounter (Signed)
 Requested medications are due for refill today.  yes  Requested medications are on the active medications list.  yes  Last refill. 02/06/2024 #30 0 rf  Future visit scheduled.   yes  Notes to clinic.  Medication not assigned to a protocol. Please review for refill.    Requested Prescriptions  Pending Prescriptions Disp Refills   Suvorexant  (BELSOMRA ) 10 MG TABS 30 tablet 0    Sig: Take 1 tablet (10 mg total) by mouth at bedtime.     Off-Protocol Failed - 03/05/2024  5:46 PM      Failed - Medication not assigned to a protocol, review manually.      Passed - Valid encounter within last 12 months    Recent Outpatient Visits           3 months ago Encounter for general adult medical examination with abnormal findings   [place] [place], Angeline ORN, NP   3 months ago Postmenopausal vaginal bleeding   [place] [facility] [place], Angeline ORN, NP   8 months ago Prediabetes   [facility] [place], Angeline ORN, [place]

## 2024-03-06 ENCOUNTER — Other Ambulatory Visit (HOSPITAL_COMMUNITY): Payer: Self-pay

## 2024-03-06 MED ORDER — BELSOMRA 10 MG PO TABS
1.0000 | ORAL_TABLET | Freq: Every day | ORAL | 0 refills | Status: DC
  Filled 2024-03-06: qty 30, 30d supply, fill #0

## 2024-03-11 ENCOUNTER — Encounter

## 2024-03-21 ENCOUNTER — Other Ambulatory Visit (HOSPITAL_COMMUNITY): Payer: Self-pay

## 2024-03-21 ENCOUNTER — Other Ambulatory Visit: Payer: Self-pay

## 2024-03-21 DIAGNOSIS — Z5181 Encounter for therapeutic drug level monitoring: Secondary | ICD-10-CM | POA: Diagnosis not present

## 2024-03-21 DIAGNOSIS — R7401 Elevation of levels of liver transaminase levels: Secondary | ICD-10-CM | POA: Diagnosis not present

## 2024-03-21 DIAGNOSIS — M329 Systemic lupus erythematosus, unspecified: Secondary | ICD-10-CM | POA: Diagnosis not present

## 2024-03-21 DIAGNOSIS — R748 Abnormal levels of other serum enzymes: Secondary | ICD-10-CM | POA: Diagnosis not present

## 2024-03-21 DIAGNOSIS — R7689 Other specified abnormal immunological findings in serum: Secondary | ICD-10-CM | POA: Diagnosis not present

## 2024-03-21 DIAGNOSIS — D72810 Lymphocytopenia: Secondary | ICD-10-CM | POA: Diagnosis not present

## 2024-03-22 ENCOUNTER — Other Ambulatory Visit (HOSPITAL_COMMUNITY): Payer: Self-pay

## 2024-03-31 ENCOUNTER — Other Ambulatory Visit: Payer: Self-pay | Admitting: Internal Medicine

## 2024-04-01 ENCOUNTER — Other Ambulatory Visit: Payer: Self-pay

## 2024-04-02 NOTE — Telephone Encounter (Signed)
 Requested medication (s) are due for refill today: Yes  Requested medication (s) are on the active medication list: Yes  Last refill:  03/06/24  Future visit scheduled: Yes  Notes to clinic:  Unable to refill due to no refill protocol for this medication.      Requested Prescriptions  Pending Prescriptions Disp Refills   Suvorexant  (BELSOMRA ) 10 MG TABS 30 tablet 0    Sig: Take 1 tablet (10 mg total) by mouth at bedtime.     Off-Protocol Failed - 04/02/2024  4:55 PM      Failed - Medication not assigned to a protocol, review manually.      Passed - Valid encounter within last 12 months    Recent Outpatient Visits           4 months ago Encounter for general adult medical examination with abnormal findings   [place] [facility] [place], Angeline ORN, NP   4 months ago Postmenopausal vaginal bleeding   [place] [facility] [place], Angeline ORN, NP   9 months ago Prediabetes   [place] [facility] [place], Angeline ORN, [place]

## 2024-04-03 ENCOUNTER — Other Ambulatory Visit (HOSPITAL_COMMUNITY): Payer: Self-pay

## 2024-04-03 MED ORDER — BELSOMRA 10 MG PO TABS
1.0000 | ORAL_TABLET | Freq: Every day | ORAL | 0 refills | Status: DC
  Filled 2024-04-03: qty 30, 30d supply, fill #0

## 2024-04-22 ENCOUNTER — Other Ambulatory Visit: Payer: Self-pay | Admitting: Internal Medicine

## 2024-04-22 DIAGNOSIS — A609 Anogenital herpesviral infection, unspecified: Secondary | ICD-10-CM

## 2024-04-23 ENCOUNTER — Other Ambulatory Visit: Payer: Self-pay

## 2024-04-23 ENCOUNTER — Other Ambulatory Visit (HOSPITAL_COMMUNITY): Payer: Self-pay

## 2024-04-24 ENCOUNTER — Other Ambulatory Visit (HOSPITAL_COMMUNITY): Payer: Self-pay

## 2024-04-24 MED ORDER — VALACYCLOVIR HCL 1 G PO TABS
500.0000 mg | ORAL_TABLET | ORAL | 1 refills | Status: AC
  Filled 2024-04-24 – 2024-05-20 (×2): qty 90, 90d supply, fill #0

## 2024-04-24 NOTE — Telephone Encounter (Signed)
 Requested medication (s) are due for refill today: Yes  Requested medication (s) are on the active medication list: Yes  Last refill:  03/29/24  Future visit scheduled: Yes  Notes to clinic:  Manual review.    Requested Prescriptions  Pending Prescriptions Disp Refills   Suvorexant  (BELSOMRA ) 10 MG TABS 30 tablet 0    Sig: Take 1 tablet (10 mg total) by mouth at bedtime.     Off-Protocol Failed - 04/24/2024 11:10 AM      Failed - Medication not assigned to a protocol, review manually.      Passed - Valid encounter within last 12 months    Recent Outpatient Visits           4 months ago Encounter for general adult medical examination with abnormal findings   [place] [facility] [place], Angeline ORN, NP   5 months ago Postmenopausal vaginal bleeding   [place] [facility] [place], Angeline ORN, NP   10 months ago Prediabetes   [place] [facility] [place], Angeline ORN, NP              Signed Prescriptions Disp Refills   valACYclovir  (VALTREX ) 1000 MG tablet 90 tablet 1    Sig: Take 1/2 tablet (500 mg total) by mouth twice daily for 3 days for recurrent outbreaks and 1 tablet daily for suppressive therapy.     Antimicrobials:  Antiviral Agents - Anti-Herpetic Passed - 04/24/2024 11:10 AM      Passed - Valid encounter within last 12 months    Recent Outpatient Visits           4 months ago Encounter for general adult medical examination with abnormal findings   [place] [facility] [place], Angeline ORN, NP   5 months ago Postmenopausal vaginal bleeding   [place] [facility] [place], Angeline ORN, NP   10 months ago Prediabetes   [facility] [place], Angeline ORN, [place]

## 2024-04-24 NOTE — Telephone Encounter (Signed)
 Requested Prescriptions  Pending Prescriptions Disp Refills   valACYclovir  (VALTREX ) 1000 MG tablet 90 tablet 1    Sig: Take 1/2 tablet (500 mg total) by mouth twice daily for 3 days for recurrent outbreaks and 1 tablet daily for suppressive therapy.     Antimicrobials:  Antiviral Agents - Anti-Herpetic Passed - 04/24/2024 11:09 AM      Passed - Valid encounter within last 12 months    Recent Outpatient Visits           4 months ago Encounter for general adult medical examination with abnormal findings   [facility] [place], Angeline ORN, NP   5 months ago Postmenopausal vaginal bleeding   [facility] [place], Angeline ORN, NP   10 months ago Prediabetes   [facility] [place], [place] W, NP               Suvorexant  (BELSOMRA ) 10 MG TABS 30 tablet 0    Sig: Take 1 tablet (10 mg total) by mouth at bedtime.     Off-Protocol Failed - 04/24/2024 11:09 AM      Failed - Medication not assigned to a protocol, review manually.      Passed - Valid encounter within last 12 months    Recent Outpatient Visits           4 months ago Encounter for general adult medical examination with abnormal findings   [facility] [place], Angeline ORN, NP   5 months ago Postmenopausal vaginal bleeding   [facility] [place], Angeline ORN, NP   10 months ago Prediabetes   [facility] [place], Angeline ORN, [place]

## 2024-04-25 ENCOUNTER — Other Ambulatory Visit (HOSPITAL_COMMUNITY): Payer: Self-pay

## 2024-04-25 MED ORDER — BELSOMRA 10 MG PO TABS
1.0000 | ORAL_TABLET | Freq: Every day | ORAL | 0 refills | Status: AC
  Filled 2024-04-25: qty 30, 30d supply, fill #0
  Filled 2024-05-03: qty 90, 90d supply, fill #0
  Filled ????-??-??: fill #0

## 2024-05-02 ENCOUNTER — Other Ambulatory Visit (HOSPITAL_COMMUNITY): Payer: Self-pay

## 2024-05-03 ENCOUNTER — Other Ambulatory Visit (HOSPITAL_COMMUNITY): Payer: Self-pay

## 2024-05-06 ENCOUNTER — Other Ambulatory Visit (HOSPITAL_COMMUNITY): Payer: Self-pay

## 2024-05-07 ENCOUNTER — Other Ambulatory Visit (HOSPITAL_COMMUNITY): Payer: Self-pay

## 2024-05-08 ENCOUNTER — Other Ambulatory Visit (HOSPITAL_COMMUNITY): Payer: Self-pay

## 2024-05-09 ENCOUNTER — Other Ambulatory Visit (HOSPITAL_COMMUNITY): Payer: Self-pay

## 2024-05-11 ENCOUNTER — Other Ambulatory Visit (HOSPITAL_COMMUNITY): Payer: Self-pay

## 2024-05-12 ENCOUNTER — Other Ambulatory Visit (HOSPITAL_COMMUNITY): Payer: Self-pay

## 2024-05-14 ENCOUNTER — Encounter (HOSPITAL_COMMUNITY): Payer: Self-pay

## 2024-05-16 ENCOUNTER — Telehealth: Payer: Self-pay

## 2024-05-16 NOTE — Telephone Encounter (Signed)
 Left message for patient to return call OK  to advise, she has an appointment scheduled to see [facility]. Cheryl Williams does labs after the appointment.

## 2024-05-16 NOTE — Telephone Encounter (Signed)
 Copied from CRM #[id number]. Topic: Clinical - Lab/Test Results >> May 16, 2024 12:03 PM Tonda B wrote: Reason for CRM: patient needs lab order please call pt back  [phone]   [phone]  [phone]

## 2024-05-19 ENCOUNTER — Other Ambulatory Visit: Payer: Self-pay | Admitting: Internal Medicine

## 2024-05-20 ENCOUNTER — Other Ambulatory Visit (HOSPITAL_COMMUNITY): Payer: Self-pay

## 2024-05-20 ENCOUNTER — Other Ambulatory Visit: Payer: Self-pay

## 2024-05-20 ENCOUNTER — Other Ambulatory Visit: Payer: Self-pay | Admitting: Obstetrics & Gynecology

## 2024-05-21 ENCOUNTER — Other Ambulatory Visit (HOSPITAL_COMMUNITY): Payer: Self-pay

## 2024-05-21 ENCOUNTER — Other Ambulatory Visit: Payer: Self-pay

## 2024-05-21 MED ORDER — OMEPRAZOLE 20 MG PO CPDR
20.0000 mg | DELAYED_RELEASE_CAPSULE | Freq: Every day | ORAL | 0 refills | Status: AC
  Filled 2024-05-21: qty 90, 90d supply, fill #0

## 2024-05-21 NOTE — Telephone Encounter (Signed)
 Requested Prescriptions  Pending Prescriptions Disp Refills   omeprazole  (PRILOSEC) 20 MG capsule 90 capsule 0    Sig: Take 1 capsule (20 mg total) by mouth daily.     Gastroenterology: Proton Pump Inhibitors Passed - 05/21/2024 11:48 AM      Passed - Valid encounter within last 12 months    Recent Outpatient Visits           5 months ago Encounter for general adult medical examination with abnormal findings   [facility] [place], Angeline ORN, NP   6 months ago Postmenopausal vaginal bleeding   [facility] [place], Angeline ORN, NP   10 months ago Prediabetes   [facility] [place], Angeline ORN, [place]

## 2024-05-24 ENCOUNTER — Encounter: Payer: Self-pay | Admitting: *Deleted

## 2024-05-24 LAB — HEMOGLOBIN A1C: Hemoglobin A1C: 5.7

## 2024-05-24 LAB — GLUCOSE, POCT (MANUAL RESULT ENTRY): Glucose Fasting, POC: 79 mg/dL (ref 70–99)

## 2024-05-24 NOTE — Progress Notes (Signed)
 Patient attended a screening event on 05/24/24 where her BP was 130/85, blood glucose was 79, and A1C was 5.7. Patient has a PCP, has insurance, and does not smoke. No SDOH needs indicated.

## 2024-05-27 ENCOUNTER — Ambulatory Visit: Admitting: Internal Medicine

## 2024-06-13 ENCOUNTER — Ambulatory Visit: Admitting: Internal Medicine
